# Patient Record
Sex: Female | Born: 1935 | Race: White | Hispanic: No | Smoking: Former smoker
Health system: Southern US, Community
[De-identification: ages and names within clinical notes are randomized; demographics above are authoritative.]

## PROBLEM LIST (undated history)

## (undated) DIAGNOSIS — F419 Anxiety disorder, unspecified: Secondary | ICD-10-CM

## (undated) DIAGNOSIS — I10 Essential (primary) hypertension: Secondary | ICD-10-CM

## (undated) DIAGNOSIS — E785 Hyperlipidemia, unspecified: Secondary | ICD-10-CM

## (undated) DIAGNOSIS — F0390 Unspecified dementia without behavioral disturbance: Secondary | ICD-10-CM

## (undated) DIAGNOSIS — E039 Hypothyroidism, unspecified: Secondary | ICD-10-CM

## (undated) DIAGNOSIS — G4700 Insomnia, unspecified: Secondary | ICD-10-CM

## (undated) DIAGNOSIS — K5909 Other constipation: Secondary | ICD-10-CM

## (undated) HISTORY — PX: TUBAL LIGATION: SHX77

## (undated) HISTORY — DX: Hypothyroidism, unspecified: E03.9

## (undated) HISTORY — DX: Anxiety disorder, unspecified: F41.9

## (undated) HISTORY — DX: Essential (primary) hypertension: I10

## (undated) HISTORY — PX: APPENDECTOMY: SHX54

## (undated) HISTORY — DX: Hyperlipidemia, unspecified: E78.5

## (undated) HISTORY — DX: Other constipation: K59.09

## (undated) HISTORY — DX: Insomnia, unspecified: G47.00

---

## 2003-02-17 ENCOUNTER — Encounter: Payer: Self-pay | Admitting: Family Medicine

## 2003-02-17 ENCOUNTER — Ambulatory Visit (HOSPITAL_COMMUNITY): Admission: RE | Admit: 2003-02-17 | Discharge: 2003-02-17 | Payer: Self-pay | Admitting: Family Medicine

## 2003-03-15 ENCOUNTER — Encounter: Payer: Self-pay | Admitting: Family Medicine

## 2003-03-15 ENCOUNTER — Ambulatory Visit (HOSPITAL_COMMUNITY): Admission: RE | Admit: 2003-03-15 | Discharge: 2003-03-15 | Payer: Self-pay | Admitting: Family Medicine

## 2003-04-16 ENCOUNTER — Ambulatory Visit (HOSPITAL_COMMUNITY): Admission: RE | Admit: 2003-04-16 | Discharge: 2003-04-16 | Payer: Self-pay | Admitting: Family Medicine

## 2003-04-16 ENCOUNTER — Encounter: Payer: Self-pay | Admitting: Family Medicine

## 2005-10-08 HISTORY — PX: OTHER SURGICAL HISTORY: SHX169

## 2005-10-08 HISTORY — PX: COLONOSCOPY: SHX174

## 2006-06-13 ENCOUNTER — Ambulatory Visit (HOSPITAL_COMMUNITY): Admission: RE | Admit: 2006-06-13 | Discharge: 2006-06-13 | Payer: Self-pay | Admitting: Family Medicine

## 2006-07-30 ENCOUNTER — Ambulatory Visit: Payer: Self-pay | Admitting: Internal Medicine

## 2006-07-30 ENCOUNTER — Ambulatory Visit (HOSPITAL_COMMUNITY): Admission: RE | Admit: 2006-07-30 | Discharge: 2006-07-30 | Payer: Self-pay | Admitting: Internal Medicine

## 2006-07-31 ENCOUNTER — Emergency Department (HOSPITAL_COMMUNITY): Admission: EM | Admit: 2006-07-31 | Discharge: 2006-07-31 | Payer: Self-pay | Admitting: Emergency Medicine

## 2006-09-16 ENCOUNTER — Ambulatory Visit (HOSPITAL_COMMUNITY): Admission: RE | Admit: 2006-09-16 | Discharge: 2006-09-16 | Payer: Self-pay | Admitting: Family Medicine

## 2006-09-16 ENCOUNTER — Emergency Department (HOSPITAL_COMMUNITY): Admission: EM | Admit: 2006-09-16 | Discharge: 2006-09-17 | Payer: Self-pay | Admitting: Emergency Medicine

## 2006-10-08 HISTORY — PX: PORT-A-CATH REMOVAL: SHX5289

## 2006-11-21 ENCOUNTER — Ambulatory Visit: Payer: Self-pay | Admitting: Orthopedic Surgery

## 2006-11-26 ENCOUNTER — Encounter (HOSPITAL_COMMUNITY): Admission: RE | Admit: 2006-11-26 | Discharge: 2006-12-26 | Payer: Self-pay | Admitting: Orthopedic Surgery

## 2006-12-02 ENCOUNTER — Ambulatory Visit: Payer: Self-pay | Admitting: Orthopedic Surgery

## 2006-12-06 ENCOUNTER — Ambulatory Visit: Payer: Self-pay | Admitting: Orthopedic Surgery

## 2006-12-06 ENCOUNTER — Ambulatory Visit (HOSPITAL_COMMUNITY): Admission: RE | Admit: 2006-12-06 | Discharge: 2006-12-06 | Payer: Self-pay | Admitting: Orthopedic Surgery

## 2006-12-07 ENCOUNTER — Encounter (HOSPITAL_COMMUNITY): Admission: RE | Admit: 2006-12-07 | Discharge: 2007-01-06 | Payer: Self-pay | Admitting: Orthopedic Surgery

## 2006-12-07 ENCOUNTER — Ambulatory Visit (HOSPITAL_COMMUNITY): Payer: Self-pay | Admitting: Orthopedic Surgery

## 2006-12-10 ENCOUNTER — Ambulatory Visit: Payer: Self-pay | Admitting: Orthopedic Surgery

## 2006-12-11 ENCOUNTER — Encounter (HOSPITAL_COMMUNITY): Admission: RE | Admit: 2006-12-11 | Discharge: 2007-01-10 | Payer: Self-pay | Admitting: Orthopedic Surgery

## 2006-12-17 ENCOUNTER — Ambulatory Visit: Payer: Self-pay | Admitting: Orthopedic Surgery

## 2006-12-24 ENCOUNTER — Ambulatory Visit: Payer: Self-pay | Admitting: Orthopedic Surgery

## 2007-01-07 ENCOUNTER — Encounter (HOSPITAL_COMMUNITY): Admission: RE | Admit: 2007-01-07 | Discharge: 2007-02-06 | Payer: Self-pay | Admitting: Orthopedic Surgery

## 2007-01-08 ENCOUNTER — Ambulatory Visit: Payer: Self-pay | Admitting: Orthopedic Surgery

## 2007-01-13 ENCOUNTER — Encounter (HOSPITAL_COMMUNITY): Admission: RE | Admit: 2007-01-13 | Discharge: 2007-02-12 | Payer: Self-pay | Admitting: Orthopedic Surgery

## 2007-01-22 ENCOUNTER — Ambulatory Visit: Payer: Self-pay | Admitting: Orthopedic Surgery

## 2007-02-13 ENCOUNTER — Ambulatory Visit: Payer: Self-pay | Admitting: Orthopedic Surgery

## 2007-04-02 ENCOUNTER — Ambulatory Visit: Payer: Self-pay | Admitting: Orthopedic Surgery

## 2007-04-09 ENCOUNTER — Ambulatory Visit (HOSPITAL_COMMUNITY): Admission: RE | Admit: 2007-04-09 | Discharge: 2007-04-09 | Payer: Self-pay | Admitting: General Surgery

## 2009-01-27 ENCOUNTER — Encounter (INDEPENDENT_AMBULATORY_CARE_PROVIDER_SITE_OTHER): Payer: Self-pay | Admitting: *Deleted

## 2009-02-14 DIAGNOSIS — I10 Essential (primary) hypertension: Secondary | ICD-10-CM | POA: Insufficient documentation

## 2009-02-14 DIAGNOSIS — E785 Hyperlipidemia, unspecified: Secondary | ICD-10-CM

## 2009-02-15 ENCOUNTER — Encounter: Payer: Self-pay | Admitting: Gastroenterology

## 2009-02-15 ENCOUNTER — Ambulatory Visit: Payer: Self-pay | Admitting: Internal Medicine

## 2009-02-15 DIAGNOSIS — K5900 Constipation, unspecified: Secondary | ICD-10-CM | POA: Insufficient documentation

## 2010-06-26 ENCOUNTER — Ambulatory Visit (HOSPITAL_COMMUNITY): Admission: RE | Admit: 2010-06-26 | Discharge: 2010-06-26 | Payer: Self-pay | Admitting: Internal Medicine

## 2010-09-21 ENCOUNTER — Ambulatory Visit (HOSPITAL_COMMUNITY)
Admission: RE | Admit: 2010-09-21 | Discharge: 2010-09-21 | Payer: Self-pay | Source: Home / Self Care | Attending: Internal Medicine | Admitting: Internal Medicine

## 2010-09-21 ENCOUNTER — Encounter (INDEPENDENT_AMBULATORY_CARE_PROVIDER_SITE_OTHER): Payer: Self-pay | Admitting: Internal Medicine

## 2010-12-21 ENCOUNTER — Ambulatory Visit (INDEPENDENT_AMBULATORY_CARE_PROVIDER_SITE_OTHER): Payer: Medicare HMO | Admitting: Otolaryngology

## 2010-12-21 DIAGNOSIS — R490 Dysphonia: Secondary | ICD-10-CM

## 2011-01-18 ENCOUNTER — Ambulatory Visit (INDEPENDENT_AMBULATORY_CARE_PROVIDER_SITE_OTHER): Payer: Medicare HMO | Admitting: Otolaryngology

## 2011-01-18 DIAGNOSIS — R490 Dysphonia: Secondary | ICD-10-CM

## 2011-02-20 NOTE — Op Note (Signed)
NAMEAIREN, Robin Grant NO.:  [id number]   MEDICAL RECORD NO.:  [id number]          PATIENT TYPE:  AMB   LOCATION:  DAY                           FACILITY:  [facility]   PHYSICIAN:  Barbaraann Barthel, M.D. DATE OF BIRTH:  Feb 22, 1931   DATE OF PROCEDURE:  04/09/2007  DATE OF DISCHARGE:                               OPERATIVE REPORT   PROCEDURE:  Removal of right subclavian vein Port-A-Cath.   NOTE:  This is a 77 year old white female who had undergone placement of  a Port-A-Cath for treatment of osteoarthritis of the right ankle.  She  has finished this treatment, and she was referred back for the removal  of Port-A-Cath.   SPECIMEN:  Port-A-Cath.   GROSS OPERATIVE FINDINGS:  Normal pseudocapsule around the Port-A-Cath,  no infection.   TECHNIQUE:  The patient was placed in a supine position, and after  prepping the right hemithorax with Betadine solution and draping in the  usual manner, 1% Xylocaine without epinephrine was used to anesthetize  the area around Port-A-Cath.  Skin and subcutaneous tissue was incised  Port-A-Cath was removed through the incision, clamping the capsule  around the Silastic catheter and ligating this with 4-0 Polysorb.  We  then irrigated with sterile Xylocaine and then closed the skin with 4-0  Polysorb in a subcuticular fashion.  The 1/8-inch Steri-Strips were used  to approximate the skin with a 2 x 2 and Neosporin and an OpSite  dressing applied.  Prior to closure, all sponge, needles and instrument  counts were found to be correct.  Estimated blood loss was minimal.  The  patient is to follow-up with [place] in the office in the morning.  There  were no complications.   It should be noted the patient was given Cipro preoperatively.  She had  a little rash, and this was discontinued.  There were no respiratory  problems or other untoward problems.  She was given Benadryl.  We will  follow up with her in the morning.      Barbaraann Barthel, M.D.  Electronically Signed     WB/MEDQ  D:  04/09/2007  T:  04/09/2007  Job:  [id number]   cc:   Barbaraann Barthel, M.D.  Fax: [phone]   Vickki Hearing, M.D.  Fax: [phone]

## 2011-02-23 NOTE — Op Note (Signed)
Robin Grant, Robin Grant NO.:  [id number]   MEDICAL RECORD NO.:  [id number]          PATIENT TYPE:  AMB   LOCATION:  DAY                           FACILITY:  [facility]   PHYSICIAN:  Barbaraann Barthel, M.D. DATE OF BIRTH:  [birth year]   DATE OF PROCEDURE:  12/06/2006  DATE OF DISCHARGE:  12/06/2006                               OPERATIVE REPORT   COMMENT:  This is the second dictation of this operative summary;  apparently, the first dictation was lost in transcription.   SURGEON:  Barbaraann Barthel, M.D.   PREOPERATIVE DIAGNOSIS:  Osteomyelitis of right ankle.   POSTOPERATIVE DIAGNOSIS:  Osteomyelitis of right ankle.   PROCEDURE:  Placement of right subclavian Port-A-Cath under fluoroscopy.   NOTE:  This is a 77 year old white female who was referred from Dr.  Romeo Apple for placement of a Port-A-Cath for long-term antibiotic  treatments for the osteomyelitis of her right ankle.  We discussed  preoperatively the surgery with her, discussing complications not  limited to, but including bleeding, infection and pneumothorax and  catheter embolization.  Informed consent was obtained.   GROSS OPERATIVE FINDINGS:  The patient had a Port-A-Cath placed under  fluoroscopy via the right subclavian vein with good position of the  catheter at the end of the procedure with no pneumothorax on followup  chest x-ray.   TECHNIQUE:  The patient was placed in Trendelenburg position.  After  prepping the right hemithorax with Betadine solution and draping in the  usual manner, an area below her clavicle on the right side was  anesthetized with 1% Xylocaine without epinephrine.  An 18-gauge needle  was used to cannulate the right subclavian vein and a guidewire was  placed under fluoroscopy in good position.  After that, we placed over  the guidewire a vein dilator and the plastic sleeve was then placed  within the vein and then through that, the Silastic catheter was  threaded.  This  was in good position.  We did this under fluoroscopy.  The length of the catheter was then trimmed to an appropriate length to  leave the tip the catheter at the superior vena cava level.  We then  anesthetize the subcutaneous pocket and placed the Port-A-Cath infusion  device, which had been connected to the catheter, in its proper  position.  Prior to connecting, we had irrigated this with heparinized  saline solution and removed all air bubbles with appropriate aspiration.  The subcu pocket was irrigated with normal saline solution.  The  subcutaneous layer was closed with 4-0 Polysorb and the skin was  approximated with a subcuticular 5-0 Polysorb suture.  Steri-Strips and  Neosporin and an OpSite dressing were applied.  Prior to closure, all  sponge, needle and instrument counts were found to be correct.  Estimated blood loss was minimal.  The patient tolerated the procedure  well.  In the recovery room, a chest x-ray was performed and this  revealed the catheter in good position without the presence of a  pneumothorax.   This patient will be followed up by Dr. Romeo Apple for routine catheter  care, also with the specialty clinics for a routine catheter care and  antibiotic placement.  I will see this patient again perioperatively and  for catheter removal.      Barbaraann Barthel, M.D.  Electronically Signed     WB/MEDQ  D:  12/16/2006  T:  12/17/2006  Job:  [id number]   cc:   Vickki Hearing, M.D.  Fax: [phone]   [facility]

## 2011-02-23 NOTE — Op Note (Signed)
NAMEGENNETTE, SHADIX NO.:  [id number]   MEDICAL RECORD NO.:  [id number]          PATIENT TYPE:  AMB   LOCATION:  DAY                           FACILITY:  [facility]   PHYSICIAN:  R. Roetta Sessions, M.D. DATE OF BIRTH:  1930-12-30   DATE OF PROCEDURE:  07/30/2006  DATE OF DISCHARGE:                                 OPERATIVE REPORT   Screening colonoscopy.   INDICATIONS FOR PROCEDURE:  The patient is a 75-year lady referred at the  courtesy of Dr. Oval Linsey for colorectal cancer screening.  She has  no lower GI tract symptoms.  She has never had her lower GI tract imaged.  There is family history of colorectal placed.  Colonoscopy is now being  done.  This approach has been discussed with the patient at length.  Potential risks, benefits and alternatives have been reviewed, questions  answered.  She is agreeable. Please see documentation in the medical record.   PROCEDURE NOTE:  O2 saturation, blood pressure, pulse and respirations were  monitored throughout the entire procedure.  Conscious sedation Versed 3 mg  IV and Demerol 75 mg IV in divided doses.   INSTRUMENT:  Olympus video chip system.   FINDINGS:  Digital rectal exam revealed no abnormalities.   ENDOSCOPIC FINDINGS:  The prep was good.   Rectum:  Examination of the rectal mucosa including retroflexed view of anal  verge revealed no abnormalities.   Colon:  Colonic mucosa surveyed from rectosigmoid junction to the left,  transverse, and right colon to the area of the appendiceal orifice,  ileocecal valve and cecum.  These structures were well seen and photographed  for the record.  From this level, the scope was withdrawn, and all  previously mucosal surfaces were again seen.  The patient had large-mouth  pan colonic diverticula.  The remainder colon mucosa appeared normal.  The  patient tolerated the procedure well and was reactive to endoscopy.   IMPRESSION:  1. Normal rectum.  2. Pan colonic  diverticula.  Colon mucosa appeared normal.   RECOMMENDATIONS:  1. Diverticulosis literature provided to Ms. Vanderwerf.  2. Daily fiber supplement recommended  3. Repeat screening colonoscopy in 10 years.      Jonathon Bellows, M.D.  Electronically Signed     RMR/MEDQ  D:  07/30/2006  T:  07/31/2006  Job:  [id number]   cc:   Melvyn Novas, MD  Fax: [phone]

## 2011-02-23 NOTE — Op Note (Signed)
[name], FREUNDLICH NO.:  [id number]   MEDICAL RECORD NO.:  [id number]          PATIENT TYPE:  AMB   LOCATION:  DAY                           FACILITY:  [facility]   PHYSICIAN:  Vickki Hearing, M.D.DATE OF BIRTH:  Mar 24, 1931   DATE OF PROCEDURE:  12/06/2006  DATE OF DISCHARGE:                               OPERATIVE REPORT   PREOPERATIVE DIAGNOSIS:  Osteomyelitis right fibula.   POSTOPERATIVE DIAGNOSIS:  Osteomyelitis right fibula.   PROCEDURE:  Incision, drainage, irrigation, debridement, bone biopsy,  bone culture and regular wound culture, right fibula.   SURGEON:  Vickki Hearing, M.D., no assistant.   ANESTHETIC:  General.   OPERATIVE FINDINGS:  No purulent drainage, open wound over the fibula  which was exposed to air, which is infection by definition.   HISTORY:  77 year old female scraped the skin over the right fibula in  summary 2007, since that time had intermittent redness, swelling,  tenderness with the wound that would scab over, fall off and then open  back up.  She has tried two antibiotics, Keflex and sulfa which did not  clear this up and presented to me for evaluation on 02/14 at the request  a Oval Linsey, MD.  Workup included a bone scan which showed  chronic osteomyelitis of the fibula.  I recommendation she undergo  surgical treatment and IV antibiotics for 6 weeks.  Dr. Malvin Johns has  placed a right Port-A-Cath to assist [place] with that.   PROCEDURE:  The patient was identified as Robin Grant.  The right leg  was marked for surgery, countersigned by the surgeon.  She was taken to  the operating room where she had a Port-A-Cath placed.  After this, with  the patient under anesthesia, the right leg was prepped and draped in  sterile technique.  The time-out procedure was completed after sterile  draping.   Under no tourniquet, skin edges with of the open wound were debrided.  Incision was extended proximally and distally.   Subperiosteal dissection  exposed the remaining portion of the fibula.  The exposed portion of  bone was removed with an osteotome in the sagittal plane and sent for  culture.  Wound cultures were obtained.  Wound was irrigated with about  a liter and a half of saline.  The extensions of the wound were closed  with 3-0 nylon.  The open wound was left open and packed with wet-to-dry  dressing.  The foot was wrapped.  The patient was extubated and taken to  recovery room in stable condition.   POSTOP PLAN:  The patient is to see Dr. Malvin Johns on Monday for at 10:00  a.m. for evaluation and postop visit for the placement of the catheter.  She will also see me on Monday for dressing change.  Cultures are  pending.  We will wait those cultures before we start definitive  antibiotics.  We will allow her to get IV ceftazidime starting over the  weekend.      Vickki Hearing, M.D.  Electronically Signed     SEH/MEDQ  D:  12/06/2006  T:  12/06/2006  Job:  [id number]

## 2011-05-02 ENCOUNTER — Ambulatory Visit: Payer: Medicare HMO | Admitting: Gastroenterology

## 2011-05-03 ENCOUNTER — Encounter: Payer: Self-pay | Admitting: Gastroenterology

## 2011-05-03 ENCOUNTER — Ambulatory Visit (INDEPENDENT_AMBULATORY_CARE_PROVIDER_SITE_OTHER): Payer: Medicare HMO | Admitting: Gastroenterology

## 2011-05-03 ENCOUNTER — Ambulatory Visit (HOSPITAL_COMMUNITY)
Admission: RE | Admit: 2011-05-03 | Discharge: 2011-05-03 | Disposition: A | Discharge status: Home / Self Care | Payer: Medicare HMO | Source: Ambulatory Visit | Attending: Gastroenterology | Admitting: Gastroenterology

## 2011-05-03 DIAGNOSIS — K5900 Constipation, unspecified: Secondary | ICD-10-CM

## 2011-05-03 DIAGNOSIS — K219 Gastro-esophageal reflux disease without esophagitis: Secondary | ICD-10-CM | POA: Insufficient documentation

## 2011-05-03 DIAGNOSIS — R109 Unspecified abdominal pain: Secondary | ICD-10-CM

## 2011-05-03 DIAGNOSIS — K5730 Diverticulosis of large intestine without perforation or abscess without bleeding: Secondary | ICD-10-CM | POA: Insufficient documentation

## 2011-05-03 DIAGNOSIS — R9389 Abnormal findings on diagnostic imaging of other specified body structures: Secondary | ICD-10-CM | POA: Insufficient documentation

## 2011-05-03 DIAGNOSIS — R1314 Dysphagia, pharyngoesophageal phase: Secondary | ICD-10-CM

## 2011-05-03 DIAGNOSIS — R1032 Left lower quadrant pain: Secondary | ICD-10-CM | POA: Insufficient documentation

## 2011-05-03 DIAGNOSIS — J387 Other diseases of larynx: Secondary | ICD-10-CM

## 2011-05-03 DIAGNOSIS — R1310 Dysphagia, unspecified: Secondary | ICD-10-CM | POA: Insufficient documentation

## 2011-05-03 LAB — COMPREHENSIVE METABOLIC PANEL
AST: 18 U/L (ref 0–37)
BUN: 20 mg/dL (ref 6–23)
Calcium: 10 mg/dL (ref 8.4–10.5)
Chloride: 103 mEq/L (ref 96–112)
Glucose, Bld: 93 mg/dL (ref 70–99)

## 2011-05-03 LAB — LIPASE: Lipase: 52 U/L (ref 11–59)

## 2011-05-03 LAB — CBC WITH DIFFERENTIAL/PLATELET
Eosinophils Relative: 3 % (ref 0–5)
Lymphocytes Relative: 31 % (ref 12–46)
Lymphs Abs: 1.7 10*3/uL (ref 0.7–4.0)
MCH: 32.1 pg (ref 26.0–34.0)
MCHC: 34.8 g/dL (ref 30.0–36.0)
MCV: 92.3 fL (ref 78.0–100.0)
Monocytes Absolute: 0.6 10*3/uL (ref 0.1–1.0)
Platelets: 228 10*3/uL (ref 150–400)

## 2011-05-03 MED ORDER — IOHEXOL 300 MG/ML  SOLN
80.0000 mL | Freq: Once | INTRAMUSCULAR | Status: AC | PRN
  Administered 2011-05-03: 80 mL via INTRAVENOUS

## 2011-05-03 NOTE — Assessment & Plan Note (Signed)
Refer to assessment and plan for LPR.

## 2011-05-03 NOTE — Assessment & Plan Note (Addendum)
ENT findings suggestive of LPR. Major complaint of hoarseness. No improvement on omeprazole 20 mg twice a day. She may benefit from different PPI, stronger dose. Continue omeprazole until EGD. She also has esophageal dysphagia. Recommend EGD with possible dilation in the near future. Will await workup of abdominal pain prior to scheduling.

## 2011-05-03 NOTE — Assessment & Plan Note (Signed)
Resume MiraLax 17 g daily.

## 2011-05-03 NOTE — Progress Notes (Signed)
Primary Care Physician:  [name], MD  Primary Gastroenterologist: Roetta Sessions, MD    Chief Complaint  Patient presents with  . Nausea  . Gastrophageal Reflux  . Abdominal Pain    HPI:  Robin Grant is a 77 y.o. female here for further evaluation of GERD/LPR. Diagnosed with LPR by Dr. Suszanne Conners in 12/2010.  On laryngoscopy, she had severely edematous arytenoid mucosa, severe edema of posterior commissure, true vocal folds were pale and severely edematous and diffuse erythema of posterior larynx. Started on omeprazole 20mg  po bid. Follow-up laryngoscopy four weeks later essentially unchanged. She also c/o intermittent nausea for years. Associated with dizziness. Meclinzine helps.  C/O intermittent dysphagia to liquids, solids, pills. Has to swallow twice to get it down.  Mostly concerned about left abdominal pain which radiates into back. Present for last couple months, hurts constantly. Last night pain severe. Today, hurt in car when hit a bump. No dysuria or hematuria. Has urgency to urinate but then trouble going.  Chronic constipation. Take occasional stool softner, twice per week. Recently stools more loose. BM 2-3 per week. No melena, brbpr.    Current Outpatient Prescriptions  Medication Sig Dispense Refill  . acetaminophen (TYLENOL) 325 MG tablet Take 650 mg by mouth every 6 (six) hours as needed.        Marland Kitchen aspirin 81 MG tablet Take 81 mg by mouth daily.        Marland Kitchen levothyroxine (SYNTHROID, LEVOTHROID) 50 MCG tablet Take 50 mcg by mouth daily.        Marland Kitchen losartan (COZAAR) 100 MG tablet Take 100 mg by mouth daily.        . Multiple Vitamin (MULTIVITAMIN) capsule Take 1 capsule by mouth daily.        Marland Kitchen omeprazole (PRILOSEC) 20 MG capsule Take 20 mg by mouth 2 (two) times daily.       . rosuvastatin (CRESTOR) 10 MG tablet Take 10 mg by mouth daily.        Marland Kitchen olmesartan (BENICAR) 40 MG tablet Take 40 mg by mouth daily.        . polyethylene glycol (MIRALAX / GLYCOLAX) packet Take 17 g by mouth  daily.          Allergies as of 05/03/2011 - Review Complete 05/03/2011  Allergen Reaction Noted  . Hydrocodone  05/03/2011    Past Medical History  Diagnosis Date  . Hyperlipidemia   . HTN (hypertension)   . Chronic constipation   . Anxiety   . Hypothyroidism   . Insomnia     Past Surgical History  Procedure Date  . Tubal ligation   . Appendectomy   . Colonoscopy 2007    Dr. [name], pancolonic diverticula.  . Right ankle surgery 2007    osteomyelitis  . Port-a-cath removal 2008    Family History  Problem Relation Age of Onset  . Breast cancer Mother     deceased from MI  . Colon cancer Maternal Aunt   . Stroke Father     MI too  . Rheumatic fever Sister 68    open heart surgery age 91, deceased age 26  . Lung cancer Sister   . Heart attack Sister   . Kidney disease Sister     born with one kidney, required transplant    History   Social History  . Marital Status: Married    Spouse Name: N/A    Number of Children: 2  . Years of Education: N/A   Occupational History  .  retired    Social History Main Topics  . Smoking status: Never Smoker   . Smokeless tobacco: Not on file  . Alcohol Use: No  . Drug Use: No  . Sexually Active: Not on file   Other Topics Concern  . Not on file   Social History Narrative  . No narrative on file      ROS:  General: Negative for anorexia, weight loss, fever, chills, fatigue, weakness. Eyes: Negative for vision changes.  ENT: Negative for nasal congestion. See HPI. CV: Negative for chest pain, angina, palpitations, dyspnea on exertion, peripheral edema.  Respiratory: Negative for dyspnea at rest, dyspnea on exertion, cough, sputum, wheezing.  GI: See history of present illness. GU:  See HPI.  MS: Negative for joint pain, low back pain.  Derm: Negative for rash or itching.  Neuro: Negative for weakness, abnormal sensation, seizure, frequent headaches, memory loss, confusion.  Psych: Negative for anxiety,  depression, suicidal ideation, hallucinations.  Endo: Negative for unusual weight change.  Heme: Negative for bruising or bleeding. Allergy: Negative for rash or hives.    Physical Examination:  BP 122/80  Pulse 84  Temp(Src) 97.6 F (36.4 C) (Temporal)  Ht 5\' 3"  (1.6 m)  Wt 124 lb 9.6 oz (56.518 kg)  BMI 22.07 kg/m2   General: Well-nourished, well-developed in no acute distress.  Head: Normocephalic, atraumatic.   Eyes: Conjunctiva pink, no icterus. Mouth: Oropharyngeal mucosa moist and pink , no lesions erythema or exudate. Neck: Supple without thyromegaly, masses, or lymphadenopathy.  Lungs: Clear to auscultation bilaterally.  Heart: Regular rate and rhythm, no murmurs rubs or gallops.  Abdomen: Bowel sounds are normal, nondistended, no hepatosplenomegaly or masses, no abdominal bruits or hernia. Left sided tenderness in LUQ down flank to LLQ without rebound or guarding. No CVA tenderness.  Rectal: Not performed Extremities: No lower extremity edema, no clubbing. Arthritic changes of hands.  Neuro: Alert and oriented x 4 , grossly normal neurologically.  Skin: Warm and dry, no rash or jaundice.   Psych: Alert and cooperative, normal mood and affect.  Labs: 10/2010: BUN 10, Creatinine 1.3.  Imaging Studies: No results found.

## 2011-05-03 NOTE — Progress Notes (Signed)
Cc to PCP 

## 2011-05-03 NOTE — Assessment & Plan Note (Signed)
Worsening left-sided abdominal pain. Etiology unclear. Stat labs and CT of abdomen and pelvis with IV and oral contrast. Radiology has been notified that the patient's last creatinine was 1.3 back in January 2012. Further recommendations to follow.

## 2011-05-07 NOTE — Progress Notes (Signed)
I called [facility] for an urgent appt.  They didn't have any appts in [place] so I had her scheduled to see Dr. Margarita Grizzle in [place] 05/08/2011@10 :00am.    Pt was also scheduled for egd 05/15/2011@10 :20am   Pt declined all these appts and stated she was feeling better if she changes her mind she will call back.   All called and cx all appts.

## 2011-05-07 NOTE — Progress Notes (Signed)
Cc Labs to PCP

## 2011-05-15 ENCOUNTER — Other Ambulatory Visit: Payer: Self-pay | Admitting: General Practice

## 2011-05-15 ENCOUNTER — Encounter: Payer: Medicare HMO | Admitting: Internal Medicine

## 2011-05-15 DIAGNOSIS — R109 Unspecified abdominal pain: Secondary | ICD-10-CM

## 2011-05-15 DIAGNOSIS — K219 Gastro-esophageal reflux disease without esophagitis: Secondary | ICD-10-CM

## 2011-05-21 ENCOUNTER — Ambulatory Visit (HOSPITAL_COMMUNITY)
Admission: RE | Admit: 2011-05-21 | Discharge: 2011-05-21 | Disposition: A | Discharge status: Home / Self Care | Payer: Medicare HMO | Source: Ambulatory Visit | Attending: Internal Medicine | Admitting: Internal Medicine

## 2011-05-21 ENCOUNTER — Encounter (HOSPITAL_COMMUNITY): Payer: Self-pay | Admitting: *Deleted

## 2011-05-21 ENCOUNTER — Encounter (HOSPITAL_COMMUNITY)
Admission: RE | Disposition: A | Discharge status: Home / Self Care | Payer: Self-pay | Source: Ambulatory Visit | Attending: Internal Medicine

## 2011-05-21 DIAGNOSIS — K219 Gastro-esophageal reflux disease without esophagitis: Secondary | ICD-10-CM

## 2011-05-21 DIAGNOSIS — R1032 Left lower quadrant pain: Secondary | ICD-10-CM | POA: Insufficient documentation

## 2011-05-21 DIAGNOSIS — Z01812 Encounter for preprocedural laboratory examination: Secondary | ICD-10-CM | POA: Insufficient documentation

## 2011-05-21 DIAGNOSIS — K449 Diaphragmatic hernia without obstruction or gangrene: Secondary | ICD-10-CM

## 2011-05-21 DIAGNOSIS — Z79899 Other long term (current) drug therapy: Secondary | ICD-10-CM | POA: Insufficient documentation

## 2011-05-21 DIAGNOSIS — K31819 Angiodysplasia of stomach and duodenum without bleeding: Secondary | ICD-10-CM

## 2011-05-21 DIAGNOSIS — K5900 Constipation, unspecified: Secondary | ICD-10-CM

## 2011-05-21 DIAGNOSIS — Z7982 Long term (current) use of aspirin: Secondary | ICD-10-CM | POA: Insufficient documentation

## 2011-05-21 DIAGNOSIS — R1310 Dysphagia, unspecified: Secondary | ICD-10-CM | POA: Insufficient documentation

## 2011-05-21 DIAGNOSIS — R109 Unspecified abdominal pain: Secondary | ICD-10-CM

## 2011-05-21 DIAGNOSIS — I10 Essential (primary) hypertension: Secondary | ICD-10-CM | POA: Insufficient documentation

## 2011-05-21 DIAGNOSIS — E785 Hyperlipidemia, unspecified: Secondary | ICD-10-CM | POA: Insufficient documentation

## 2011-05-21 HISTORY — PX: ESOPHAGOGASTRODUODENOSCOPY: SHX5428

## 2011-05-21 LAB — HEPATIC FUNCTION PANEL
ALT: 9 U/L (ref 0–35)
AST: 15 U/L (ref 0–37)
Albumin: 3.6 g/dL (ref 3.5–5.2)
Bilirubin, Direct: 0.2 mg/dL (ref 0.0–0.3)
Total Protein: 6.7 g/dL (ref 6.0–8.3)

## 2011-05-21 LAB — CREATININE, SERUM
GFR calc Af Amer: 52 mL/min — ABNORMAL LOW (ref >60)
GFR calc non Af Amer: 43 mL/min — ABNORMAL LOW (ref >60)

## 2011-05-21 LAB — BUN: BUN: 14 mg/dL (ref 6–23)

## 2011-05-21 SURGERY — EGD (ESOPHAGOGASTRODUODENOSCOPY)
Anesthesia: Moderate Sedation

## 2011-05-21 MED ORDER — BUTAMBEN-TETRACAINE-BENZOCAINE 2-2-14 % EX AERO
INHALATION_SPRAY | CUTANEOUS | Status: DC | PRN
  Administered 2011-05-21: 2 via TOPICAL

## 2011-05-21 MED ORDER — MEPERIDINE HCL 100 MG/ML IJ SOLN
INTRAMUSCULAR | Status: DC | PRN
  Administered 2011-05-21: 50 mg via INTRAVENOUS

## 2011-05-21 MED ORDER — SODIUM CHLORIDE 0.45 % IV SOLN
Freq: Once | INTRAVENOUS | Status: AC
  Administered 2011-05-21: 08:00:00 via INTRAVENOUS

## 2011-05-21 MED ORDER — MIDAZOLAM HCL 5 MG/5ML IJ SOLN
INTRAMUSCULAR | Status: DC | PRN
  Administered 2011-05-21: 2 mg via INTRAVENOUS

## 2011-05-21 MED ORDER — MIDAZOLAM HCL 5 MG/5ML IJ SOLN
INTRAMUSCULAR | Status: AC
  Filled 2011-05-21: qty 10

## 2011-05-21 MED ORDER — MEPERIDINE HCL 100 MG/ML IJ SOLN
INTRAMUSCULAR | Status: AC
  Filled 2011-05-21: qty 2

## 2011-05-21 NOTE — H&P (Addendum)
[name], PA  05/03/2011  3:38 PM  Signed Primary Care Physician:  [name], MD   Primary Gastroenterologist: Roetta Sessions, MD      Chief Complaint   Patient presents with   .  Nausea   .  Gastrophageal Reflux   .  Abdominal Pain      HPI:  Robin Grant is a 77 y.o. female here for further evaluation of GERD/LPR. Diagnosed with LPR by Dr. Suszanne Conners in 12/2010.  On laryngoscopy, she had severely edematous arytenoid mucosa, severe edema of posterior commissure, true vocal folds were pale and severely edematous and diffuse erythema of posterior larynx. Started on omeprazole 20mg  po bid. Follow-up laryngoscopy four weeks later essentially unchanged. She also c/o intermittent nausea for years. Associated with dizziness. Meclinzine helps.  C/O intermittent dysphagia to liquids, solids, pills. Has to swallow twice to get it down.   Mostly concerned about left abdominal pain which radiates into back. Present for last couple months, hurts constantly. Last night pain severe. Today, hurt in car when hit a bump. No dysuria or hematuria. Has urgency to urinate but then trouble going.  Chronic constipation. Take occasional stool softner, twice per week. Recently stools more loose. BM 2-3 per week. No melena, brbpr.       Current Outpatient Prescriptions   Medication  Sig  Dispense  Refill   .  acetaminophen (TYLENOL) 325 MG tablet  Take 650 mg by mouth every 6 (six) hours as needed.           Marland Kitchen  aspirin 81 MG tablet  Take 81 mg by mouth daily.           Marland Kitchen  levothyroxine (SYNTHROID, LEVOTHROID) 50 MCG tablet  Take 50 mcg by mouth daily.           Marland Kitchen  losartan (COZAAR) 100 MG tablet  Take 100 mg by mouth daily.           .  Multiple Vitamin (MULTIVITAMIN) capsule  Take 1 capsule by mouth daily.           Marland Kitchen  omeprazole (PRILOSEC) 20 MG capsule  Take 20 mg by mouth 2 (two) times daily.          .  rosuvastatin (CRESTOR) 10 MG tablet  Take 10 mg by mouth daily.           Marland Kitchen  olmesartan (BENICAR) 40 MG  tablet  Take 40 mg by mouth daily.           .  polyethylene glycol (MIRALAX / GLYCOLAX) packet  Take 17 g by mouth daily.               Allergies as of 05/03/2011 - Review Complete 05/03/2011   Allergen  Reaction  Noted   .  Hydrocodone    05/03/2011       Past Medical History   Diagnosis  Date   .  Hyperlipidemia     .  HTN (hypertension)     .  Chronic constipation     .  Anxiety     .  Hypothyroidism     .  Insomnia         Past Surgical History   Procedure  Date   .  Tubal ligation     .  Appendectomy     .  Colonoscopy  2007       Dr. [name], pancolonic diverticula.   .  Right ankle surgery  2007  osteomyelitis   .  Port-a-cath removal  2008       Family History   Problem  Relation  Age of Onset   .  Breast cancer  Mother         deceased from MI   .  Colon cancer  Maternal Aunt     .  Stroke  Father         MI too   .  Rheumatic fever  Sister  10       open heart surgery age 17, deceased age 21   .  Lung cancer  Sister     .  Heart attack  Sister     .  Kidney disease  Sister         born with one kidney, required transplant       History       Social History   .  Marital Status:  Married       Spouse Name:  N/A       Number of Children:  2   .  Years of Education:  N/A       Occupational History   .   retired         Social History Main Topics   .  Smoking status:  Never Smoker    .  Smokeless tobacco:  Not on file   .  Alcohol Use:  No   .  Drug Use:  No   .  Sexually Active:  Not on file       Other Topics  Concern   .  Not on file       Social History Narrative   .  No narrative on file        ROS:   General: Negative for anorexia, weight loss, fever, chills, fatigue, weakness. Eyes: Negative for vision changes.   ENT: Negative for nasal congestion. See HPI. CV: Negative for chest pain, angina, palpitations, dyspnea on exertion, peripheral edema.   Respiratory: Negative for dyspnea at rest, dyspnea on exertion, cough,  sputum, wheezing.   GI: See history of present illness. GU:  See HPI.   MS: Negative for joint pain, low back pain.   Derm: Negative for rash or itching.   Neuro: Negative for weakness, abnormal sensation, seizure, frequent headaches, memory loss, confusion.   Psych: Negative for anxiety, depression, suicidal ideation, hallucinations.   Endo: Negative for unusual weight change.   Heme: Negative for bruising or bleeding. Allergy: Negative for rash or hives.     Physical Examination:   BP 122/80  Pulse 84  Temp(Src) 97.6 F (36.4 C) (Temporal)  Ht 5\' 3"  (1.6 m)  Wt 124 lb 9.6 oz (56.518 kg)  BMI 22.07 kg/m2    General: Well-nourished, well-developed in no acute distress.   Head: Normocephalic, atraumatic.    Eyes: Conjunctiva pink, no icterus. Mouth: Oropharyngeal mucosa moist and pink , no lesions erythema or exudate. Neck: Supple without thyromegaly, masses, or lymphadenopathy.   Lungs: Clear to auscultation bilaterally.   Heart: Regular rate and rhythm, no murmurs rubs or gallops.   Abdomen: Bowel sounds are normal, nondistended, no hepatosplenomegaly or masses, no abdominal bruits or hernia. Left sided tenderness in LUQ down flank to LLQ without rebound or guarding. No CVA tenderness.   Rectal: Not performed Extremities: No lower extremity edema, no clubbing. Arthritic changes of hands.   Neuro: Alert and oriented x 4 , grossly normal neurologically.   Skin: Warm and  dry, no rash or jaundice.    Psych: Alert and cooperative, normal mood and affect.   Labs: 10/2010: BUN 10, Creatinine 1.3.   Imaging Studies: No results found.         [place] Score  05/03/2011  4:29 PM  Signed Cc to PCP  [place] Score  05/07/2011  9:18 AM  Signed Cc Labs to PCP        Left sided abdominal pain - [name], PA  05/03/2011  3:36 PM  Signed Worsening left-sided abdominal pain. Etiology unclear. Stat labs and CT of abdomen and pelvis with IV and oral contrast. Radiology has  been notified that the patient's last creatinine was 1.3 back in January 2012. Further recommendations to follow.  CONSTIPATION - [name], PA  05/03/2011  3:36 PM  Signed Resume MiraLax 17 g daily.  Laryngopharyngeal reflux (LPR) - [name], PA  05/03/2011  3:38 PM  Addendum ENT findings suggestive of LPR. Major complaint of hoarseness. No improvement on omeprazole 20 mg twice a day. She may benefit from different PPI, stronger dose. Continue omeprazole until EGD. She also has esophageal dysphagia. Recommend EGD with possible dilation in the near future. Will await workup of abdominal pain prior to scheduling.  Previous Version  Esophageal dysphagia - [name], PA  05/03/2011  3:37 PM  Signed Refer to assessment and plan for LPR.   I have seen the patient prior to the procedure(s) today and reviewed the history and physical / consultation from 05/03/11.   CT scan negative for anything acute. Diverticulosis but no diverticulitis. Abdominal pain has since resolved. Slightly elevated total bilirubin. Slightly elevated creatinine of 1.3 . Serum lipase normal. There have been no changes otherwise. After consideration of the risks, benefits, alternatives and imponderables, the patient has consented to the procedure(s). Risks, benefits, limitations, alternatives and imponderables have been reviewed with the patient. Potential for esophageal dilation, biopsy etc. Have also been reviewed.  Questions have been answered. All parties agreeable.

## 2011-05-25 ENCOUNTER — Encounter (HOSPITAL_COMMUNITY): Payer: Self-pay | Admitting: Internal Medicine

## 2011-06-21 ENCOUNTER — Ambulatory Visit (INDEPENDENT_AMBULATORY_CARE_PROVIDER_SITE_OTHER): Payer: Medicare HMO | Admitting: Otolaryngology

## 2011-06-21 DIAGNOSIS — K219 Gastro-esophageal reflux disease without esophagitis: Secondary | ICD-10-CM

## 2011-06-21 DIAGNOSIS — R490 Dysphonia: Secondary | ICD-10-CM

## 2011-07-19 ENCOUNTER — Ambulatory Visit (INDEPENDENT_AMBULATORY_CARE_PROVIDER_SITE_OTHER): Payer: Medicare HMO | Admitting: Otolaryngology

## 2011-07-19 DIAGNOSIS — H903 Sensorineural hearing loss, bilateral: Secondary | ICD-10-CM

## 2011-07-19 DIAGNOSIS — K219 Gastro-esophageal reflux disease without esophagitis: Secondary | ICD-10-CM

## 2011-07-19 DIAGNOSIS — R490 Dysphonia: Secondary | ICD-10-CM

## 2012-09-24 ENCOUNTER — Encounter: Payer: Self-pay | Admitting: Orthopedic Surgery

## 2012-09-24 ENCOUNTER — Ambulatory Visit (INDEPENDENT_AMBULATORY_CARE_PROVIDER_SITE_OTHER): Payer: Medicare Other | Admitting: Orthopedic Surgery

## 2012-09-24 VITALS — BP 140/60 | Ht 62.0 in | Wt 123.0 lb

## 2012-09-24 DIAGNOSIS — M6530 Trigger finger, unspecified finger: Secondary | ICD-10-CM

## 2012-09-24 NOTE — Patient Instructions (Addendum)
You have received a steroid shot. 15% of patients experience increased pain at the injection site with in the next 24 hours. This is best treated with ice and tylenol extra strength 2 tabs every 8 hours. If you are still having pain please call the office.    

## 2012-09-25 ENCOUNTER — Encounter: Payer: Self-pay | Admitting: Orthopedic Surgery

## 2012-09-25 NOTE — Progress Notes (Signed)
Patient ID: Robin Grant, female   DOB: 1930/12/06, 78 y.o.   MRN: [id number] Chief Complaint  Patient presents with  . Hand Pain    Right finger pain no injury started 2-3 weeks ago      78 years old approximately 2-3 weeks ago she had sudden onset of pain in her right hand associated with sharp 5/10 constant discomfort with locking and catching  Review of system history of redness constipation joint pain otherwise normal  Medical history reviewed and recorded  Exam reveals well-developed well-nourished female grooming and hygiene are intact she is oriented x3 mood and affect are normal she is ambulatory with a normal gait pattern  The finger involved on the right upper extremity shows tenderness over the A1 pulley with painful range of motion no instability normal strength normal skin normal pulse normal effusion no lymphadenopathy normal sensation no pathologic reflexes  Recommend injection for trigger finger  Procedure note trigger finger injection  Diagnosis trigger finger Postop diagnosis trigger finger Procedure injection of trigger finger  Details of procedure: After verbal consent and timeout to confirm site the involved digit was injected with Depo-Medrol 40 mg and lidocaine 1% 1 cc. Tolerated well without complication

## 2013-07-26 ENCOUNTER — Encounter (HOSPITAL_COMMUNITY): Payer: Self-pay | Admitting: Emergency Medicine

## 2013-07-26 ENCOUNTER — Inpatient Hospital Stay (HOSPITAL_COMMUNITY)
Admission: EM | Admit: 2013-07-26 | Discharge: 2013-07-30 | DRG: 378 | Disposition: A | Discharge status: Home / Self Care | Payer: Medicare Other | Attending: Internal Medicine | Admitting: Internal Medicine

## 2013-07-26 ENCOUNTER — Emergency Department (HOSPITAL_COMMUNITY): Payer: Medicare Other

## 2013-07-26 DIAGNOSIS — Z23 Encounter for immunization: Secondary | ICD-10-CM

## 2013-07-26 DIAGNOSIS — E039 Hypothyroidism, unspecified: Secondary | ICD-10-CM | POA: Diagnosis present

## 2013-07-26 DIAGNOSIS — I10 Essential (primary) hypertension: Secondary | ICD-10-CM

## 2013-07-26 DIAGNOSIS — K5790 Diverticulosis of intestine, part unspecified, without perforation or abscess without bleeding: Secondary | ICD-10-CM

## 2013-07-26 DIAGNOSIS — E876 Hypokalemia: Secondary | ICD-10-CM | POA: Diagnosis present

## 2013-07-26 DIAGNOSIS — Z79899 Other long term (current) drug therapy: Secondary | ICD-10-CM

## 2013-07-26 DIAGNOSIS — F3289 Other specified depressive episodes: Secondary | ICD-10-CM | POA: Diagnosis present

## 2013-07-26 DIAGNOSIS — I129 Hypertensive chronic kidney disease with stage 1 through stage 4 chronic kidney disease, or unspecified chronic kidney disease: Secondary | ICD-10-CM | POA: Diagnosis present

## 2013-07-26 DIAGNOSIS — F329 Major depressive disorder, single episode, unspecified: Secondary | ICD-10-CM | POA: Diagnosis present

## 2013-07-26 DIAGNOSIS — K922 Gastrointestinal hemorrhage, unspecified: Principal | ICD-10-CM | POA: Diagnosis present

## 2013-07-26 DIAGNOSIS — K5900 Constipation, unspecified: Secondary | ICD-10-CM | POA: Diagnosis present

## 2013-07-26 DIAGNOSIS — Z803 Family history of malignant neoplasm of breast: Secondary | ICD-10-CM

## 2013-07-26 DIAGNOSIS — K620 Anal polyp: Secondary | ICD-10-CM | POA: Diagnosis present

## 2013-07-26 DIAGNOSIS — R51 Headache: Secondary | ICD-10-CM | POA: Diagnosis present

## 2013-07-26 DIAGNOSIS — Z823 Family history of stroke: Secondary | ICD-10-CM

## 2013-07-26 DIAGNOSIS — F411 Generalized anxiety disorder: Secondary | ICD-10-CM | POA: Diagnosis present

## 2013-07-26 DIAGNOSIS — N183 Chronic kidney disease, stage 3 unspecified: Secondary | ICD-10-CM | POA: Diagnosis present

## 2013-07-26 DIAGNOSIS — Z800 Family history of malignant neoplasm of digestive organs: Secondary | ICD-10-CM

## 2013-07-26 DIAGNOSIS — Z66 Do not resuscitate: Secondary | ICD-10-CM | POA: Diagnosis present

## 2013-07-26 DIAGNOSIS — K625 Hemorrhage of anus and rectum: Secondary | ICD-10-CM

## 2013-07-26 DIAGNOSIS — Z801 Family history of malignant neoplasm of trachea, bronchus and lung: Secondary | ICD-10-CM

## 2013-07-26 DIAGNOSIS — E785 Hyperlipidemia, unspecified: Secondary | ICD-10-CM | POA: Diagnosis present

## 2013-07-26 DIAGNOSIS — D696 Thrombocytopenia, unspecified: Secondary | ICD-10-CM | POA: Diagnosis present

## 2013-07-26 DIAGNOSIS — Z8249 Family history of ischemic heart disease and other diseases of the circulatory system: Secondary | ICD-10-CM

## 2013-07-26 DIAGNOSIS — D62 Acute posthemorrhagic anemia: Secondary | ICD-10-CM | POA: Diagnosis present

## 2013-07-26 DIAGNOSIS — K5730 Diverticulosis of large intestine without perforation or abscess without bleeding: Secondary | ICD-10-CM | POA: Diagnosis present

## 2013-07-26 DIAGNOSIS — K2960 Other gastritis without bleeding: Secondary | ICD-10-CM | POA: Diagnosis present

## 2013-07-26 LAB — CBC WITH DIFFERENTIAL/PLATELET
Basophils Absolute: 0 10*3/uL (ref 0.0–0.1)
Basophils Relative: 0 % (ref 0–1)
HCT: 37.9 % (ref 36.0–46.0)
Hemoglobin: 13.2 g/dL (ref 12.0–15.0)
Lymphocytes Relative: 16 % (ref 12–46)
MCHC: 34.8 g/dL (ref 30.0–36.0)
Monocytes Absolute: 0.7 10*3/uL (ref 0.1–1.0)
Monocytes Relative: 9 % (ref 3–12)
Neutro Abs: 5.1 10*3/uL (ref 1.7–7.7)
Neutrophils Relative %: 69 % (ref 43–77)
RBC: 4.19 MIL/uL (ref 3.87–5.11)
WBC: 7.4 10*3/uL (ref 4.0–10.5)

## 2013-07-26 LAB — CBC
HCT: 29.4 % — ABNORMAL LOW (ref 36.0–46.0)
Hemoglobin: 10.2 g/dL — ABNORMAL LOW (ref 12.0–15.0)
MCH: 31.1 pg (ref 26.0–34.0)
MCH: 31.2 pg (ref 26.0–34.0)
MCHC: 34.3 g/dL (ref 30.0–36.0)
MCHC: 34 g/dL (ref 30.0–36.0)
MCHC: 34.5 g/dL (ref 30.0–36.0)
MCV: 90.5 fL (ref 78.0–100.0)
MCV: 91 fL (ref 78.0–100.0)
Platelets: 191 10*3/uL (ref 150–400)
Platelets: 237 10*3/uL (ref 150–400)
Platelets: 198 10*3/uL (ref 150–400)
RBC: 3.28 MIL/uL — ABNORMAL LOW (ref 3.87–5.11)
RBC: 2.95 MIL/uL — ABNORMAL LOW (ref 3.87–5.11)
RDW: 12.9 % (ref 11.5–15.5)
WBC: 5.7 10*3/uL (ref 4.0–10.5)

## 2013-07-26 LAB — PREPARE RBC (CROSSMATCH)

## 2013-07-26 LAB — BASIC METABOLIC PANEL
BUN: 25 mg/dL — ABNORMAL HIGH (ref 6–23)
CO2: 26 mEq/L (ref 19–32)
Chloride: 100 mEq/L (ref 96–112)
GFR calc Af Amer: 38 mL/min — ABNORMAL LOW (ref >90)
Potassium: 3.1 mEq/L — ABNORMAL LOW (ref 3.5–5.1)

## 2013-07-26 LAB — MRSA PCR SCREENING: MRSA by PCR: NEGATIVE

## 2013-07-26 LAB — OCCULT BLOOD, POC DEVICE: Fecal Occult Bld: POSITIVE — AB

## 2013-07-26 MED ORDER — SODIUM CHLORIDE 0.9 % IV SOLN: 250.0000 mL | INTRAVENOUS | Status: DC | PRN

## 2013-07-26 MED ORDER — SODIUM CHLORIDE 0.9 % IJ SOLN
3.0000 mL | Freq: Two times a day (BID) | INTRAMUSCULAR | Status: DC
  Administered 2013-07-26 – 2013-07-30 (×5): 3 mL via INTRAVENOUS

## 2013-07-26 MED ORDER — POTASSIUM CHLORIDE CRYS ER 20 MEQ PO TBCR
40.0000 meq | EXTENDED_RELEASE_TABLET | Freq: Once | ORAL | Status: AC
  Administered 2013-07-26: 40 meq via ORAL
  Filled 2013-07-26: qty 2

## 2013-07-26 MED ORDER — LEVOTHYROXINE SODIUM 50 MCG PO TABS
50.0000 ug | ORAL_TABLET | Freq: Every day | ORAL | Status: DC
  Administered 2013-07-26 – 2013-07-30 (×4): 50 ug via ORAL
  Filled 2013-07-26 (×3): qty 2
  Filled 2013-07-26: qty 1

## 2013-07-26 MED ORDER — ONDANSETRON HCL 4 MG/2ML IJ SOLN
4.0000 mg | Freq: Four times a day (QID) | INTRAMUSCULAR | Status: DC | PRN
  Administered 2013-07-26 – 2013-07-27 (×2): 4 mg via INTRAVENOUS
  Filled 2013-07-26 (×2): qty 2

## 2013-07-26 MED ORDER — ONDANSETRON HCL 4 MG PO TABS: 4.0000 mg | ORAL_TABLET | Freq: Four times a day (QID) | ORAL | Status: DC | PRN

## 2013-07-26 MED ORDER — ACETAMINOPHEN 650 MG RE SUPP: 650.0000 mg | Freq: Four times a day (QID) | RECTAL | Status: DC | PRN

## 2013-07-26 MED ORDER — INFLUENZA VAC SPLIT QUAD 0.5 ML IM SUSP
0.5000 mL | INTRAMUSCULAR | Status: AC
  Administered 2013-07-27: 0.5 mL via INTRAMUSCULAR
  Filled 2013-07-26: qty 0.5

## 2013-07-26 MED ORDER — ONDANSETRON HCL 4 MG/2ML IJ SOLN
4.0000 mg | Freq: Once | INTRAMUSCULAR | Status: DC
  Filled 2013-07-26: qty 2

## 2013-07-26 MED ORDER — SERTRALINE HCL 50 MG PO TABS
100.0000 mg | ORAL_TABLET | Freq: Every day | ORAL | Status: DC
  Administered 2013-07-26 – 2013-07-30 (×4): 100 mg via ORAL
  Filled 2013-07-26 (×3): qty 2

## 2013-07-26 MED ORDER — SODIUM CHLORIDE 0.9 % IJ SOLN: 3.0000 mL | INTRAMUSCULAR | Status: DC | PRN

## 2013-07-26 MED ORDER — SODIUM CHLORIDE 0.9 % IV BOLUS (SEPSIS)
500.0000 mL | Freq: Once | INTRAVENOUS | Status: AC
  Administered 2013-07-26: 500 mL via INTRAVENOUS

## 2013-07-26 MED ORDER — ACETAMINOPHEN 325 MG PO TABS: 650.0000 mg | ORAL_TABLET | Freq: Four times a day (QID) | ORAL | Status: DC | PRN

## 2013-07-26 MED ORDER — PANTOPRAZOLE SODIUM 40 MG PO TBEC
40.0000 mg | DELAYED_RELEASE_TABLET | Freq: Two times a day (BID) | ORAL | Status: DC
  Administered 2013-07-26 – 2013-07-27 (×3): 40 mg via ORAL
  Filled 2013-07-26 (×3): qty 1

## 2013-07-26 MED ORDER — SODIUM CHLORIDE 0.9 % IJ SOLN: 3.0000 mL | Freq: Two times a day (BID) | INTRAMUSCULAR | Status: DC

## 2013-07-26 MED ORDER — ONDANSETRON HCL 4 MG/2ML IJ SOLN
4.0000 mg | Freq: Once | INTRAMUSCULAR | Status: AC
  Administered 2013-07-26: 4 mg via INTRAVENOUS

## 2013-07-26 NOTE — ED Notes (Signed)
Pt states her headache and shoulder pain have subsided

## 2013-07-26 NOTE — H&P (Signed)
History and Physical  Robin Grant WUJ:[id number] DOB: 1931-04-10 DOA: 07/26/2013  Referring physician: Dr. [name] in ED PCP: Robin Perches, MD   Chief Complaint: bleeding  HPI:  79 year old woman on low-dose aspirin presents the emergency department with history of sudden onset of painless rectal bleeding. Initial evaluation was notable for grossly heme positive stool. Initial hemodynamics and CBC were within normal limits.  History obtained from patient and 2 sons at bedside. She has never had GI bleeding before. She did have a colonoscopy in 2007 which revealed pan colonic diverticulosis. This morning she developed a sudden urge to go the bathroom, noticed bright red blood with stool, fairly large amount. She has no abdominal pain and no other complaints currently. No specific aggravating or alleviating factors. She came to the emergency department for further evaluation. She is generally felt well but over the last week she has had multiple episodes of vomiting without blood.  In the emergency department afebrile, vital signs stable. Laboratory studies notable for mild hypokalemia, BUN 25, creatinine 1.46. Hemoglobin 13.2. Platelets normal. EKG not acute, acute abdominal series negative. Treated with antiemetics and referred for admission.  Review of Systems:  Negative for fever, visual changes, sore throat, rash, new muscle aches, chest pain, SOB, dysuria, abdominal pain.  Past Medical History  Diagnosis Date  . Hyperlipidemia   . HTN (hypertension)   . Chronic constipation   . Anxiety   . Hypothyroidism   . Insomnia     Past Surgical History  Procedure Laterality Date  . Tubal ligation    . Appendectomy    . Colonoscopy  2007    Dr. Jena Grant, pancolonic diverticula.  . Right ankle surgery  2007    osteomyelitis  . Port-a-cath removal  2008  . Esophagogastroduodenoscopy  05/21/2011    Procedure: ESOPHAGOGASTRODUODENOSCOPY (EGD);  Surgeon: [name], MD;  Location: [facility];  Service: Endoscopy;  Laterality: N/A;  8:15AM    Social History:  reports that she has never smoked. She does not have any smokeless tobacco history on file. She reports that she does not drink alcohol or use illicit drugs.  Allergies  Allergen Reactions  . Hydrocodone Nausea And Vomiting    Family History  Problem Relation Age of Onset  . Breast cancer Mother     deceased from MI  . Colon cancer Maternal Aunt   . Stroke Father     MI too  . Rheumatic fever Sister 68    open heart surgery age 46, deceased age 31  . Lung cancer Sister   . Heart attack Sister   . Kidney disease Sister     born with one kidney, required transplant     Prior to Admission medications   Medication Sig Start Date End Date Taking? Authorizing Provider  acetaminophen (TYLENOL) 325 MG tablet Take 650 mg by mouth every 6 (six) hours as needed.      Historical Provider, MD  aspirin 81 MG tablet Take 81 mg by mouth daily.      Historical Provider, MD  levothyroxine (SYNTHROID, LEVOTHROID) 50 MCG tablet Take 50 mcg by mouth daily.      Historical Provider, MD  losartan (COZAAR) 100 MG tablet Take 100 mg by mouth daily.      Historical Provider, MD  Multiple Vitamin (MULTIVITAMIN) capsule Take 1 capsule by mouth daily.      Historical Provider, MD  olmesartan (BENICAR) 40 MG tablet Take 40 mg by mouth daily.  Historical Provider, MD  omeprazole (PRILOSEC) 20 MG capsule Take 20 mg by mouth 2 (two) times daily.     Historical Provider, MD  polyethylene glycol (MIRALAX / GLYCOLAX) packet Take 17 g by mouth daily.      Historical Provider, MD  rosuvastatin (CRESTOR) 10 MG tablet Take 10 mg by mouth daily.      Historical Provider, MD  sertraline (ZOLOFT) 100 MG tablet Take 100 mg by mouth daily.    Historical Provider, MD   Physical Exam: Filed Vitals:   07/26/13 0934 07/26/13 1136  BP: 132/73 106/50  Pulse: 90 68  Temp: 97.6 F (36.4 C)   TempSrc: Oral   Resp: 18 18  Height: 5\' 2"  (1.575 m)    Weight: 55.792 kg (123 lb)   SpO2: 100% 99%    General: Examined in the emergency department. Appears calm and comfortable Eyes: Pupils, irises, lids appear grossly normal ENT: grossly normal hearing, lips & tongue Neck: no LAD, masses or thyromegaly Cardiovascular: RRR, no m/r/g. No LE edema. Respiratory: CTA bilaterally, no w/r/r. Normal respiratory effort. Abdomen: soft, ntnd Skin: no rash or induration seen . Right lower extremity wound proximally 2 cm, oval in shape without evidence of infection. Musculoskeletal: grossly normal tone BUE/BLE Psychiatric: grossly normal mood and affect, speech fluent and appropriate Neurologic: grossly non-focal.  Wt Readings from Last 3 Encounters:  07/26/13 55.792 kg (123 lb)  09/24/12 55.792 kg (123 lb)  05/21/11 56.246 kg (124 lb)    Labs on Admission:  Basic Metabolic Panel:  Recent Labs Lab 07/26/13 0937  NA 135  K 3.1*  CL 100  CO2 26  GLUCOSE 101*  BUN 25*  CREATININE 1.46*  CALCIUM 9.9    CBC:  Recent Labs Lab 07/26/13 0937  WBC 7.4  NEUTROABS 5.1  HGB 13.2  HCT 37.9  MCV 90.5  PLT 263    Radiological Exams on Admission: Dg Abd Acute W/chest  07/26/2013   CLINICAL DATA:  Hypertension  EXAM: ACUTE ABDOMEN SERIES (ABDOMEN 2 VIEW & CHEST 1 VIEW)  COMPARISON:  05/03/2011  FINDINGS: Normal heart size. Lungs are hyper aerated and clear.  No free intraperitoneal gas. No disproportionate dilatation of bowel. Prominent stool throughout the colon. Round peripherally calcified density over the pelvis is consistent with patient's known calcified lipoma.  IMPRESSION: No active cardiopulmonary disease. Nonobstructive bowel gas pattern.   Electronically Signed   By: Maryclare Bean M.D.   On: 07/26/2013 11:13    EKG: Independently reviewed. NSR, LAD, no acute changes.   Principal Problem:   Rectal bleeding Active Problems:   Diverticulosis   Hypertension   Chronic kidney disease, stage III  (moderate)   Assessment/Plan 1. Rectal bleeding: Serial CBC. Monitor hemodynamics, hold aspirin. Consult gastroenterology. Suspect diverticular. She does have a history of pandiverticulosis by CT and colonoscopy 2007.  2. Hypokalemia: Replete. 3. CKD stage III: appears very near baseline 4. HTN: Monitor clinically. Hold losartan, Benicar for now. 5. Hypothyroidism: Continue replacement therapy.  Code Status: DNR DVT prophylaxis: SCDs Family Communication: Discussed with 2 sons at bedside. We reviewed results of imaging and laboratory studies. We discussed current impression and plan. Disposition Plan/Anticipated LOS: admit, 2-3 days  Time spent: 50 minutes  Brendia Sacks, MD  [facility] Pager [phone] 07/26/2013, 12:02 PM

## 2013-07-26 NOTE — ED Notes (Signed)
Pt reports woke up this am and felt like had to have a bm.  Reports was incontinent of stool before she could get to the bathroom and it had bright red blood in stool.  Reports has had intermittent nausea for " a while."  Denies abd pain.

## 2013-07-26 NOTE — ED Notes (Signed)
Pt c/o nausea, meds ordered and given, pt alert, multiple family members at bedside. Requested ice chips, small cup given.  Warm blankets given.

## 2013-07-26 NOTE — ED Provider Notes (Signed)
CSN: [id number]     Arrival date & time 07/26/13  4098 History  This chart was scribed for Donnetta Hutching, MD by Karle Plumber, ED Scribe. This patient was seen in room [facility] and the patient's care was started at 9:49 AM.  Chief Complaint  Patient presents with  . GI Bleeding   HPI HPI Comments:   Level V caveat  for urgent intervention Robin Grant is a 79 y.o. female who presents to the Emergency Department complaining of severe rectal bleeding onset 3 hours ago. She reports associated headache, left shoulder pain and generalized weakness. Pt states she has never experienced rectal bleeding in the past. Pt denies any abdominal pain or pain anywhere else. Pt states her PCP is Dr. Ouida Sills in [place].    Past Medical History  Diagnosis Date  . Hyperlipidemia   . HTN (hypertension)   . Chronic constipation   . Anxiety   . Hypothyroidism   . Insomnia    Past Surgical History  Procedure Laterality Date  . Tubal ligation    . Appendectomy    . Colonoscopy  2007    Dr. [name], pancolonic diverticula.  . Right ankle surgery  2007    osteomyelitis  . Port-a-cath removal  2008  . Esophagogastroduodenoscopy  05/21/2011    Procedure: ESOPHAGOGASTRODUODENOSCOPY (EGD);  Surgeon: [name], MD;  Location: [facility];  Service: Endoscopy;  Laterality: N/A;  8:15AM   Family History  Problem Relation Age of Onset  . Breast cancer Mother     deceased from MI  . Colon cancer Maternal Aunt   . Stroke Father     MI too  . Rheumatic fever Sister 47    open heart surgery age 79, deceased age 99  . Lung cancer Sister   . Heart attack Sister   . Kidney disease Sister     born with one kidney, required transplant   History  Substance Use Topics  . Smoking status: Never Smoker   . Smokeless tobacco: Not on file  . Alcohol Use: No   OB History   Grav Para Term Preterm Abortions TAB SAB Ect Mult Living                 Review of Systems  Gastrointestinal: Positive for  diarrhea, blood in stool and anal bleeding. Negative for abdominal pain.  Neurological: Positive for weakness (generalized).    Allergies  Hydrocodone  Home Medications   Current Outpatient Rx  Name  Route  Sig  Dispense  Refill  . acetaminophen (TYLENOL) 325 MG tablet   Oral   Take 650 mg by mouth every 6 (six) hours as needed.           Marland Kitchen aspirin 81 MG tablet   Oral   Take 81 mg by mouth daily.           Marland Kitchen levothyroxine (SYNTHROID, LEVOTHROID) 50 MCG tablet   Oral   Take 50 mcg by mouth daily.           Marland Kitchen losartan (COZAAR) 100 MG tablet   Oral   Take 100 mg by mouth daily.           . Multiple Vitamin (MULTIVITAMIN) capsule   Oral   Take 1 capsule by mouth daily.           Marland Kitchen olmesartan (BENICAR) 40 MG tablet   Oral   Take 40 mg by mouth daily.           Marland Kitchen  omeprazole (PRILOSEC) 20 MG capsule   Oral   Take 20 mg by mouth 2 (two) times daily.          . polyethylene glycol (MIRALAX / GLYCOLAX) packet   Oral   Take 17 g by mouth daily.           . rosuvastatin (CRESTOR) 10 MG tablet   Oral   Take 10 mg by mouth daily.           . sertraline (ZOLOFT) 100 MG tablet   Oral   Take 100 mg by mouth daily.          Triage Vitals: BP 132/73  Pulse 90  Temp(Src) 97.6 F (36.4 C) (Oral)  Resp 18  Ht 5\' 2"  (1.575 m)  Wt 123 lb (55.792 kg)  BMI 22.49 kg/m2  SpO2 100% Physical Exam  Nursing note and vitals reviewed. Constitutional: She is oriented to person, place, and time. She appears well-developed and well-nourished.  HENT:  Head: Normocephalic and atraumatic.  Eyes: Conjunctivae and EOM are normal. Pupils are equal, round, and reactive to light.  Neck: Normal range of motion. Neck supple.  Cardiovascular: Normal rate, regular rhythm and normal heart sounds.   Pulmonary/Chest: Effort normal and breath sounds normal.  Abdominal: Soft. Bowel sounds are normal.  Genitourinary:  No masses. Red/maroon stool. Heme-positive.  Musculoskeletal:  Normal range of motion.  Neurological: She is alert and oriented to person, place, and time.  Skin: Skin is warm and dry.  Psychiatric: She has a normal mood and affect.    ED Course  Procedures (including critical care time) DIAGNOSTIC STUDIES: Oxygen Saturation is 100% on RA, normal by my interpretation.   COORDINATION OF CARE: 9:53 AM- Will obtain lab work and possibly admit. Pt verbalizes understanding and agrees to plan.  Labs Review Labs Reviewed  CBC WITH DIFFERENTIAL - Abnormal; Notable for the following:    Eosinophils Relative 6 (*)    All other components within normal limits  BASIC METABOLIC PANEL - Abnormal; Notable for the following:    Potassium 3.1 (*)    Glucose, Bld 101 (*)    BUN 25 (*)    Creatinine, Ser 1.46 (*)    GFR calc non Af Amer 33 (*)    GFR calc Af Amer 38 (*)    All other components within normal limits  OCCULT BLOOD, POC DEVICE - Abnormal; Notable for the following:    Fecal Occult Bld POSITIVE (*)    All other components within normal limits  URINALYSIS, ROUTINE W REFLEX MICROSCOPIC   Imaging Review Dg Abd Acute W/chest  07/26/2013   CLINICAL DATA:  Hypertension  EXAM: ACUTE ABDOMEN SERIES (ABDOMEN 2 VIEW & CHEST 1 VIEW)  COMPARISON:  05/03/2011  FINDINGS: Normal heart size. Lungs are hyper aerated and clear.  No free intraperitoneal gas. No disproportionate dilatation of bowel. Prominent stool throughout the colon. Round peripherally calcified density over the pelvis is consistent with patient's known calcified lipoma.  IMPRESSION: No active cardiopulmonary disease. Nonobstructive bowel gas pattern.   Electronically Signed   By: Maryclare Bean M.D.   On: 07/26/2013 11:13    EKG Interpretation     Ventricular Rate:  72 PR Interval:  140 QRS Duration: 76 QT Interval:  406 QTC Calculation: 444 R Axis:   -61 Text Interpretation:  Normal sinus rhythm Left axis deviation Anterior infarct (cited on or before 04-Dec-2006) Abnormal ECG When compared  with ECG of 04-Dec-2006 10:58, No significant change was found  MDM  No diagnosis found. Elderly patient with rectal bleeding and heme-positive stool.  Vital signs stable. Admit to general medicine.  I personally performed the services described in this documentation, which was scribed in my presence. The recorded information has been reviewed and is accurate.    Donnetta Hutching, MD 07/26/13 1214

## 2013-07-26 NOTE — ED Notes (Signed)
Pt c/o sudden onset of headache, rates at 5.

## 2013-07-27 ENCOUNTER — Encounter (HOSPITAL_COMMUNITY): Payer: Self-pay | Admitting: Gastroenterology

## 2013-07-27 DIAGNOSIS — K625 Hemorrhage of anus and rectum: Secondary | ICD-10-CM

## 2013-07-27 LAB — CBC
HCT: 25.2 % — ABNORMAL LOW (ref 36.0–46.0)
HCT: 24.2 % — ABNORMAL LOW (ref 36.0–46.0)
HCT: 24.9 % — ABNORMAL LOW (ref 36.0–46.0)
Hemoglobin: 8.4 g/dL — ABNORMAL LOW (ref 12.0–15.0)
Hemoglobin: 8.6 g/dL — ABNORMAL LOW (ref 12.0–15.0)
MCH: 31.4 pg (ref 26.0–34.0)
MCH: 31.3 pg (ref 26.0–34.0)
MCHC: 34.5 g/dL (ref 30.0–36.0)
MCHC: 34.7 g/dL (ref 30.0–36.0)
MCV: 91 fL (ref 78.0–100.0)
MCV: 90.5 fL (ref 78.0–100.0)
Platelets: 198 10*3/uL (ref 150–400)
Platelets: 195 10*3/uL (ref 150–400)
Platelets: 177 10*3/uL (ref 150–400)
Platelets: 219 10*3/uL (ref 150–400)
RBC: 2.67 MIL/uL — ABNORMAL LOW (ref 3.87–5.11)
RBC: 2.75 MIL/uL — ABNORMAL LOW (ref 3.87–5.11)
RDW: 13.1 % (ref 11.5–15.5)
RDW: 13.1 % (ref 11.5–15.5)
WBC: 8.9 10*3/uL (ref 4.0–10.5)
WBC: 8.8 10*3/uL (ref 4.0–10.5)

## 2013-07-27 LAB — BASIC METABOLIC PANEL
BUN: 33 mg/dL — ABNORMAL HIGH (ref 6–23)
CO2: 23 mEq/L (ref 19–32)
Chloride: 105 mEq/L (ref 96–112)
Creatinine, Ser: 1.4 mg/dL — ABNORMAL HIGH (ref 0.50–1.10)
GFR calc Af Amer: 40 mL/min — ABNORMAL LOW (ref >90)
Potassium: 3.6 mEq/L (ref 3.5–5.1)

## 2013-07-27 MED ORDER — SODIUM CHLORIDE 0.9 % IV SOLN: 250.0000 mL | INTRAVENOUS | Status: DC | PRN

## 2013-07-27 MED ORDER — POTASSIUM CHLORIDE IN NACL 20-0.9 MEQ/L-% IV SOLN
INTRAVENOUS | Status: AC
  Administered 2013-07-27 – 2013-07-28 (×3): via INTRAVENOUS

## 2013-07-27 MED ORDER — PANTOPRAZOLE SODIUM 40 MG PO TBEC
40.0000 mg | DELAYED_RELEASE_TABLET | Freq: Two times a day (BID) | ORAL | Status: DC
  Administered 2013-07-27 – 2013-07-30 (×6): 40 mg via ORAL
  Filled 2013-07-27 (×6): qty 1

## 2013-07-27 MED ORDER — POTASSIUM CHLORIDE CRYS ER 20 MEQ PO TBCR
20.0000 meq | EXTENDED_RELEASE_TABLET | Freq: Three times a day (TID) | ORAL | Status: AC
  Administered 2013-07-27 – 2013-07-28 (×5): 20 meq via ORAL
  Filled 2013-07-27 (×6): qty 1

## 2013-07-27 MED ORDER — PEG 3350-KCL-NA BICARB-NACL 420 G PO SOLR
4000.0000 mL | Freq: Once | ORAL | Status: AC
  Administered 2013-07-27: 4000 mL via ORAL
  Filled 2013-07-27: qty 4000

## 2013-07-27 MED ORDER — BISACODYL 5 MG PO TBEC
10.0000 mg | DELAYED_RELEASE_TABLET | ORAL | Status: AC
  Administered 2013-07-27 (×2): 10 mg via ORAL
  Filled 2013-07-27 (×2): qty 2

## 2013-07-27 MED ORDER — POLYETHYLENE GLYCOL 3350 17 G PO PACK
17.0000 g | PACK | ORAL | Status: AC
  Administered 2013-07-27 (×5): 17 g via ORAL
  Filled 2013-07-27 (×7): qty 1

## 2013-07-27 MED ORDER — PROMETHAZINE HCL 25 MG/ML IJ SOLN
12.5000 mg | Freq: Four times a day (QID) | INTRAMUSCULAR | Status: DC | PRN
  Administered 2013-07-27: 12.5 mg via INTRAVENOUS
  Filled 2013-07-27: qty 1

## 2013-07-27 MED ORDER — BISACODYL 5 MG PO TBEC
10.0000 mg | DELAYED_RELEASE_TABLET | Freq: Once | ORAL | Status: AC
  Administered 2013-07-27: 5 mg via ORAL
  Filled 2013-07-27: qty 2

## 2013-07-27 MED ORDER — SODIUM CHLORIDE 0.9 % IV SOLN
INTRAVENOUS | Status: DC
  Administered 2013-07-28: 17:00:00 via INTRAVENOUS

## 2013-07-27 NOTE — Consult Note (Addendum)
REVIEWED. PT UNABLE TO TOLERATE PREP. CLEAR LIQUIDS. NPO AFTER MN. TCS OCT 21.  PT STATED HER SWALLOWING NOT REALLY AN ISSUE AT THIS TIME. CONTINUE TO MONITOR SYMPTOMS.

## 2013-07-27 NOTE — Progress Notes (Signed)
Robin Grant, MEENAN NO.:  [id number]  MEDICAL RECORD NO.:  [id number]  LOCATION:  IC06                          FACILITY:  [facility]  PHYSICIAN:  Kingsley Callander. Ouida Sills, MD       DATE OF BIRTH:  11-11-1930  DATE OF PROCEDURE: DATE OF DISCHARGE:                                PROGRESS NOTE   Mrs. Kley was admitted yesterday with a lower GI bleed.  She had passed some dark and some bright red blood.  Her hemoglobin has dropped from 13.2 to 8.7.  She has not been hypotensive.  She has not had abdominal pain.  She is not taking any aspirin at home.  She states she has a past history of diverticulosis.  PHYSICAL EXAMINATION:  VITAL SIGNS:  This morning, her blood pressure is 105/57 with a pulse of 75, and temperature of 98.2. GENERAL:  She had experienced some vomiting last week.  She has not had hematemesis.  She does note that she has had some melena in recent weeks.  She appears comfortable. LUNGS:  Clear. HEART:  Regular with no murmurs. ABDOMEN:  Soft and nontender with no palpable organomegaly. EXTREMITIES:  Reveal no edema.  IMPRESSION AND PLAN: 1. Lower gastrointestinal bleed, possibly diverticular in origin.     Consult Gastroenterology for additional evaluation. 2. Anemia.  Hemoglobin has dropped significantly overnight.  She may     require transfusion later today.  We will continue to follow serial     hemoglobins. 3. Chronic kidney disease.  Creatinine is stable at 1.4. 4. Hypertension.  Antihypertensive therapy on hold. 5. Hypokalemia.  Serum potassium is normalized from 3.1 to 3.6. 6. Hypothyroidism.  Continue levothyroxine. 7. History of depression and anxiety.  Continue sertraline.     Kingsley Callander. Ouida Sills, MD     ROF/MEDQ  D:  07/27/2013  T:  07/27/2013  Job:  [id number]

## 2013-07-27 NOTE — Consult Note (Signed)
Referring Provider: Dr. Ouida Sills Primary Care Physician:  Dr. Ouida Sills Primary Gastroenterologist:  Dr. [name]   Date of Admission: 07/26/13 Date of Consultation: 07/27/13  Reason for Consultation:  Rectal bleeding, acute blood loss anemia  HPI:  Robin Grant is an 79 year old female well-known to our practice, last seen as an outpatient in July 2012. Last EGD in Aug 2012 by Dr. [name] with empiric dilation, last colonoscopy in 2007 with pancolonic diverticula. Admitted Oct 19th with sudden onset of painless rectal bleeding. Admitting Hgb 13.2, with drift down to 8.7 as of this morning.  Thought she had diarrhea yesterday morning, but she then realized she had bright red blood/dark red blood per rectum and "the biggest mess you've ever seen". Denied any abdominal pain. Presented to the ED. This morning around 6am had another large episode of rectal bleeding. Notes episodes of N/V when standing up X 2 weeks, intermittent. No associated with food intake. Feels weak and nauseated with standing, no hematemesis. Has chronic history of constipation. Miralax prn. Rare indigestion. Feels like sometimes after eating, "it sticks in here", pointing to cervical esophagus area. Present for about a week that she has noticed. Thinks she saw black,tarry stool last week. Takes Aleve "once in awhile". No aspirin powders. Denies taking aspirin 81 mg daily, Prilosec 20 mg BID.   Past Medical History  Diagnosis Date  . Hyperlipidemia   . HTN (hypertension)   . Chronic constipation   . Anxiety   . Hypothyroidism   . Insomnia     Past Surgical History  Procedure Laterality Date  . Tubal ligation    . Appendectomy    . Colonoscopy  2007    Dr. [name], pancolonic diverticula.  . Right ankle surgery  2007    osteomyelitis  . Port-a-cath removal  2008  . Esophagogastroduodenoscopy  05/21/2011    Dr. [name]: normal tubular esophagus, s/p empiric dilation with 29 F, innocent appearing AVM, small hiatal hernia    Prior to  Admission medications   Medication Sig Start Date End Date Taking? Authorizing Provider  acetaminophen (TYLENOL) 325 MG tablet Take 650 mg by mouth every 6 (six) hours as needed.      Historical Provider, MD  aspirin 81 MG tablet Take 81 mg by mouth daily.      Historical Provider, MD  levothyroxine (SYNTHROID, LEVOTHROID) 50 MCG tablet Take 50 mcg by mouth daily.      Historical Provider, MD  losartan (COZAAR) 100 MG tablet Take 100 mg by mouth daily.      Historical Provider, MD  Multiple Vitamin (MULTIVITAMIN) capsule Take 1 capsule by mouth daily.      Historical Provider, MD  olmesartan (BENICAR) 40 MG tablet Take 40 mg by mouth daily.      Historical Provider, MD  omeprazole (PRILOSEC) 20 MG capsule Take 20 mg by mouth 2 (two) times daily.     Historical Provider, MD  polyethylene glycol (MIRALAX / GLYCOLAX) packet Take 17 g by mouth daily.      Historical Provider, MD  rosuvastatin (CRESTOR) 10 MG tablet Take 10 mg by mouth daily.      Historical Provider, MD  sertraline (ZOLOFT) 100 MG tablet Take 100 mg by mouth daily.    Historical Provider, MD    Current Facility-Administered Medications  Medication Dose Route Frequency Provider Last Rate Last Dose  . 0.9 %  sodium chloride infusion  250 mL Intravenous PRN [name], MD      . acetaminophen (TYLENOL) tablet 650 mg  650 mg Oral Q6H PRN Standley Brooking, MD       Or  . acetaminophen (TYLENOL) suppository 650 mg  650 mg Rectal Q6H PRN Standley Brooking, MD      . influenza vac split quadrivalent PF (FLUARIX) injection 0.5 mL  0.5 mL Intramuscular Tomorrow-1000 [name], MD      . levothyroxine (SYNTHROID, LEVOTHROID) tablet 50 mcg  50 mcg Oral Daily Standley Brooking, MD   50 mcg at 07/26/13 1423  . ondansetron (ZOFRAN) tablet 4 mg  4 mg Oral Q6H PRN Standley Brooking, MD       Or  . ondansetron [facility]) injection 4 mg  4 mg Intravenous Q6H PRN Standley Brooking, MD   4 mg at 07/27/13 0754  . pantoprazole (PROTONIX) EC tablet 40 mg   40 mg Oral BID Standley Brooking, MD   40 mg at 07/26/13 2106  . potassium chloride SA (K-DUR,KLOR-CON) CR tablet 20 mEq  20 mEq Oral TID [name], MD      . sertraline (ZOLOFT) tablet 100 mg  100 mg Oral Daily Standley Brooking, MD   100 mg at 07/26/13 1423  . sodium chloride 0.9 % injection 3 mL  3 mL Intravenous Q12H Standley Brooking, MD      . sodium chloride 0.9 % injection 3 mL  3 mL Intravenous Q12H Standley Brooking, MD   3 mL at 07/26/13 2107  . sodium chloride 0.9 % injection 3 mL  3 mL Intravenous PRN Standley Brooking, MD        Allergies as of 07/26/2013 - Review Complete 07/26/2013  Allergen Reaction Noted  . Hydrocodone Nausea And Vomiting 05/03/2011    Family History  Problem Relation Age of Onset  . Breast cancer Mother     deceased from MI  . Colon cancer Maternal Aunt   . Stroke Father     MI too  . Rheumatic fever Sister 86    open heart surgery age 27, deceased age 66  . Lung cancer Sister   . Heart attack Sister   . Kidney disease Sister     born with one kidney, required transplant    History   Social History  . Marital Status: Married    Spouse Name: N/A    Number of Children: 2  . Years of Education: N/A   Occupational History  .  retired     [employer]   Social History Main Topics  . Smoking status: Never Smoker   . Smokeless tobacco: Not on file  . Alcohol Use: No  . Drug Use: No  . Sexual Activity: Not on file   Other Topics Concern  . Not on file   Social History Narrative  . No narrative on file    Review of Systems: Gen: feels tired, weak CV: Denies chest pain, heart palpitations, syncope, edema  Resp: Denies shortness of breath with rest, cough, wheezing GI: see HPI GU : Denies urinary burning, urinary frequency, urinary incontinence.  MS: intermittent joint pain  Derm: Denies rash, itching, dry skin Psych: Denies depression, anxiety,confusion, or memory loss Heme: Denies bruising, bleeding, and enlarged  lymph nodes.  Physical Exam: Vital signs in last 24 hours: Temp:  [97.6 F (36.4 C)-98.5 F (36.9 C)] 98.2 F (36.8 C) (10/20 0733) Pulse Rate:  [68-90] 68 (10/19 1136) Resp:  [15-22] 16 (10/19 2000) BP: (90-132)/(49-73) 105/57 mmHg (10/20 0630) SpO2:  [96 %-100 %] 96 % (10/20  0030) Weight:  [123 lb (55.792 kg)-123 lb 0.3 oz (55.8 kg)] 123 lb 0.3 oz (55.8 kg) (10/20 0500) Last BM Date: 07/26/13 General:   Alert,  Pleasant and cooperative Head:  Normocephalic and atraumatic. Eyes:  Sclera clear, no icterus.   Conjunctiva pink. Ears:  Mild HOH Nose:  No deformity, discharge,  or lesions. Mouth:  No deformity or lesions, dentition normal. Lungs:  Clear throughout to auscultation.   No wheezes, crackles, or rhonchi. No acute distress. Heart:  S1 S2 present; no murmurs, clicks, rubs,  or gallops. Abdomen:  Soft, nontender and nondistended. No masses, hepatosplenomegaly or hernias noted. Normal bowel sounds, without guarding, and without rebound.   Rectal:  Deferred until time of colonoscopy.   Msk:  Symmetrical without gross deformities. Normal posture. Pulses:  Normal pulses noted. Extremities:  Without clubbing or edema. Neurologic:  Alert and  oriented x4;  grossly normal neurologically. Skin:  Intact without significant lesions or rashes. Psych:  Alert and cooperative. Normal mood and affect.  Intake/Output from previous day: 10/19 0701 - 10/20 0700 In: 120 [P.O.:120] Out: -  Intake/Output this shift:    Lab Results:  Recent Labs  07/26/13 2121 07/27/13 0158 07/27/13 0540  WBC 7.4 8.9 8.8  HGB 9.2* 9.0* 8.7*  HCT 26.7* 25.7* 25.2*  PLT 198 198 195   BMET  Recent Labs  07/26/13 0937 07/27/13 0540  NA 135 136  K 3.1* 3.6  CL 100 105  CO2 26 23  GLUCOSE 101* 125*  BUN 25* 33*  CREATININE 1.46* 1.40*  CALCIUM 9.9 8.7    Studies/Results: Dg Abd Acute W/chest  07/26/2013   CLINICAL DATA:  Hypertension  EXAM: ACUTE ABDOMEN SERIES (ABDOMEN 2 VIEW & CHEST 1  VIEW)  COMPARISON:  05/03/2011  FINDINGS: Normal heart size. Lungs are hyper aerated and clear.  No free intraperitoneal gas. No disproportionate dilatation of bowel. Prominent stool throughout the colon. Round peripherally calcified density over the pelvis is consistent with patient's known calcified lipoma.  IMPRESSION: No active cardiopulmonary disease. Nonobstructive bowel gas pattern.   Electronically Signed   By: Maryclare Bean M.D.   On: 07/26/2013 11:13    Impression: 79 year old female admitted with painless rectal bleeding in the setting of known diverticular disease and associated acute blood loss anemia with Hgb now 8.7, which is down from admitting Hgb 13.2. She has had 2 large episodes of rectal bleeding since admission, last this morning around 6am. Denies taking a baby aspirin daily as an outpatient, although this is listed on her outpatient medication list. She also denies any NSAIDs routinely or aspirin powders.   As of note, she has a history of dysphagia, with last EGD in 2012 with empiric dilation. She notes vague cervical dysphagia for the past week and intermittent nausea, occasional emesis with standing up. Would recommend pursuing upper GI symptoms on an elective outpatient basis and proceed with a colonoscopy today with Dr. Darrick Penna. Will start prep now, with hopes to have this completed by noon today. Will plan for colonoscopy with Dr. Darrick Penna at 3pm. If patient is unable to complete or tolerate her prep, she will need to have this performed tomorrow. Discussed with patient, who states understanding of the risks and benefits of the procedure.   Plan: Colonoscopy with Dr. Darrick Penna today.  Golytely 4 liters now Tap water enema X 2 at 12pm today Serial H/H Transfuse if Hgb falls less than 8 Outpatient elective evaluation of upper GI symptoms  Robin Grant, ANP-BC [facility]     LOS: 1 day    07/27/2013, 8:24 AM

## 2013-07-27 NOTE — Care Management Note (Signed)
    Page 1 of 1   07/27/2013     1:51:04 PM   CARE MANAGEMENT NOTE 07/27/2013  Patient:  Robin Grant, Robin Grant   Account Number:  [id number]  Date Initiated:  07/27/2013  Documentation initiated by:  Sharrie Rothman  Subjective/Objective Assessment:   Pt admitted from home with gi bleeding. Pt lives with her grandson and will return home at discharge. Pt is independent with ADL's.     Action/Plan:   No CM needs noted.   Anticipated DC Date:  07/30/2013   Anticipated DC Plan:  HOME/SELF CARE      DC Planning Services  CM consult      Choice offered to / List presented to:             Status of service:  Completed, signed off Medicare Important Message given?   (If response is "NO", the following Medicare IM given date fields will be blank) Date Medicare IM given:   Date Additional Medicare IM given:    Discharge Disposition:  HOME/SELF CARE  Per UR Regulation:    If discussed at Long Length of Stay Meetings, dates discussed:    Comments:  07/27/13 1350 Arlyss Queen, RN BSN CM

## 2013-07-27 NOTE — Progress Notes (Signed)
Patient had a large bowel movement around 0430 that contained a copious amount of red blood. Pt has no c/o pain, SOB, or fatigue. Heart rate is 80 and BP 105//63 @ 0530. AM labs show HgB of 8.7 @ 0540 down slightly from the 9.1 drawn at 0158.

## 2013-07-27 NOTE — Progress Notes (Signed)
UR chart review completed.  

## 2013-07-28 ENCOUNTER — Encounter (HOSPITAL_COMMUNITY): Payer: Self-pay | Admitting: *Deleted

## 2013-07-28 ENCOUNTER — Encounter (HOSPITAL_COMMUNITY)
Admission: EM | Disposition: A | Discharge status: Home / Self Care | Payer: Self-pay | Source: Home / Self Care | Attending: Internal Medicine

## 2013-07-28 DIAGNOSIS — K621 Rectal polyp: Secondary | ICD-10-CM

## 2013-07-28 DIAGNOSIS — K2960 Other gastritis without bleeding: Secondary | ICD-10-CM

## 2013-07-28 DIAGNOSIS — K625 Hemorrhage of anus and rectum: Secondary | ICD-10-CM

## 2013-07-28 DIAGNOSIS — K620 Anal polyp: Secondary | ICD-10-CM

## 2013-07-28 DIAGNOSIS — K5730 Diverticulosis of large intestine without perforation or abscess without bleeding: Secondary | ICD-10-CM

## 2013-07-28 DIAGNOSIS — K921 Melena: Secondary | ICD-10-CM

## 2013-07-28 HISTORY — PX: COLONOSCOPY: SHX5424

## 2013-07-28 LAB — CBC
Platelets: 160 10*3/uL (ref 150–400)
RDW: 13.5 % (ref 11.5–15.5)
WBC: 6.4 10*3/uL (ref 4.0–10.5)

## 2013-07-28 LAB — BASIC METABOLIC PANEL
Calcium: 8.3 mg/dL — ABNORMAL LOW (ref 8.4–10.5)
Creatinine, Ser: 1.42 mg/dL — ABNORMAL HIGH (ref 0.50–1.10)
GFR calc Af Amer: 39 mL/min — ABNORMAL LOW (ref >90)
GFR calc non Af Amer: 34 mL/min — ABNORMAL LOW (ref >90)

## 2013-07-28 SURGERY — COLONOSCOPY
Anesthesia: Moderate Sedation

## 2013-07-28 MED ORDER — MIDAZOLAM HCL 5 MG/5ML IJ SOLN
INTRAMUSCULAR | Status: AC
  Filled 2013-07-28: qty 10

## 2013-07-28 MED ORDER — STERILE WATER FOR IRRIGATION IR SOLN
Status: DC | PRN
  Administered 2013-07-28: 14:00:00

## 2013-07-28 MED ORDER — MEPERIDINE HCL 100 MG/ML IJ SOLN
INTRAMUSCULAR | Status: DC | PRN
  Administered 2013-07-28 (×2): 25 mg via INTRAVENOUS

## 2013-07-28 MED ORDER — MIDAZOLAM HCL 5 MG/5ML IJ SOLN
INTRAMUSCULAR | Status: DC | PRN
  Administered 2013-07-28: 1 mg via INTRAVENOUS
  Administered 2013-07-28: 2 mg via INTRAVENOUS
  Administered 2013-07-28 (×2): 1 mg via INTRAVENOUS

## 2013-07-28 MED ORDER — MEPERIDINE HCL 100 MG/ML IJ SOLN
INTRAMUSCULAR | Status: AC
  Filled 2013-07-28: qty 2

## 2013-07-28 MED ORDER — SODIUM CHLORIDE 0.9 % IV SOLN
INTRAVENOUS | Status: DC
  Administered 2013-07-28: 13:00:00 via INTRAVENOUS

## 2013-07-28 MED ORDER — BUTAMBEN-TETRACAINE-BENZOCAINE 2-2-14 % EX AERO
INHALATION_SPRAY | CUTANEOUS | Status: DC | PRN
  Administered 2013-07-28: 2 via TOPICAL

## 2013-07-28 NOTE — Progress Notes (Signed)
NAMERAMANDEEP, ARINGTON NO.:  [id number]  MEDICAL RECORD NO.:  [id number]  LOCATION:  IC06                          FACILITY:  [facility]  PHYSICIAN:  Kingsley Callander. Ouida Sills, MD       DATE OF BIRTH:  06-Feb-1931  DATE OF PROCEDURE:  07/28/2013 DATE OF DISCHARGE:                                PROGRESS NOTE   SUBJECTIVE:  Ms. Mandarino has had no bleeding overnight.  Her hemoglobin has trended down to 7.0.  She denies any abdominal pain.  She has taken her preparation for colonoscopy scheduled for 1:30 today.  OBJECTIVE:  VITAL SIGNS:  Blood pressure 93/53 with a pulse of 85. LUNGS:  Clear. HEART:  Regular with no murmurs. ABDOMEN:  Soft and nontender. EXTREMITIES:  No edema. GENERAL APPEARANCE:  Alert and comfortable.  ASSESSMENT/PLAN: 1. Lower GI bleed.  Colonoscopy scheduled for today, transfused with 2     units of packed red cells. 2. Chronic kidney disease, stable with a BUN and creatinine of 25 and     1.42.     Kingsley Callander. Ouida Sills, MD     ROF/MEDQ  D:  07/28/2013  T:  07/28/2013  Job:  [id number]

## 2013-07-28 NOTE — Progress Notes (Signed)
FIRST UNIT PRBC HAS COMPLETED. SECOND UNIT HAS BEEN  STARTED. PT HAD RECTAL BLOOD CLOT THE SIZE OF A SILVER HALF DOLLAR.

## 2013-07-28 NOTE — Op Note (Signed)
[facility] [address] [place], [address]   ENDOSCOPY PROCEDURE REPORT  PATIENT: Robin Grant, Robin Grant  MR#: [id number] BIRTHDATE: April 06, 1931 , 82  yrs. old GENDER: Female  ENDOSCOPIST: Jonette Eva, MD REFERRED Raphael Gibney, M.D.  PROCEDURE DATE: 07/28/2013 PROCEDURE:   EGD, diagnostic  INDICATIONS:Hematochezia.   Melena. MEDICATIONS:TCS +  Demerol 25 mg IV and Versed 1mg  IV TOPICAL ANESTHETIC:   Cetacaine Spray  DESCRIPTION OF PROCEDURE:     Physical exam was performed.  Informed consent was obtained from the patient after explaining the benefits, risks, and alternatives to the procedure.  The patient was connected to the monitor and placed in the left lateral position.  Continuous oxygen was provided by nasal cannula and IV medicine administered through an indwelling cannula.  After administration of sedation, the patients esophagus was intubated and the EG-[id number] ([id number])  endoscope was advanced under direct visualization to the second portion of the duodenum.  The scope was removed slowly by carefully examining the color, texture, anatomy, and integrity of the mucosa on the way out.  The patient was recovered in endoscopy and discharged home in satisfactory condition.   ESOPHAGUS: The mucosa of the esophagus appeared normal.   STOMACH: Moderate erosive gastritis (inflammation) was found in the gastric antrum and gastric body.   DUODENUM: The duodenal mucosa showed no abnormalities in the bulb and second portion of the duodenum.   NO OLD OR FRESH BLOOD SEEN IN STOMACH OR DUODENUM.  NO BIOPSIES OBTAINED.  COMPLICATIONS:   None  ENDOSCOPIC IMPRESSION: 1.   MODERATE Erosive gastritis 2.   NO OLD OR FRESH BLOOD SEEN IN STOMACH OR DUODENUM.  RECOMMENDATIONS: CONSIDER tRBC SCAN IF CONTINUES BLEEDING/TRANSFUSION DEPENDENT ANEMIA. CHECK H PYLORI SEROLOGY. NO BIOPSIES OBTAINED BECAUSE PT MAY BE CONSIDERED FOR CAPSULE ENDOSCOPY IF tRBC SCAN  NEGATIVE. ADVANCE DIET BID PPI   REPEAT EXAM:   _______________________________ Rosalie DoctorJonette Eva, MD 07/28/2013 5:05 PM

## 2013-07-28 NOTE — H&P (Signed)
Primary Care Physician:  [name], MD Primary Gastroenterologist:  Dr. Darrick Penna  Pre-Procedure History & Physical: HPI:  Robin Grant is a 79 y.o. female here for BRBPR.  Past Medical History  Diagnosis Date  . Hyperlipidemia   . HTN (hypertension)   . Chronic constipation   . Anxiety   . Hypothyroidism   . Insomnia     Past Surgical History  Procedure Laterality Date  . Tubal ligation    . Appendectomy    . Colonoscopy  2007    Dr. [name], pancolonic diverticula.  . Right ankle surgery  2007    osteomyelitis  . Port-a-cath removal  2008  . Esophagogastroduodenoscopy  05/21/2011    Dr. [name]: normal tubular esophagus, s/p empiric dilation with 40 F, innocent appearing AVM, small hiatal hernia    Prior to Admission medications   Medication Sig Start Date End Date Taking? Authorizing Provider  acetaminophen (TYLENOL) 325 MG tablet Take 650 mg by mouth daily as needed for pain.   Yes Historical Provider, MD  levothyroxine (SYNTHROID, LEVOTHROID) 50 MCG tablet Take 50 mcg by mouth daily.     Yes Historical Provider, MD  losartan (COZAAR) 100 MG tablet Take 100 mg by mouth daily.     Yes Historical Provider, MD  olmesartan (BENICAR) 40 MG tablet Take 40 mg by mouth daily.     Yes Historical Provider, MD  omeprazole (PRILOSEC) 20 MG capsule Take 20 mg by mouth 2 (two) times daily.    Yes Historical Provider, MD  polyethylene glycol (MIRALAX / GLYCOLAX) packet Take 17 g by mouth 2 (two) times a week.   Yes Historical Provider, MD  rosuvastatin (CRESTOR) 10 MG tablet Take 10 mg by mouth daily.     Yes Historical Provider, MD  sertraline (ZOLOFT) 100 MG tablet Take 100 mg by mouth daily.   Yes Historical Provider, MD  Multiple Vitamin (MULTIVITAMIN) capsule Take 1 capsule by mouth daily.      Historical Provider, MD    Allergies as of 07/26/2013 - Review Complete 07/26/2013  Allergen Reaction Noted  . Hydrocodone Nausea And Vomiting 05/03/2011    Family History  Problem  Relation Age of Onset  . Breast cancer Mother     deceased from MI  . Colon cancer Maternal Aunt   . Stroke Father     MI too  . Rheumatic fever Sister 51    open heart surgery age 48, deceased age 81  . Lung cancer Sister   . Heart attack Sister   . Kidney disease Sister     born with one kidney, required transplant    History   Social History  . Marital Status: Married    Spouse Name: N/A    Number of Children: 2  . Years of Education: N/A   Occupational History  .  retired     [employer]   Social History Main Topics  . Smoking status: Never Smoker   . Smokeless tobacco: Not on file  . Alcohol Use: No  . Drug Use: No  . Sexual Activity: Not on file   Other Topics Concern  . Not on file   Social History Narrative  . No narrative on file    Review of Systems: See HPI, otherwise negative ROS   Physical Exam: BP 104/62  Pulse 77  Temp(Src) 98.4 F (36.9 C) (Oral)  Resp 19  Ht 5\' 2"  (1.575 m)  Wt 120 lb 13 oz (54.8 kg)  BMI 22.09 kg/m2  SpO2 100% General:   Alert,  pleasant and cooperative in NAD Head:  Normocephalic and atraumatic. Neck:  Supple; Lungs:  Clear throughout to auscultation.    Heart:  Regular rate and rhythm. Abdomen:  Soft, nontender and nondistended. Normal bowel sounds, without guarding, and without rebound.   Neurologic:  Alert and  oriented x4;  grossly normal neurologically.  Impression/Plan:    BRBPR  PLAN: TCS TODAY

## 2013-07-28 NOTE — Progress Notes (Signed)
PT IS BACK FROM COLONOSCOPY AND EGD. DROWSY BUT AROUSEABLE  VSS. FAMILY IS AT BEDSIDE.

## 2013-07-28 NOTE — Op Note (Signed)
[facility] [address] [place], [address]   COLONOSCOPY PROCEDURE REPORT  PATIENT: Robin Grant, Robin Grant  MR#: [id number] BIRTHDATE: 1931/05/04 , 82  yrs. old GENDER: Female ENDOSCOPIST: Jonette Eva, MD REFERRED Raphael Gibney, M.D. PROCEDURE DATE:  07/28/2013 PROCEDURE:   ILEOColonoscopy with biopsy INDICATIONS:Rectal Bleeding.  S/P 2u pRBCs HB DROPPED FROM 13.2 TO 7.0 MEDICATIONS: Demerol 25 mg IV and Versed 4 mg IV  DESCRIPTION OF PROCEDURE:    Physical exam was performed.  Informed consent was obtained from the patient after explaining the benefits, risks, and alternatives to procedure.  The patient was connected to monitor and placed in left lateral position. Continuous oxygen was provided by nasal cannula and IV medicine administered through an indwelling cannula.  After administration of sedation and rectal exam, the patients rectum was intubated and the [id number] ([id number])  colonoscope was advanced under direct visualization to the ileum.  The scope was removed slowly by carefully examining the color, texture, anatomy, and integrity mucosa on the way out.  The patient was recovered in endoscopy and discharged home in satisfactory condition.    COLON FINDINGS: LARGE AMOUNT OF OLD BLOOD IN COLON FROM RECTUM TO CECUM.  UNABLE TO VISUALIZE MUCOSA COMPLETE DUE T LARGE AMOUNT OF OLD BLOOD AND CLOTS.  NO ACTIVE BLEEDING APPRECIATED.  MULTIPLE DIVERTICULA THROUGHOUT THE COLON.  RED BLOOD SEEN IN THE ILEUM. UNABLE TO ADVANCE MORE THAN 5-10 CM BECAUSE PT HAS A REDUNDANT COLON.  POLYPOID LESION IN RECTUM BIOPSIED VIA COLD FORCEPS.    THE COLON IS REDUNDANT.  PREP QUALITY: poor.  CECAL W/D TIME: 16 minutes  COMPLICATIONS: None  ENDOSCOPIC IMPRESSION: LARGE AMOUNT OF OLD BLOOD IN COLON FROM RECTUM TO CECUM.  UNABLE TO VISUALIZE MUCOSA COMPLETE DUE T LARGE AMOUNT OF OLD BLOOD AND CLOTS.  NO ACTIVE BLEEDING APPRECIATED.  MULTIPLE DIVERTICULA THROUGHOUT THE  COLON.  RED BLOOD SEEN IN THE ILEUM.  UNABLE TO ADVNACE MORETHAN 5-10 CM BECAUSE PT HAS A REDUNDANT COLON.  POLYPOID LESION IN RECTUM BIOPSIED VIA COLD FORCEPS.   RECOMMENDATIONS: RED BLOOD IN ILEUM, PROCEED TO EGD. AWAIT BIOPSY PT MAY NEED REPEAT TCS TO CLEAR COLON IF SIMPLE ADENOMA IN RECTUM.       _______________________________ Rosalie DoctorJonette Eva, MD 07/28/2013 4:57 PM     PATIENT NAME:  Robin Grant, Robin Grant MR#: [id number]

## 2013-07-29 ENCOUNTER — Inpatient Hospital Stay (HOSPITAL_COMMUNITY): Payer: Medicare Other

## 2013-07-29 ENCOUNTER — Encounter (HOSPITAL_COMMUNITY): Payer: Self-pay

## 2013-07-29 LAB — CBC
MCH: 31.1 pg (ref 26.0–34.0)
MCV: 90 fL (ref 78.0–100.0)
Platelets: 123 10*3/uL — ABNORMAL LOW (ref 150–400)
RBC: 2.41 MIL/uL — ABNORMAL LOW (ref 3.87–5.11)
RDW: 14.8 % (ref 11.5–15.5)
WBC: 5 10*3/uL (ref 4.0–10.5)

## 2013-07-29 LAB — GLUCOSE, CAPILLARY: Glucose-Capillary: 106 mg/dL — ABNORMAL HIGH (ref 70–99)

## 2013-07-29 LAB — H. PYLORI ANTIBODY, IGG: H Pylori IgG: 0.81 {ISR}

## 2013-07-29 LAB — PREPARE RBC (CROSSMATCH)

## 2013-07-29 MED ORDER — HEPARIN SOD (PORK) LOCK FLUSH 100 UNIT/ML IV SOLN
INTRAVENOUS | Status: AC
  Filled 2013-07-29: qty 5

## 2013-07-29 MED ORDER — LORAZEPAM 0.5 MG PO TABS
0.5000 mg | ORAL_TABLET | ORAL | Status: DC | PRN
  Administered 2013-07-29: 0.5 mg via ORAL
  Filled 2013-07-29: qty 1

## 2013-07-29 MED ORDER — TECHNETIUM TC 99M-LABELED RED BLOOD CELLS IV KIT
25.0000 | PACK | Freq: Once | INTRAVENOUS | Status: AC | PRN
  Administered 2013-07-29: 25 via INTRAVENOUS

## 2013-07-29 NOTE — Progress Notes (Signed)
Received call from Thurnell Lose, RN, who informed me that patient has had another episode of rectal bleeding with clots. Per algorithm already outlined by Dr. Darrick Penna, will proceed with stat tagged RBC scan. I spoke with nuclear medicine, who stated they should be able to get the patient in quickly today.

## 2013-07-29 NOTE — Progress Notes (Signed)
NAMEXELA, OREGEL NO.:  [id number]  MEDICAL RECORD NO.:  [id number]  LOCATION:  IC06                          FACILITY:  [facility]  PHYSICIAN:  Kingsley Callander. Ouida Sills, MD       DATE OF BIRTH:  02/18/1931  DATE OF PROCEDURE:  07/29/2013 DATE OF DISCHARGE:                                PROGRESS NOTE   Ms. Quackenbush had no bleeding overnight.  She feels comfortable.  She was found to have blood throughout her colon and in the terminal ileum on her colonoscopy yesterday.  A small polyp was also noted in the rectum which was biopsied.  She underwent an upper endoscopy also which revealed moderate erosive gastritis, but no old or fresh blood.  The recommendation was to consider a tagged red cell study.  Her hemoglobin this morning is 7.5.  Systolic blood pressure remained in the 90s, pulse 80.  PHYSICAL EXAMINATION:  LUNGS:  Clear. HEART:  Regular with no murmurs. ABDOMEN:  Soft and nontender with no palpable organomegaly.  IMPRESSION/PLAN: 1. GI bleed, possibly small intestinal in origin.  Consider capsule     study.  We will discuss with gastroenterology. 2. Anemia.  Transfuse with 2 additional units of packed red cells.     She received 2 units yesterday without complication. 3. Thrombocytopenia.  Her platelet count has dropped from 219 to 160     to 123. 4. Anxiety, well controlled.     Kingsley Callander. Ouida Sills, MD     ROF/MEDQ  D:  07/29/2013  T:  07/29/2013  Job:  [id number]

## 2013-07-29 NOTE — Progress Notes (Signed)
Subjective: No further evidence of overt GI bleeding. No abdominal pain, N/V. Sitting up eating breakfast. Colonoscopy/EGD yesterday by Dr. Darrick Penna.   Objective: Vital signs in last 24 hours: Temp:  [98.1 F (36.7 C)-98.8 F (37.1 C)] 98.1 F (36.7 C) (10/22 0400) Pulse Rate:  [75-95] 81 (10/21 1800) Resp:  [15-26] 25 (10/21 1540) BP: (73-130)/(27-102) 73/27 mmHg (10/21 1800) SpO2:  [94 %-100 %] 100 % (10/21 1800) Weight:  [122 lb 9.2 oz (55.6 kg)] 122 lb 9.2 oz (55.6 kg) (10/22 0500) Last BM Date: 07/28/13 General:   Alert and oriented, pleasant, appears in good spirits Head:  Normocephalic and atraumatic. Eyes:  No icterus, sclera clear. Conjuctiva pink.  Heart:  S1, S2 present, no murmurs noted.  Lungs: Clear to auscultation bilaterally, without wheezing, rales, or rhonchi.  Abdomen:  Bowel sounds present, soft, non-tender, non-distended. No HSM or hernias noted. No rebound or guarding. No masses appreciated Extremities:  Without clubbing or edema. Neurologic:  Alert and  oriented x4;  grossly normal neurologically.   Intake/Output from previous day: 10/21 0701 - 10/22 0700 In: 2652.5 [P.O.:720; I.V.:1200; Blood:732.5] Out: -  Intake/Output this shift:    Lab Results:  Recent Labs  07/27/13 1404 07/28/13 0455 07/29/13 0438  WBC 11.8* 6.4 5.0  HGB 8.6* 7.0* 7.5*  HCT 24.9* 20.4* 21.7*  PLT 219 160 123*   BMET  Recent Labs  07/26/13 0937 07/27/13 0540 07/28/13 0455  NA 135 136 136  K 3.1* 3.6 3.7  CL 100 105 105  CO2 26 23 22   GLUCOSE 101* 125* 111*  BUN 25* 33* 25*  CREATININE 1.46* 1.40* 1.42*  CALCIUM 9.9 8.7 8.3*     Assessment: 79 year old female admitted with large-volume hematochezia and requiring 2 units PRBCs thus far, with 2 additional units ordered this morning, s/p colonoscopy and EGD on 10/21.  Large amount of old blood in colon from rectum to cecum, multiple diverticula, red blood seen in ileum. Subsequent EGD with moderate erosive  gastritis but no etiology for bleeding.  Patient has had no further evidence of rectal bleeding, Hgb holding steady, and clinically looks much improved from when I initially saw her. Agree with an additional 2 units, which have been ordered by Dr. Ouida Sills. Concern for small bowel etiology at this point, but a capsule study will not be able to be performed until tomorrow, as patient has eaten breakfast.   Plan: Continue to monitor for further evidence of overt bleeding Consider tagged RBC scan if rebleeds Anticipate possible capsule study tomorrow: will discuss further with GI attending  H.pylori serology Follow-up on path from procedures BID PPI  Nira Retort, ANP-BC [facility]     LOS: 3 days    07/29/2013, 8:19 AM  Attending note:  Reviewed RBC scan with Dr. Molli Posey. It was a negative study.  Will proceed with a capsule study of her small bowel tomorrow morning. Diet backed off to clear liquids in preparation for the study. Discussed this approach with the patient and multiple family members at the bedside. All parties agreeable.

## 2013-07-29 NOTE — Progress Notes (Signed)
Patient transferring to room 320. Report given to Charna Busman RN. Patient currently at radiology for tagged RBC scan. First unit of PRBCs, of 2 ordered, infusing now.

## 2013-07-30 ENCOUNTER — Encounter (HOSPITAL_COMMUNITY)
Admission: EM | Disposition: A | Discharge status: Home / Self Care | Payer: Self-pay | Source: Home / Self Care | Attending: Internal Medicine

## 2013-07-30 DIAGNOSIS — K922 Gastrointestinal hemorrhage, unspecified: Secondary | ICD-10-CM

## 2013-07-30 DIAGNOSIS — D649 Anemia, unspecified: Secondary | ICD-10-CM

## 2013-07-30 HISTORY — PX: GIVENS CAPSULE STUDY: SHX5432

## 2013-07-30 LAB — TYPE AND SCREEN
ABO/RH(D): O POS
Unit division: 0
Unit division: 0

## 2013-07-30 SURGERY — IMAGING PROCEDURE, GI TRACT, INTRALUMINAL, VIA CAPSULE

## 2013-07-30 NOTE — Progress Notes (Signed)
NAMEBRANDICE, BUSSER NO.:  [id number]  MEDICAL RECORD NO.:  [id number]  LOCATION:                                 FACILITY:  PHYSICIAN:  Kingsley Callander. Ouida Sills, MD       DATE OF BIRTH:  Sep 07, 1931  DATE OF PROCEDURE:  07/30/2013 DATE OF DISCHARGE:                                PROGRESS NOTE   SUBJECTIVE:  Ms. Henne has had no recurrent active bleeding.  Her tagged red cell study, showed no active bleeding.  She was transfused with an additional 2 units of packed red cells yesterday and her hemoglobin has improved to 11.  OBJECTIVE:  VITAL SIGNS:  Temperature 97.9, pulse 75, blood pressure 153/86. LUNGS:  Clear. HEART:  Regular with no murmurs. ABDOMEN:  Soft and nontender with no palpable organomegaly.  IMPRESSION/PLAN: 1. Gastrointestinal bleed, etiology remains unclear.  A capsule study     is planned for today. 2. Anemia improved following transfusion. 3. Hypertension. 4. Anxiety stable.     Kingsley Callander. Ouida Sills, MD     ROF/MEDQ  D:  07/30/2013  T:  07/30/2013  Job:  [id number]

## 2013-07-30 NOTE — Progress Notes (Signed)
Subjective:  No overt GI bleeding since yesterday AM. Wants to go home. Swallowed sb capsule at 7:30 am. No other complaints.  Objective: Vital signs in last 24 hours: Temp:  [97.9 F (36.6 C)-98.5 F (36.9 C)] 97.9 F (36.6 C) (10/23 0542) Pulse Rate:  [74-82] 75 (10/23 0542) Resp:  [15-18] 17 (10/23 0542) BP: (120-153)/(55-86) 153/86 mmHg (10/23 0542) SpO2:  [96 %-100 %] 100 % (10/23 0542) Weight:  [122 lb (55.339 kg)] 122 lb (55.339 kg) (10/23 0728) Last BM Date: 07/29/13 General:   Alert,  Well-developed, well-nourished, pleasant and cooperative in NAD Head:  Normocephalic and atraumatic. Eyes:  Sclera clear, no icterus.   Abdomen:  Soft, nontender and nondistended. No masses, hepatosplenomegaly or hernias noted. Normal bowel sounds, without guarding, and without rebound.   Extremities:  Without clubbing, deformity or edema. Neurologic:  Alert and  oriented x4;  grossly normal neurologically. Skin:  Intact without significant lesions or rashes. Psych:  Alert and cooperative. Normal mood and affect.  Intake/Output from previous day: 10/22 0701 - 10/23 0700 In: 1412.9 [P.O.:720; I.V.:20; Blood:672.9] Out: -  Intake/Output this shift:    Lab Results: CBC  Recent Labs  07/27/13 1404 07/28/13 0455 07/29/13 0438 07/30/13 0054  WBC 11.8* 6.4 5.0  --   HGB 8.6* 7.0* 7.5* 11.0*  HCT 24.9* 20.4* 21.7* 31.5*  MCV 90.5 91.1 90.0  --   PLT 219 160 123*  --    BMET  Recent Labs  07/28/13 0455  NA 136  K 3.7  CL 105  CO2 22  GLUCOSE 111*  BUN 25*  CREATININE 1.42*  CALCIUM 8.3*   LFTs No results found for this basename: BILITOT, BILIDIR, IBILI, ALKPHOS, AST, ALT, PROT, ALBUMIN,  in the last 72 hours No results found for this basename: LIPASE,  in the last 72 hours PT/INR No results found for this basename: LABPROT, INR,  in the last 72 hours    Imaging Studies: Nm Gi Blood Loss  07/29/2013   CLINICAL DATA:  Rectal bleeding. Bright red blood in terminal ileum  on colonoscopy.  EXAM: NUCLEAR MEDICINE GASTROINTESTINAL BLEEDING SCAN  TECHNIQUE: Sequential abdominal images were obtained following intravenous administration of Tc-43m labeled red blood cells.  COMPARISON:  None.  RADIOPHARMACEUTICALS:  CURIE ULTRATAG TECHNETIUM TC 56M-LABELED RED BLOOD CELLS IV KITmCi Tc-107m in-vitro labeled red cells.  FINDINGS: Imaging was performed for a total of 2 hr. There is good localization of activity the blood pool. No evidence for radiotracer accumulation over the abdomen or pelvis to suggest active gastrointestinal bleeding.  IMPRESSION: No evidence for active gastrointestinal bleeding.   Electronically Signed   By: Kennith Center M.D.   On: 07/29/2013 13:33      Assessment:  79 y/o female with large-volume hematochezia of unclear etiology.  EGD/TCS findings as previously noted. Small bowel capsule study in progress. No notable bleeding in past 24 hours. RBC study yesterday was negative. Total of 4 units of prbcs this admission.   H.pylori serologies negative.   Plan: 1. F/u pending path. Will determine if she will need f/u TCS given poor prep/blood throughout colon. 2. Complete small bowel capsule today. Results to be available tomorrow. As discuss with Dr. Ouida Sills and patient, would be ok to go home this afternoon once capsule study has ended assuming she has had no further bleeding.    LOS: 4 days   [name]  07/30/2013, 9:51 AM

## 2013-07-30 NOTE — Progress Notes (Signed)
Discharge instructions given, verbalized understanding, out in stable condition via w/c with staff. 

## 2013-07-31 NOTE — Op Note (Signed)
Small Bowel Givens Capsule Study Procedure date:  07/30/13  Referring Provider:  Roetta Sessions, MD  PCP:  Dr. [name], MD  Indication for procedure:  79 y/o female with large volume hematochezia of unclear etiology.  Colonoscopy 07/28/13 showed large amount of old blood in colon from rectum to cecum. Red blood seen in the ileum. Unable to advance scope beyond 5-10cm. EGD 07/28/13 showed moderate erosive gastritis. Tagged RBC scan 07/29/13 was negative.   Patient data:  Wt: 122 lb Ht: 62 in Waist: 36 in  Findings:  Single nonbleeding AVM in proximal SB at 38 min 8 sec. Small bowel mucosa was  obscured intermittently by chyme. No evidence of old or fresh blood noted.  First Gastric image:  1 min 48 sec First Duodenal image: 37 min 54 sec First Ileo-Cecal Valve image: 3 hr 38 min 47 sec First Cecal image: 3 hr 47 min 45 sec Gastric Passage time: 36 min Small Bowel Passage time:  3 hr 9 min  Summary & Recommendations: Single nonbleeding AVM in proximal SB, likely unrelated to recent hematochezia. Small bowel source for large volume bleeding not identified.

## 2013-08-03 ENCOUNTER — Encounter (HOSPITAL_COMMUNITY): Payer: Self-pay | Admitting: Gastroenterology

## 2013-08-03 ENCOUNTER — Telehealth: Payer: Self-pay | Admitting: Gastroenterology

## 2013-08-03 NOTE — Telephone Encounter (Signed)
Please call pt'S SON. She had a polypoid lesion, removed FROM HER RECTUM and it IS benign. SHE DOES NOT NEED A TCS AT THIS TIME.

## 2013-08-04 ENCOUNTER — Encounter (HOSPITAL_COMMUNITY): Payer: Self-pay | Admitting: Internal Medicine

## 2013-08-04 NOTE — Telephone Encounter (Signed)
Pt is aware.  

## 2013-08-06 ENCOUNTER — Telehealth: Payer: Self-pay | Admitting: Gastroenterology

## 2013-08-06 DIAGNOSIS — K921 Melena: Secondary | ICD-10-CM

## 2013-08-06 DIAGNOSIS — K5520 Angiodysplasia of colon without hemorrhage: Secondary | ICD-10-CM

## 2013-08-06 NOTE — Telephone Encounter (Signed)
Pt aware of results  Robin Grant   Can you please send Dr. Ouida Sills a copy of the [facility]

## 2013-08-06 NOTE — Telephone Encounter (Signed)
Please let patient know her small bowel capsule test showed a single nonbleeding AVM in proximal SB, likely unrelated to recent bloody stools.   Continue to follow up with Dr. Ouida Sills for anemia. If she has recurrent bleeding let us know.  Please make sure Dr. Ouida Sills gets a copy of the Givens capsule study results.

## 2013-08-10 NOTE — Telephone Encounter (Signed)
CC'd to Dr. Ouida Sills

## 2013-08-11 NOTE — Discharge Summary (Signed)
NAMECICILY, Robin Grant NO.:  [id number]  MEDICAL RECORD NO.:  [id number]  LOCATION:  A320                          FACILITY:  [facility]  PHYSICIAN:  Kingsley Callander. Ouida Sills, MD       DATE OF BIRTH:  1931-09-08  DATE OF ADMISSION:  07/26/2013 DATE OF DISCHARGE:  10/23/2014LH                              DISCHARGE SUMMARY   DISCHARGE DIAGNOSES: 1. Gastrointestinal bleed. 2. Rectal polyp. 3. Moderate erosive gastritis. 4. Acute blood loss anemia. 5. Thrombocytopenia. 6. Chronic kidney disease. 7. Anxiety. 8. Hypertension. 9. Hypothyroidism. 10.Hypokalemia.  HOSPITAL COURSE:  This patient is an 79 year old female who presented to the emergency room with a significant volume of rectal bleeding.  She was hospitalized by the Hospitalist.  Her initial hemoglobin was 13.2. She was hemodynamically stable.  She dropped her hemoglobin from 13.2- 8.7.  She had a known history of diverticulosis from a colonoscopy in 2007.  She had not been on any anticoagulants.  She had not been on aspirin.  Gastroenterology was consulted.  She was hypokalemic at 3.1, which was supplemented to a normal level of 3.6.  Her chronic kidney disease was stable at 1.4.  She underwent an upper endoscopy, which revealed moderate erosive gastritis, but no sign of fresh bleeding.  She underwent an ileocolonoscopy revealing blood from the ileum to the rectum, but no obvious source of acute bleeding.  There was a large amount of blood throughout the colon making it unable to visualize the mucosa completely.  Multiple diverticula were present.  A polypoid lesion was found in the rectum and was biopsied.  Her hemoglobin trended down to 7.  She was transfused with 2 units of packed red cells with a rise in her hemoglobin to 7.5 on the 22nd.  She was transfused with 2 more units of packed red cells.  She underwent a tagged red cell study revealing no evidence of active bleeding.  Her hemoglobin improved to 11.   The decision was made to proceed with a capsule study.  She remained hemodynamically stable.  Her condition improved.  She had no further bleeding.  She was stable for discharge for outpatient followup in 1 week.  DISCHARGE MEDICATIONS: 1. Levothyroxine 50 mcg daily. 2. Losartan 100 mg daily. 3. Prilosec 20 mg twice a day. 4. Crestor 10 mg daily. 5. Zoloft 100 mg daily. 6. Multivitamin daily.     Kingsley Callander. Ouida Sills, MD     ROF/MEDQ  D:  08/10/2013  T:  08/11/2013  Job:  [id number]

## 2014-06-03 ENCOUNTER — Other Ambulatory Visit (HOSPITAL_COMMUNITY): Payer: Self-pay | Admitting: Internal Medicine

## 2014-06-03 DIAGNOSIS — Z1231 Encounter for screening mammogram for malignant neoplasm of breast: Secondary | ICD-10-CM

## 2014-06-09 ENCOUNTER — Ambulatory Visit (HOSPITAL_COMMUNITY)
Admission: RE | Admit: 2014-06-09 | Discharge: 2014-06-09 | Disposition: A | Discharge status: Home / Self Care | Payer: Medicare Other | Source: Ambulatory Visit | Attending: Internal Medicine | Admitting: Internal Medicine

## 2014-06-09 DIAGNOSIS — Z1231 Encounter for screening mammogram for malignant neoplasm of breast: Secondary | ICD-10-CM

## 2015-05-04 DIAGNOSIS — R112 Nausea with vomiting, unspecified: Secondary | ICD-10-CM | POA: Diagnosis not present

## 2015-05-04 DIAGNOSIS — Z6822 Body mass index (BMI) 22.0-22.9, adult: Secondary | ICD-10-CM | POA: Diagnosis not present

## 2015-06-24 DIAGNOSIS — Z79899 Other long term (current) drug therapy: Secondary | ICD-10-CM | POA: Diagnosis not present

## 2015-06-24 DIAGNOSIS — I10 Essential (primary) hypertension: Secondary | ICD-10-CM | POA: Diagnosis not present

## 2015-06-24 DIAGNOSIS — N183 Chronic kidney disease, stage 3 (moderate): Secondary | ICD-10-CM | POA: Diagnosis not present

## 2015-06-24 DIAGNOSIS — E039 Hypothyroidism, unspecified: Secondary | ICD-10-CM | POA: Diagnosis not present

## 2015-07-01 DIAGNOSIS — N183 Chronic kidney disease, stage 3 (moderate): Secondary | ICD-10-CM | POA: Diagnosis not present

## 2015-07-01 DIAGNOSIS — Z23 Encounter for immunization: Secondary | ICD-10-CM | POA: Diagnosis not present

## 2015-07-01 DIAGNOSIS — I10 Essential (primary) hypertension: Secondary | ICD-10-CM | POA: Diagnosis not present

## 2015-07-01 DIAGNOSIS — Z6824 Body mass index (BMI) 24.0-24.9, adult: Secondary | ICD-10-CM | POA: Diagnosis not present

## 2015-07-01 DIAGNOSIS — E785 Hyperlipidemia, unspecified: Secondary | ICD-10-CM | POA: Diagnosis not present

## 2015-08-10 DIAGNOSIS — H04129 Dry eye syndrome of unspecified lacrimal gland: Secondary | ICD-10-CM | POA: Diagnosis not present

## 2015-09-30 DIAGNOSIS — H2512 Age-related nuclear cataract, left eye: Secondary | ICD-10-CM | POA: Diagnosis not present

## 2015-09-30 DIAGNOSIS — H18411 Arcus senilis, right eye: Secondary | ICD-10-CM | POA: Diagnosis not present

## 2015-09-30 DIAGNOSIS — H18412 Arcus senilis, left eye: Secondary | ICD-10-CM | POA: Diagnosis not present

## 2015-09-30 DIAGNOSIS — H2511 Age-related nuclear cataract, right eye: Secondary | ICD-10-CM | POA: Diagnosis not present

## 2015-10-07 DIAGNOSIS — Z79899 Other long term (current) drug therapy: Secondary | ICD-10-CM | POA: Diagnosis not present

## 2015-10-07 DIAGNOSIS — E039 Hypothyroidism, unspecified: Secondary | ICD-10-CM | POA: Diagnosis not present

## 2015-10-14 DIAGNOSIS — I10 Essential (primary) hypertension: Secondary | ICD-10-CM | POA: Diagnosis not present

## 2015-10-14 DIAGNOSIS — E039 Hypothyroidism, unspecified: Secondary | ICD-10-CM | POA: Diagnosis not present

## 2015-10-14 DIAGNOSIS — Z6824 Body mass index (BMI) 24.0-24.9, adult: Secondary | ICD-10-CM | POA: Diagnosis not present

## 2015-10-28 DIAGNOSIS — H401420 Capsular glaucoma with pseudoexfoliation of lens, left eye, stage unspecified: Secondary | ICD-10-CM | POA: Diagnosis not present

## 2015-10-28 DIAGNOSIS — H2512 Age-related nuclear cataract, left eye: Secondary | ICD-10-CM | POA: Diagnosis not present

## 2015-10-28 DIAGNOSIS — H25011 Cortical age-related cataract, right eye: Secondary | ICD-10-CM | POA: Diagnosis not present

## 2015-10-28 DIAGNOSIS — H25012 Cortical age-related cataract, left eye: Secondary | ICD-10-CM | POA: Diagnosis not present

## 2015-10-28 DIAGNOSIS — H2511 Age-related nuclear cataract, right eye: Secondary | ICD-10-CM | POA: Diagnosis not present

## 2016-01-03 DIAGNOSIS — E039 Hypothyroidism, unspecified: Secondary | ICD-10-CM | POA: Diagnosis not present

## 2016-01-03 DIAGNOSIS — Z79899 Other long term (current) drug therapy: Secondary | ICD-10-CM | POA: Diagnosis not present

## 2016-01-10 DIAGNOSIS — Z6824 Body mass index (BMI) 24.0-24.9, adult: Secondary | ICD-10-CM | POA: Diagnosis not present

## 2016-01-10 DIAGNOSIS — N183 Chronic kidney disease, stage 3 (moderate): Secondary | ICD-10-CM | POA: Diagnosis not present

## 2016-01-10 DIAGNOSIS — E039 Hypothyroidism, unspecified: Secondary | ICD-10-CM | POA: Diagnosis not present

## 2016-03-09 DIAGNOSIS — H2511 Age-related nuclear cataract, right eye: Secondary | ICD-10-CM | POA: Diagnosis not present

## 2016-03-09 DIAGNOSIS — H25012 Cortical age-related cataract, left eye: Secondary | ICD-10-CM | POA: Diagnosis not present

## 2016-03-09 DIAGNOSIS — H25011 Cortical age-related cataract, right eye: Secondary | ICD-10-CM | POA: Diagnosis not present

## 2016-03-09 DIAGNOSIS — H2512 Age-related nuclear cataract, left eye: Secondary | ICD-10-CM | POA: Diagnosis not present

## 2016-05-31 DIAGNOSIS — H401420 Capsular glaucoma with pseudoexfoliation of lens, left eye, stage unspecified: Secondary | ICD-10-CM | POA: Diagnosis not present

## 2016-06-07 ENCOUNTER — Encounter: Payer: Self-pay | Admitting: Internal Medicine

## 2016-10-04 DIAGNOSIS — H348192 Central retinal vein occlusion, unspecified eye, stable: Secondary | ICD-10-CM | POA: Diagnosis not present

## 2016-10-04 DIAGNOSIS — H2513 Age-related nuclear cataract, bilateral: Secondary | ICD-10-CM | POA: Diagnosis not present

## 2016-10-04 DIAGNOSIS — H401421 Capsular glaucoma with pseudoexfoliation of lens, left eye, mild stage: Secondary | ICD-10-CM | POA: Diagnosis not present

## 2016-10-09 ENCOUNTER — Encounter (INDEPENDENT_AMBULATORY_CARE_PROVIDER_SITE_OTHER): Payer: Medicare HMO | Admitting: Ophthalmology

## 2016-10-09 DIAGNOSIS — H34812 Central retinal vein occlusion, left eye, with macular edema: Secondary | ICD-10-CM

## 2016-10-09 DIAGNOSIS — I10 Essential (primary) hypertension: Secondary | ICD-10-CM

## 2016-10-09 DIAGNOSIS — H43813 Vitreous degeneration, bilateral: Secondary | ICD-10-CM | POA: Diagnosis not present

## 2016-10-09 DIAGNOSIS — H35033 Hypertensive retinopathy, bilateral: Secondary | ICD-10-CM

## 2016-11-05 ENCOUNTER — Encounter (INDEPENDENT_AMBULATORY_CARE_PROVIDER_SITE_OTHER): Payer: Medicare HMO | Admitting: Ophthalmology

## 2016-11-05 DIAGNOSIS — H34812 Central retinal vein occlusion, left eye, with macular edema: Secondary | ICD-10-CM

## 2016-11-05 DIAGNOSIS — H43813 Vitreous degeneration, bilateral: Secondary | ICD-10-CM

## 2016-11-05 DIAGNOSIS — H35033 Hypertensive retinopathy, bilateral: Secondary | ICD-10-CM

## 2016-11-05 DIAGNOSIS — H2513 Age-related nuclear cataract, bilateral: Secondary | ICD-10-CM

## 2016-11-05 DIAGNOSIS — I10 Essential (primary) hypertension: Secondary | ICD-10-CM | POA: Diagnosis not present

## 2016-12-03 ENCOUNTER — Encounter (INDEPENDENT_AMBULATORY_CARE_PROVIDER_SITE_OTHER): Payer: Medicare HMO | Admitting: Ophthalmology

## 2016-12-03 DIAGNOSIS — H34812 Central retinal vein occlusion, left eye, with macular edema: Secondary | ICD-10-CM | POA: Diagnosis not present

## 2016-12-03 DIAGNOSIS — H35033 Hypertensive retinopathy, bilateral: Secondary | ICD-10-CM | POA: Diagnosis not present

## 2016-12-03 DIAGNOSIS — H43813 Vitreous degeneration, bilateral: Secondary | ICD-10-CM

## 2016-12-03 DIAGNOSIS — H2513 Age-related nuclear cataract, bilateral: Secondary | ICD-10-CM | POA: Diagnosis not present

## 2016-12-03 DIAGNOSIS — I10 Essential (primary) hypertension: Secondary | ICD-10-CM

## 2016-12-28 ENCOUNTER — Encounter (INDEPENDENT_AMBULATORY_CARE_PROVIDER_SITE_OTHER): Payer: Medicare HMO | Admitting: Ophthalmology

## 2016-12-28 DIAGNOSIS — H34812 Central retinal vein occlusion, left eye, with macular edema: Secondary | ICD-10-CM | POA: Diagnosis not present

## 2016-12-28 DIAGNOSIS — I10 Essential (primary) hypertension: Secondary | ICD-10-CM | POA: Diagnosis not present

## 2016-12-28 DIAGNOSIS — H35033 Hypertensive retinopathy, bilateral: Secondary | ICD-10-CM | POA: Diagnosis not present

## 2016-12-28 DIAGNOSIS — H43813 Vitreous degeneration, bilateral: Secondary | ICD-10-CM

## 2017-02-01 ENCOUNTER — Encounter (INDEPENDENT_AMBULATORY_CARE_PROVIDER_SITE_OTHER): Payer: Medicare HMO | Admitting: Ophthalmology

## 2017-02-01 DIAGNOSIS — H34812 Central retinal vein occlusion, left eye, with macular edema: Secondary | ICD-10-CM

## 2017-02-01 DIAGNOSIS — I10 Essential (primary) hypertension: Secondary | ICD-10-CM | POA: Diagnosis not present

## 2017-02-01 DIAGNOSIS — H2513 Age-related nuclear cataract, bilateral: Secondary | ICD-10-CM

## 2017-02-01 DIAGNOSIS — H35033 Hypertensive retinopathy, bilateral: Secondary | ICD-10-CM

## 2017-02-01 DIAGNOSIS — H43813 Vitreous degeneration, bilateral: Secondary | ICD-10-CM | POA: Diagnosis not present

## 2017-03-08 ENCOUNTER — Encounter (INDEPENDENT_AMBULATORY_CARE_PROVIDER_SITE_OTHER): Payer: Medicare HMO | Admitting: Ophthalmology

## 2017-03-12 DIAGNOSIS — R5383 Other fatigue: Secondary | ICD-10-CM | POA: Diagnosis not present

## 2017-03-12 DIAGNOSIS — Z6823 Body mass index (BMI) 23.0-23.9, adult: Secondary | ICD-10-CM | POA: Diagnosis not present

## 2017-03-12 DIAGNOSIS — N183 Chronic kidney disease, stage 3 (moderate): Secondary | ICD-10-CM | POA: Diagnosis not present

## 2017-03-12 DIAGNOSIS — E039 Hypothyroidism, unspecified: Secondary | ICD-10-CM | POA: Diagnosis not present

## 2017-03-13 DIAGNOSIS — Z79899 Other long term (current) drug therapy: Secondary | ICD-10-CM | POA: Diagnosis not present

## 2017-03-13 DIAGNOSIS — N183 Chronic kidney disease, stage 3 (moderate): Secondary | ICD-10-CM | POA: Diagnosis not present

## 2017-03-13 DIAGNOSIS — R5383 Other fatigue: Secondary | ICD-10-CM | POA: Diagnosis not present

## 2017-03-13 DIAGNOSIS — E039 Hypothyroidism, unspecified: Secondary | ICD-10-CM | POA: Diagnosis not present

## 2017-03-18 ENCOUNTER — Encounter (INDEPENDENT_AMBULATORY_CARE_PROVIDER_SITE_OTHER): Payer: Medicare HMO | Admitting: Ophthalmology

## 2017-03-18 DIAGNOSIS — I10 Essential (primary) hypertension: Secondary | ICD-10-CM | POA: Diagnosis not present

## 2017-03-18 DIAGNOSIS — H43813 Vitreous degeneration, bilateral: Secondary | ICD-10-CM | POA: Diagnosis not present

## 2017-03-18 DIAGNOSIS — H34812 Central retinal vein occlusion, left eye, with macular edema: Secondary | ICD-10-CM

## 2017-03-18 DIAGNOSIS — H35033 Hypertensive retinopathy, bilateral: Secondary | ICD-10-CM | POA: Diagnosis not present

## 2017-05-13 ENCOUNTER — Encounter (INDEPENDENT_AMBULATORY_CARE_PROVIDER_SITE_OTHER): Payer: Medicare HMO | Admitting: Ophthalmology

## 2017-05-13 DIAGNOSIS — H43813 Vitreous degeneration, bilateral: Secondary | ICD-10-CM | POA: Diagnosis not present

## 2017-05-13 DIAGNOSIS — H2513 Age-related nuclear cataract, bilateral: Secondary | ICD-10-CM

## 2017-05-13 DIAGNOSIS — H35033 Hypertensive retinopathy, bilateral: Secondary | ICD-10-CM

## 2017-05-13 DIAGNOSIS — H34812 Central retinal vein occlusion, left eye, with macular edema: Secondary | ICD-10-CM

## 2017-05-13 DIAGNOSIS — I10 Essential (primary) hypertension: Secondary | ICD-10-CM | POA: Diagnosis not present

## 2017-05-29 DIAGNOSIS — H25013 Cortical age-related cataract, bilateral: Secondary | ICD-10-CM | POA: Diagnosis not present

## 2017-05-29 DIAGNOSIS — H2511 Age-related nuclear cataract, right eye: Secondary | ICD-10-CM | POA: Diagnosis not present

## 2017-05-29 DIAGNOSIS — H2513 Age-related nuclear cataract, bilateral: Secondary | ICD-10-CM | POA: Diagnosis not present

## 2017-05-29 DIAGNOSIS — H401421 Capsular glaucoma with pseudoexfoliation of lens, left eye, mild stage: Secondary | ICD-10-CM | POA: Diagnosis not present

## 2017-05-29 DIAGNOSIS — H25043 Posterior subcapsular polar age-related cataract, bilateral: Secondary | ICD-10-CM | POA: Diagnosis not present

## 2017-05-29 DIAGNOSIS — H25011 Cortical age-related cataract, right eye: Secondary | ICD-10-CM | POA: Diagnosis not present

## 2017-06-11 DIAGNOSIS — E039 Hypothyroidism, unspecified: Secondary | ICD-10-CM | POA: Diagnosis not present

## 2017-06-11 DIAGNOSIS — Z79899 Other long term (current) drug therapy: Secondary | ICD-10-CM | POA: Diagnosis not present

## 2017-06-11 DIAGNOSIS — I10 Essential (primary) hypertension: Secondary | ICD-10-CM | POA: Diagnosis not present

## 2017-06-17 DIAGNOSIS — F419 Anxiety disorder, unspecified: Secondary | ICD-10-CM | POA: Diagnosis not present

## 2017-06-17 DIAGNOSIS — Z6823 Body mass index (BMI) 23.0-23.9, adult: Secondary | ICD-10-CM | POA: Diagnosis not present

## 2017-06-17 DIAGNOSIS — Z23 Encounter for immunization: Secondary | ICD-10-CM | POA: Diagnosis not present

## 2017-06-17 DIAGNOSIS — E039 Hypothyroidism, unspecified: Secondary | ICD-10-CM | POA: Diagnosis not present

## 2017-06-17 DIAGNOSIS — N184 Chronic kidney disease, stage 4 (severe): Secondary | ICD-10-CM | POA: Diagnosis not present

## 2017-06-18 DIAGNOSIS — H2511 Age-related nuclear cataract, right eye: Secondary | ICD-10-CM | POA: Diagnosis not present

## 2017-06-19 ENCOUNTER — Other Ambulatory Visit (HOSPITAL_COMMUNITY): Payer: Self-pay | Admitting: Internal Medicine

## 2017-06-19 DIAGNOSIS — N183 Chronic kidney disease, stage 3 unspecified: Secondary | ICD-10-CM

## 2017-06-19 DIAGNOSIS — H2512 Age-related nuclear cataract, left eye: Secondary | ICD-10-CM | POA: Diagnosis not present

## 2017-06-25 ENCOUNTER — Ambulatory Visit (HOSPITAL_COMMUNITY): Admission: RE | Admit: 2017-06-25 | Payer: Medicare HMO | Source: Ambulatory Visit

## 2017-06-25 DIAGNOSIS — H2512 Age-related nuclear cataract, left eye: Secondary | ICD-10-CM | POA: Diagnosis not present

## 2017-06-28 ENCOUNTER — Ambulatory Visit (HOSPITAL_COMMUNITY)
Admission: RE | Admit: 2017-06-28 | Discharge: 2017-06-28 | Disposition: A | Discharge status: Home / Self Care | Payer: Medicare HMO | Source: Ambulatory Visit | Attending: Internal Medicine | Admitting: Internal Medicine

## 2017-06-28 DIAGNOSIS — N183 Chronic kidney disease, stage 3 unspecified: Secondary | ICD-10-CM

## 2017-06-28 DIAGNOSIS — N281 Cyst of kidney, acquired: Secondary | ICD-10-CM | POA: Insufficient documentation

## 2017-07-02 DIAGNOSIS — H25812 Combined forms of age-related cataract, left eye: Secondary | ICD-10-CM | POA: Diagnosis not present

## 2017-07-08 ENCOUNTER — Encounter (INDEPENDENT_AMBULATORY_CARE_PROVIDER_SITE_OTHER): Payer: Medicare HMO | Admitting: Ophthalmology

## 2017-07-08 DIAGNOSIS — H43813 Vitreous degeneration, bilateral: Secondary | ICD-10-CM | POA: Diagnosis not present

## 2017-07-08 DIAGNOSIS — I10 Essential (primary) hypertension: Secondary | ICD-10-CM

## 2017-07-08 DIAGNOSIS — H35033 Hypertensive retinopathy, bilateral: Secondary | ICD-10-CM | POA: Diagnosis not present

## 2017-07-08 DIAGNOSIS — H34812 Central retinal vein occlusion, left eye, with macular edema: Secondary | ICD-10-CM | POA: Diagnosis not present

## 2017-07-19 ENCOUNTER — Observation Stay (HOSPITAL_COMMUNITY)
Admission: EM | Admit: 2017-07-19 | Discharge: 2017-07-21 | Disposition: A | Discharge status: Home / Self Care | Payer: Medicare HMO | Attending: Internal Medicine | Admitting: Internal Medicine

## 2017-07-19 ENCOUNTER — Emergency Department (HOSPITAL_COMMUNITY): Payer: Medicare HMO

## 2017-07-19 ENCOUNTER — Encounter (HOSPITAL_COMMUNITY): Payer: Self-pay | Admitting: Emergency Medicine

## 2017-07-19 DIAGNOSIS — R079 Chest pain, unspecified: Principal | ICD-10-CM | POA: Diagnosis present

## 2017-07-19 DIAGNOSIS — I10 Essential (primary) hypertension: Secondary | ICD-10-CM | POA: Diagnosis not present

## 2017-07-19 DIAGNOSIS — E039 Hypothyroidism, unspecified: Secondary | ICD-10-CM | POA: Insufficient documentation

## 2017-07-19 DIAGNOSIS — R0789 Other chest pain: Secondary | ICD-10-CM | POA: Diagnosis not present

## 2017-07-19 DIAGNOSIS — Z87891 Personal history of nicotine dependence: Secondary | ICD-10-CM | POA: Diagnosis not present

## 2017-07-19 DIAGNOSIS — N183 Chronic kidney disease, stage 3 unspecified: Secondary | ICD-10-CM | POA: Diagnosis present

## 2017-07-19 DIAGNOSIS — Z79899 Other long term (current) drug therapy: Secondary | ICD-10-CM | POA: Insufficient documentation

## 2017-07-19 DIAGNOSIS — E782 Mixed hyperlipidemia: Secondary | ICD-10-CM | POA: Diagnosis not present

## 2017-07-19 DIAGNOSIS — I129 Hypertensive chronic kidney disease with stage 1 through stage 4 chronic kidney disease, or unspecified chronic kidney disease: Secondary | ICD-10-CM | POA: Insufficient documentation

## 2017-07-19 DIAGNOSIS — E785 Hyperlipidemia, unspecified: Secondary | ICD-10-CM | POA: Insufficient documentation

## 2017-07-19 DIAGNOSIS — R0602 Shortness of breath: Secondary | ICD-10-CM | POA: Diagnosis not present

## 2017-07-19 DIAGNOSIS — R072 Precordial pain: Secondary | ICD-10-CM | POA: Diagnosis not present

## 2017-07-19 LAB — CBC
HEMATOCRIT: 41.5 % (ref 36.0–46.0)
Hemoglobin: 14.1 g/dL (ref 12.0–15.0)
MCH: 31 pg (ref 26.0–34.0)
MCHC: 34 g/dL (ref 30.0–36.0)
MCV: 91.2 fL (ref 78.0–100.0)
PLATELETS: 175 10*3/uL (ref 150–400)
RBC: 4.55 MIL/uL (ref 3.87–5.11)
RDW: 13.3 % (ref 11.5–15.5)
WBC: 7.8 10*3/uL (ref 4.0–10.5)

## 2017-07-19 LAB — BASIC METABOLIC PANEL
Anion gap: 11 (ref 5–15)
BUN: 12 mg/dL (ref 6–20)
CO2: 23 mmol/L (ref 22–32)
CREATININE: 1.01 mg/dL — AB (ref 0.44–1.00)
Calcium: 9.8 mg/dL (ref 8.9–10.3)
Chloride: 104 mmol/L (ref 101–111)
GFR calc Af Amer: 58 mL/min — ABNORMAL LOW (ref >60)
GFR, EST NON AFRICAN AMERICAN: 50 mL/min — AB (ref >60)
GLUCOSE: 101 mg/dL — AB (ref 65–99)
Potassium: 3.2 mmol/L — ABNORMAL LOW (ref 3.5–5.1)
SODIUM: 138 mmol/L (ref 135–145)

## 2017-07-19 LAB — D-DIMER, QUANTITATIVE (NOT AT ARMC): D DIMER QUANT: 0.4 ug{FEU}/mL (ref 0.00–0.50)

## 2017-07-19 LAB — TROPONIN I
Troponin I: <0.03 ng/mL (ref <0.03)
Troponin I: <0.03 ng/mL (ref <0.03)

## 2017-07-19 MED ORDER — ASPIRIN 81 MG PO CHEW
324.0000 mg | CHEWABLE_TABLET | Freq: Once | ORAL | Status: AC
  Administered 2017-07-19: 324 mg via ORAL
  Filled 2017-07-19: qty 4

## 2017-07-19 MED ORDER — ENOXAPARIN SODIUM 40 MG/0.4ML ~~LOC~~ SOLN: 40.0000 mg | SUBCUTANEOUS | Status: DC

## 2017-07-19 MED ORDER — ONDANSETRON HCL 4 MG/2ML IJ SOLN: 4.0000 mg | Freq: Four times a day (QID) | INTRAMUSCULAR | Status: DC | PRN

## 2017-07-19 MED ORDER — ADULT MULTIVITAMIN W/MINERALS CH
1.0000 | ORAL_TABLET | Freq: Every day | ORAL | Status: DC
  Administered 2017-07-20 – 2017-07-21 (×2): 1 via ORAL
  Filled 2017-07-19 (×2): qty 1

## 2017-07-19 MED ORDER — ONDANSETRON HCL 4 MG PO TABS: 4.0000 mg | ORAL_TABLET | Freq: Four times a day (QID) | ORAL | Status: DC | PRN

## 2017-07-19 MED ORDER — ACETAMINOPHEN 325 MG PO TABS: 650.0000 mg | ORAL_TABLET | ORAL | Status: DC | PRN

## 2017-07-19 MED ORDER — ACETAMINOPHEN 325 MG PO TABS: 650.0000 mg | ORAL_TABLET | Freq: Four times a day (QID) | ORAL | Status: DC | PRN

## 2017-07-19 MED ORDER — LOSARTAN POTASSIUM 50 MG PO TABS
100.0000 mg | ORAL_TABLET | Freq: Every day | ORAL | Status: DC
  Administered 2017-07-20 – 2017-07-21 (×2): 100 mg via ORAL
  Filled 2017-07-19 (×2): qty 2

## 2017-07-19 MED ORDER — SERTRALINE HCL 50 MG PO TABS
100.0000 mg | ORAL_TABLET | Freq: Every day | ORAL | Status: DC
  Administered 2017-07-20 – 2017-07-21 (×2): 100 mg via ORAL
  Filled 2017-07-19 (×2): qty 2

## 2017-07-19 MED ORDER — ENOXAPARIN SODIUM 40 MG/0.4ML ~~LOC~~ SOLN
SUBCUTANEOUS | Status: AC
  Filled 2017-07-19: qty 0.4

## 2017-07-19 MED ORDER — ENOXAPARIN SODIUM 40 MG/0.4ML ~~LOC~~ SOLN
40.0000 mg | SUBCUTANEOUS | Status: DC
  Administered 2017-07-19 – 2017-07-20 (×2): 40 mg via SUBCUTANEOUS
  Filled 2017-07-19: qty 0.4

## 2017-07-19 MED ORDER — ASPIRIN 325 MG PO TABS
325.0000 mg | ORAL_TABLET | Freq: Every day | ORAL | Status: DC
  Administered 2017-07-20 – 2017-07-21 (×2): 325 mg via ORAL
  Filled 2017-07-19 (×2): qty 1

## 2017-07-19 MED ORDER — POTASSIUM CHLORIDE CRYS ER 20 MEQ PO TBCR
40.0000 meq | EXTENDED_RELEASE_TABLET | Freq: Once | ORAL | Status: AC
  Administered 2017-07-19: 40 meq via ORAL
  Filled 2017-07-19: qty 2

## 2017-07-19 MED ORDER — LEVOTHYROXINE SODIUM 88 MCG PO TABS
88.0000 ug | ORAL_TABLET | Freq: Every day | ORAL | Status: DC
  Administered 2017-07-20 – 2017-07-21 (×2): 88 ug via ORAL
  Filled 2017-07-19 (×2): qty 1

## 2017-07-19 MED ORDER — ACETAMINOPHEN 650 MG RE SUPP: 650.0000 mg | Freq: Four times a day (QID) | RECTAL | Status: DC | PRN

## 2017-07-19 MED ORDER — LORAZEPAM 0.5 MG PO TABS: 0.2500 mg | ORAL_TABLET | Freq: Two times a day (BID) | ORAL | Status: DC | PRN

## 2017-07-19 MED ORDER — PANTOPRAZOLE SODIUM 40 MG PO TBEC
40.0000 mg | DELAYED_RELEASE_TABLET | Freq: Every day | ORAL | Status: DC
  Administered 2017-07-19 – 2017-07-21 (×3): 40 mg via ORAL
  Filled 2017-07-19 (×3): qty 1

## 2017-07-19 MED ORDER — ONDANSETRON HCL 4 MG/2ML IJ SOLN
4.0000 mg | Freq: Four times a day (QID) | INTRAMUSCULAR | Status: DC | PRN
Start: 2017-07-19 — End: 2017-07-21

## 2017-07-19 NOTE — H&P (Signed)
History and Physical  Robin Grant ZOX:[id number] DOB: Dec 22, 1930 DOA: 07/19/2017   PCP: [name], MD   Patient coming from: Home  Chief Complaint: chest pain  HPI:  Robin Grant is a 83 y.o. female with medical history of essential hypertension, hypothyroidism, hyperlipidemia presenting with 6 month history of exertional chest discomfort that worsened on the evening on 07/18/2017.  The patient states that she had a dull substernal chest pain that radiated to her neck on the evening of 07/18/2017. The patient has some associated dizziness. She stated that it eventually subsided after a few hours. She stated that taking deep breath made worse. Her son came to check on her on the morning of 07/19/2017. When she notified him of her chest pain, the patient was brought to emergency department for further evaluation. However, over the past 6 months she has described substernal chest discomfort with her usual activities of daily living which improves with resting. She also feels that her endurance has not been as good in recent weeks. The patient denies any previous history of coronary artery disease. She denies any family history of premature coronary artery disease. She has a remote history of tobacco.  In the emergency room, the patient was afebrile and hemodynamically stable and pain-free. EKG was sinus rhythm with nonspecific T-wave changes. Initial troponin was negative. BMP and CBC were unremarkable except for potassium of 3.2. Chest x-ray was negative for any infiltrates or edema.   Assessment/Plan: Chest pain -The patient has typical and atypical components -Consult cardiology -Cycle troponins -Echocardiogram -Check d-dimer  CKD stage 3 -baseline creatinine 1.0-1.3  Essential hypertension -Continue losartan  Hyperlipidemia -Continue Crestor  Hypokalemia -Replete -Check magnesium  Hypothyroidism -Continue Synthroid        Past Medical History:  Diagnosis  Date  . Anxiety   . Chronic constipation   . HTN (hypertension)   . Hyperlipidemia   . Hypothyroidism   . Insomnia    Past Surgical History:  Procedure Laterality Date  . APPENDECTOMY    . COLONOSCOPY  2007   Dr. [name], pancolonic diverticula.  . COLONOSCOPY N/A 07/28/2013   Procedure: COLONOSCOPY;  Surgeon: West Bali, MD;  Location: [facility];  Service: Endoscopy;  Laterality: N/A;  PLEASE SCHEDULE AT 3 PM PER DR. FIELDS   . ESOPHAGOGASTRODUODENOSCOPY  05/21/2011   Dr. [name]: normal tubular esophagus, s/p empiric dilation with 9 F, innocent appearing AVM, small hiatal hernia  . GIVENS CAPSULE STUDY N/A 07/30/2013   Procedure: GIVENS CAPSULE STUDY;  Surgeon: [name], MD;  Location: [facility];  Service: Endoscopy;  Laterality: N/A;  . PORT-A-CATH REMOVAL  2008  . right ankle surgery  2007   osteomyelitis  . TUBAL LIGATION     Social History:  reports that she has quit smoking. She does not have any smokeless tobacco history on file. She reports that she does not drink alcohol or use drugs.   Family History  Problem Relation Age of Onset  . Breast cancer Mother        deceased from MI  . Stroke Father        MI too  . Colon cancer Maternal Aunt   . Rheumatic fever Sister 75       open heart surgery age 62, deceased age 40  . Lung cancer Sister   . Heart attack Sister   . Kidney disease Sister        born with one kidney, required  transplant     Allergies  Allergen Reactions  . Hydrocodone Nausea And Vomiting  . Codeine Nausea And Vomiting     Prior to Admission medications   Medication Sig Start Date End Date Taking? Authorizing Provider  levothyroxine (SYNTHROID, LEVOTHROID) 88 MCG tablet Take 88 mcg by mouth daily. 05/28/17  Yes [provider]  LORazepam (ATIVAN) 0.5 MG tablet Take 0.5-1 tablets by mouth 2 (two) times daily as needed for anxiety or sleep.  04/18/17  Yes [provider]  losartan (COZAAR) 100 MG tablet Take 100  mg by mouth daily.     Yes [provider]  Multiple Vitamin (MULTIVITAMIN) capsule Take 1 capsule by mouth daily.     Yes [provider]  omeprazole (PRILOSEC) 20 MG capsule Take 20 mg by mouth 2 (two) times daily.    Yes [provider]  sertraline (ZOLOFT) 100 MG tablet Take 100 mg by mouth daily.   Yes [provider]    Review of Systems:  Constitutional:  No weight loss, night sweats, Fevers, chills, fatigue.  Head&Eyes: No headache.  No vision loss.  No eye pain or scotoma ENT:  No Difficulty swallowing,Tooth/dental problems,Sore throat,  No ear ache, post nasal drip,  Cardio-vascular:  No Orthopnea, PND, swelling in lower extremities,  dizziness, palpitations  GI:  No  abdominal pain, nausea, vomiting, diarrhea, loss of appetite, hematochezia, melena, heartburn, indigestion, Resp:  No shortness of breath with exertion or at rest. No cough. No coughing up of blood .No wheezing.No chest wall deformity  Skin:  no rash or lesions.  GU:  no dysuria, change in color of urine, no urgency or frequency. No flank pain.  Musculoskeletal:  No joint pain or swelling. No decreased range of motion. No back pain.  Psych:  No change in mood or affect. No depression or anxiety. Neurologic: No headache, no dysesthesia, no focal weakness, no vision loss. No syncope  Physical Exam: Vitals:   07/19/17 1030 07/19/17 1045 07/19/17 1047 07/19/17 1050  BP: (!) 153/91  (!) 153/91 (!) 153/91  Pulse:  81 77 77  Resp: (!) 22 (!) Temp:      TempSrc:      SpO2:  96%  96%  Weight:      Height:       General:  A&O x 3, NAD, nontoxic, pleasant/cooperative Head/Eye: No conjunctival hemorrhage, no icterus, [place]/AT, No nystagmus ENT:  No icterus,  No thrush, good dentition, no pharyngeal exudate Neck:  No masses, no lymphadenpathy, no bruits CV:  RRR, no rub, no gallop, no S3 Lung:  CTAB, good air movement, no wheeze, no rhonchi Abdomen: soft/NT, +BS,  nondistended, no peritoneal signs Ext: No cyanosis, No rashes, No petechiae, No lymphangitis, No edema Neuro: CNII-XII intact, strength 4/5 in bilateral upper and lower extremities, no dysmetria  Labs on Admission:  Basic Metabolic Panel:  Recent Labs Lab 07/19/17 0958  NA 138  K 3.2*  CL 104  CO2 23  GLUCOSE 101*  BUN 12  CREATININE 1.01*  CALCIUM 9.8   Liver Function Tests: No results for input(s): AST, ALT, ALKPHOS, BILITOT, PROT, ALBUMIN in the last 168 hours. No results for input(s): LIPASE, AMYLASE in the last 168 hours. No results for input(s): AMMONIA in the last 168 hours. CBC:  Recent Labs Lab 07/19/17 0958  WBC 7.8  HGB 14.1  HCT 41.5  MCV 91.2  PLT 175   Coagulation Profile: No results for input(s): INR, PROTIME in  the last 168 hours. Cardiac Enzymes:  Recent Labs Lab 07/19/17 0958  TROPONINI <0.03   BNP: Invalid input(s): POCBNP CBG: No results for input(s): GLUCAP in the last 168 hours. Urine analysis: No results found for: COLORURINE, APPEARANCEUR, LABSPEC, PHURINE, GLUCOSEU, HGBUR, BILIRUBINUR, KETONESUR, PROTEINUR, UROBILINOGEN, NITRITE, LEUKOCYTESUR Sepsis Labs: (procalcitonin:4,lacticidven:4) )No results found for this or any previous visit (from the past 240 hour(s)).   Radiological Exams on Admission: Dg Chest 2 View  Result Date: 07/19/2017 CLINICAL DATA:  Chest pain, shortness of breath, dizziness EXAM: CHEST  2 VIEW COMPARISON:  07/26/2013 FINDINGS: The lungs are hyperinflated likely secondary to COPD. There is no focal parenchymal opacity. There is no pleural effusion or pneumothorax. The heart and mediastinal contours are unremarkable. The osseous structures are unremarkable. IMPRESSION: No active cardiopulmonary disease. Electronically Signed   By: Elige Ko   On: 07/19/2017 10:46    EKG: Independently reviewed. Sinus rhythm, nonspecific T-wave change    Time spent:60 minutes Code Status:   FULL Family  Communication:  Son updated at bedside 10/12 Disposition Plan: expect 1 day hospitalization Consults called: cardiology DVT Prophylaxis: [place] Lovenox  Kellianne Ek, DO  [facility] Pager [phone]  If 7PM-7AM, please contact night-coverage [email or web address] Password [email or web address] 07/19/2017, 11:40 AM

## 2017-07-19 NOTE — Consult Note (Signed)
Cardiology Consultation:   Patient ID: Robin Grant; [id number]; 1931-06-15   Admit date: 07/19/2017 Date of Consult: 07/19/2017  Primary Care Provider: [name], MD Primary Cardiologist: New/Mcdonald Reiling Primary Electrophysiologist:  None  History of Present Illness:   Robin Grant is a 83 y.o. female with a hx of HTN and HLD. No history of vascular or CAD  who is being seen today for the evaluation of chest pain at the request of Dr Tat Patient has been having some exertional dyspnea and chest tightness for about 6 months. Pain not always with activity Sometimes worse when she lays down at night She had continues SSCP from mid night to ER visit. Not pleuritic or positional She did not get nitro. Despite prolonged pain ECG with no acute changes and troponin negative She is compliant with her meds. Pain radiated to shoulders/neck some Her sone looked in on her this am and brought her to ER He sees Dr Darrow Bussing and has had stents And CABG so he was worried. She is currently pain free .  Past Medical History:  Diagnosis Date  . Anxiety   . Chronic constipation   . HTN (hypertension)   . Hyperlipidemia   . Hypothyroidism   . Insomnia     Past Surgical History:  Procedure Laterality Date  . APPENDECTOMY    . COLONOSCOPY  2007   Dr. [name], pancolonic diverticula.  . COLONOSCOPY N/A 07/28/2013   Procedure: COLONOSCOPY;  Surgeon: West Bali, MD;  Location: [facility];  Service: Endoscopy;  Laterality: N/A;  PLEASE SCHEDULE AT 3 PM PER DR. FIELDS   . ESOPHAGOGASTRODUODENOSCOPY  05/21/2011   Dr. [name]: normal tubular esophagus, s/p empiric dilation with 11 F, innocent appearing AVM, small hiatal hernia  . GIVENS CAPSULE STUDY N/A 07/30/2013   Procedure: GIVENS CAPSULE STUDY;  Surgeon: [name], MD;  Location: [facility];  Service: Endoscopy;  Laterality: N/A;  . PORT-A-CATH REMOVAL  2008  . right ankle surgery  2007   osteomyelitis  . TUBAL LIGATION       Home  Medications:  Prior to Admission medications   Medication Sig Start Date End Date Taking? Authorizing Provider  levothyroxine (SYNTHROID, LEVOTHROID) 88 MCG tablet Take 88 mcg by mouth daily. 05/28/17  Yes [provider]  LORazepam (ATIVAN) 0.5 MG tablet Take 0.5-1 tablets by mouth 2 (two) times daily as needed for anxiety or sleep.  04/18/17  Yes [provider]  losartan (COZAAR) 100 MG tablet Take 100 mg by mouth daily.     Yes [provider]  Multiple Vitamin (MULTIVITAMIN) capsule Take 1 capsule by mouth daily.     Yes [provider]  omeprazole (PRILOSEC) 20 MG capsule Take 20 mg by mouth 2 (two) times daily.    Yes [provider]  sertraline (ZOLOFT) 100 MG tablet Take 100 mg by mouth daily.   Yes [provider]    Inpatient Medications: Scheduled Meds: . enoxaparin (LOVENOX) injection  40 mg Subcutaneous Q24H   Continuous Infusions:  PRN Meds: acetaminophen, ondansetron (ZOFRAN) IV  Allergies:    Allergies  Allergen Reactions  . Hydrocodone Nausea And Vomiting  . Codeine Nausea And Vomiting    Social History:   Social History   Social History  . Marital status: Widowed    Spouse name: N/A  . Number of children: 2  . Years of education: N/A   Occupational History  .  retired Retired    [employer]  Social History Main Topics  . Smoking status: Former Games developer  . Smokeless tobacco: Not on file  . Alcohol use No  . Drug use: No  . Sexual activity: Not on file   Other Topics Concern  . Not on file   Social History Narrative  . No narrative on file    Family History:    Family History  Problem Relation Age of Onset  . Breast cancer Mother        deceased from MI  . Stroke Father        MI too  . Colon cancer Maternal Aunt   . Rheumatic fever Sister 38       open heart surgery age 67, deceased age 67  . Lung cancer Sister   . Heart attack Sister   . Kidney disease Sister        born  with one kidney, required transplant     ROS:  Please see the history of present illness.  ROS  All other ROS reviewed and negative.     Physical Exam/Data:   Vitals:   07/19/17 1300 07/19/17 1330 07/19/17 1400 07/19/17 1430  BP: 136/78 140/88 138/83 129/80  Pulse: 72 71 73 70  Resp: Temp:      TempSrc:      SpO2: 98% 98% 97% 98%  Weight:      Height:       No intake or output data in the 24 hours ending 07/19/17 1459 Filed Weights   07/19/17 0953  Weight: 125 lb (56.7 kg)   Body mass index is 22.86 kg/m.  General:  Well nourished, well developed, in no acute distress  HEENT: normal Lymph: no adenopathy Neck: no JVD Endocrine:  No thryomegaly Vascular: No carotid bruits; FA pulses 2+ bilaterally without bruits  Cardiac:  normal S1, S2; RRR; no murmur   Lungs:  clear to auscultation bilaterally, no wheezing, rhonchi or rales  Abd: soft, nontender, no hepatomegaly  Ext: no edema Musculoskeletal:  No deformities, BUE and BLE strength normal and equal Skin: warm and dry  Neuro:  CNs 2-12 intact, no focal abnormalities noted Psych:  Seems a bit depressed   EKG:  07/19/17 NSR nonspecific ST changes no acute ST elevation or depression   Telemetry:  Telemetry was personally reviewed and demonstrates:  NSR no arrhythmia 07/19/2017   Relevant CV Studies: Troponin negative x 2 Echo ordered   Laboratory Data:  Chemistry  Recent Labs Lab 07/19/17 0958  NA 138  K 3.2*  CL 104  CO2 23  GLUCOSE 101*  BUN 12  CREATININE 1.01*  CALCIUM 9.8  GFRNONAA 50*  GFRAA 58*  ANIONGAP 11    No results for input(s): PROT, ALBUMIN, AST, ALT, ALKPHOS, BILITOT in the last 168 hours. Hematology  Recent Labs Lab 07/19/17 0958  WBC 7.8  RBC 4.55  HGB 14.1  HCT 41.5  MCV 91.2  MCH 31.0  MCHC 34.0  RDW 13.3  PLT 175   Cardiac Enzymes  Recent Labs Lab 07/19/17 0958 07/19/17 1147  TROPONINI <0.03 <0.03   No results for input(s): TROPIPOC in the last  168 hours.  BNPNo results for input(s): BNP, PROBNP in the last 168 hours.  DDimer No results for input(s): DDIMER in the last 168 hours.  Radiology/Studies:  Dg Chest 2 View  Result Date: 07/19/2017 CLINICAL DATA:  Chest pain, shortness of breath, dizziness EXAM: CHEST  2 VIEW COMPARISON:  07/26/2013 FINDINGS: The lungs are  hyperinflated likely secondary to COPD. There is no focal parenchymal opacity. There is no pleural effusion or pneumothorax. The heart and mediastinal contours are unremarkable. The osseous structures are unremarkable. IMPRESSION: No active cardiopulmonary disease. Electronically Signed   By: Elige Ko   On: 07/19/2017 10:46    Assessment and Plan:   1. Chest Pain: mixed picture with some exertional component but prolonged symptoms with no ECG or Troponin change. Admit check echo Will need Lexiscan myouve If she remains pain free and r/o with normal EF by echo could consider doing myovue as outpatient early next week  2. HTN:  Well controlled.  Continue current medications and low sodium Dash type diet.   3. HLD: currently not on statin 4. Thyroid: on replacement TSH normal 5. GERD: continue prilosec  Further recommendations will be based on resolution of her pain r/o serial ECG and echo    For questions or updates, please contact [facility] Please consult [email or web address] for contact info under Cardiology/STEMI.   Signed, Charlton Haws, MD  07/19/2017 2:59 PM

## 2017-07-19 NOTE — ED Notes (Signed)
Lunch provided after speaking with cardiologist

## 2017-07-19 NOTE — ED Notes (Signed)
Cardiology at bedside.

## 2017-07-19 NOTE — ED Triage Notes (Signed)
Pt reports chest pain in center of chest, nonradiating with shortness of breath and dizziness.

## 2017-07-19 NOTE — ED Provider Notes (Signed)
[facility] Provider Note   CSN: [id number] Arrival date & time: 07/19/17  0946     History   Chief Complaint Chief Complaint  Patient presents with  . Chest Pain    HPI Robin Grant is a 83 y.o. female.  HPI  The pt is an 83 y/o female - has hx of HTN, hyperlipidemia, no hx of smoking or lung disease, She reports that last night she developed some chest pain, left-sided, radiation to the jaw and the shoulder, had some shortness of breath yesterday and this morning, denies fevers or coughing or swelling of the legs. Denies any other risk factors for pulmonary embolism and has not had any hemoptysis. She has never had any cardiac disease, has no cardiologist, she does remember a remote stress test but states it was negative. She has been walking and has been fairly active, she has had some shortness of breath with exertion recently. At this time her pain is mild. She is having a difficult time describing what it feels like. No medications given prior to arrival  Past Medical History:  Diagnosis Date  . Anxiety   . Chronic constipation   . HTN (hypertension)   . Hyperlipidemia   . Hypothyroidism   . Insomnia     Patient Active Problem List   Diagnosis Date Noted  . Rectal bleeding 07/26/2013  . Diverticulosis 07/26/2013  . Hypertension 07/26/2013  . Chronic kidney disease, stage III (moderate) (HCC) 07/26/2013  . Left sided abdominal pain 05/03/2011  . Laryngopharyngeal reflux (LPR) 05/03/2011  . Esophageal dysphagia 05/03/2011  . CONSTIPATION 02/15/2009  . HYPERLIPIDEMIA 02/14/2009  . HYPERTENSION 02/14/2009    Past Surgical History:  Procedure Laterality Date  . APPENDECTOMY    . COLONOSCOPY  2007   Dr. [name], pancolonic diverticula.  . COLONOSCOPY N/A 07/28/2013   Procedure: COLONOSCOPY;  Surgeon: West Bali, MD;  Location: [facility];  Service: Endoscopy;  Laterality: N/A;  PLEASE SCHEDULE AT 3 PM PER DR. FIELDS   . ESOPHAGOGASTRODUODENOSCOPY   05/21/2011   Dr. [name]: normal tubular esophagus, s/p empiric dilation with 38 F, innocent appearing AVM, small hiatal hernia  . GIVENS CAPSULE STUDY N/A 07/30/2013   Procedure: GIVENS CAPSULE STUDY;  Surgeon: [name], MD;  Location: [facility];  Service: Endoscopy;  Laterality: N/A;  . PORT-A-CATH REMOVAL  2008  . right ankle surgery  2007   osteomyelitis  . TUBAL LIGATION      OB History    No data available       Home Medications    Prior to Admission medications   Medication Sig Start Date End Date Taking? Authorizing Provider  levothyroxine (SYNTHROID, LEVOTHROID) 50 MCG tablet Take 50 mcg by mouth daily.      [provider]  losartan (COZAAR) 100 MG tablet Take 100 mg by mouth daily.      [provider]  Multiple Vitamin (MULTIVITAMIN) capsule Take 1 capsule by mouth daily.      [provider]  omeprazole (PRILOSEC) 20 MG capsule Take 20 mg by mouth 2 (two) times daily.     [provider]  rosuvastatin (CRESTOR) 10 MG tablet Take 10 mg by mouth daily.      [provider]  sertraline (ZOLOFT) 100 MG tablet Take 100 mg by mouth daily.    [provider]    Family History Family History  Problem Relation Age of Onset  . Breast cancer Mother  deceased from MI  . Stroke Father        MI too  . Colon cancer Maternal Aunt   . Rheumatic fever Sister 18       open heart surgery age 23, deceased age 65  . Lung cancer Sister   . Heart attack Sister   . Kidney disease Sister        born with one kidney, required transplant    Social History Social History  Substance Use Topics  . Smoking status: Former Games developer  . Smokeless tobacco: Not on file  . Alcohol use No     Allergies   Hydrocodone and Codeine   Review of Systems Review of Systems  All other systems reviewed and are negative.    Physical Exam Updated Vital Signs BP (!) 153/91 (BP Location: Right Arm)   Pulse 77   Temp (!) 97.5  F (36.4 C) (Oral)   Resp 16   Ht  (1.575 m)   Wt 56.7 kg (125 lb)   SpO2 96%   BMI 22.86 kg/m   Physical Exam  Constitutional: She appears well-developed and well-nourished. No distress.  HENT:  Head: Normocephalic and atraumatic.  Mouth/Throat: Oropharynx is clear and moist. No oropharyngeal exudate.  Eyes: Pupils are equal, round, and reactive to light. Conjunctivae and EOM are normal. Right eye exhibits no discharge. Left eye exhibits no discharge. No scleral icterus.  Neck: Normal range of motion. Neck supple. No JVD present. No thyromegaly present.  Cardiovascular: Normal rate, regular rhythm, normal heart sounds and intact distal pulses.  Exam reveals no gallop and no friction rub.   No murmur heard. Pulmonary/Chest: Effort normal and breath sounds normal. No respiratory distress. She has no wheezes. She has no rales.  Abdominal: Soft. Bowel sounds are normal. She exhibits no distension and no mass. There is no tenderness.  Musculoskeletal: Normal range of motion. She exhibits no edema or tenderness.  Lymphadenopathy:    She has no cervical adenopathy.  Neurological: She is alert. Coordination normal.  Skin: Skin is warm and dry. No rash noted. No erythema.  Psychiatric: She has a normal mood and affect. Her behavior is normal.  Nursing note and vitals reviewed.    ED Treatments / Results  Labs (all labs ordered are listed, but only abnormal results are displayed) Labs Reviewed  BASIC METABOLIC PANEL - Abnormal; Notable for the following:       Result Value   Potassium 3.2 (*)    Glucose, Bld 101 (*)    Creatinine, Ser 1.01 (*)    GFR calc non Af Amer 50 (*)    GFR calc Af Amer 58 (*)    All other components within normal limits  CBC  TROPONIN I    EKG  EKG Interpretation  Date/Time:  Friday July 19 2017 09:56:21 EDT Ventricular Rate:  72 PR Interval:    QRS Duration: 88 QT Interval:  414 QTC Calculation: 454 R Axis:   -63 Text Interpretation:   Sinus rhythm Inferior infarct, old since last tracing no significant change Confirmed by Eber Hong ([id number]) on 07/19/2017 10:19:58 AM       Radiology Dg Chest 2 View  Result Date: 07/19/2017 CLINICAL DATA:  Chest pain, shortness of breath, dizziness EXAM: CHEST  2 VIEW COMPARISON:  07/26/2013 FINDINGS: The lungs are hyperinflated likely secondary to COPD. There is no focal parenchymal opacity. There is no pleural effusion or pneumothorax. The heart and mediastinal contours are unremarkable. The osseous structures are  unremarkable. IMPRESSION: No active cardiopulmonary disease. Electronically Signed   By: Elige Ko   On: 07/19/2017 10:46    Procedures Procedures (including critical care time)  Medications Ordered in ED Medications  aspirin chewable tablet 324 mg (not administered)     Initial Impression / Assessment and Plan / ED Course  I have reviewed the triage vital signs and the nursing notes.  Pertinent labs & imaging results that were available during my care of the patient were reviewed by me and considered in my medical decision making (see chart for details).    The pt has no acute physical exam findings -  Her ECG is abnormal but not changed Has RF for cardiac disease and has some radiation to the jaw an dshould last night - likely admit for observation if Trop neg  Admitted to hospitalist Trop neg  Final Clinical Impressions(s) / ED Diagnoses   Final diagnoses:  Left sided chest pain    New Prescriptions New Prescriptions   No medications on file     Eber Hong, MD 07/19/17 1120

## 2017-07-19 NOTE — ED Notes (Signed)
Report to Lisa, RN

## 2017-07-20 ENCOUNTER — Observation Stay (HOSPITAL_BASED_OUTPATIENT_CLINIC_OR_DEPARTMENT_OTHER): Payer: Medicare HMO

## 2017-07-20 ENCOUNTER — Encounter (HOSPITAL_COMMUNITY): Payer: Self-pay

## 2017-07-20 DIAGNOSIS — E782 Mixed hyperlipidemia: Secondary | ICD-10-CM | POA: Diagnosis not present

## 2017-07-20 DIAGNOSIS — I361 Nonrheumatic tricuspid (valve) insufficiency: Secondary | ICD-10-CM | POA: Diagnosis not present

## 2017-07-20 DIAGNOSIS — I10 Essential (primary) hypertension: Secondary | ICD-10-CM | POA: Diagnosis not present

## 2017-07-20 DIAGNOSIS — R079 Chest pain, unspecified: Secondary | ICD-10-CM

## 2017-07-20 DIAGNOSIS — N183 Chronic kidney disease, stage 3 (moderate): Secondary | ICD-10-CM | POA: Diagnosis not present

## 2017-07-20 DIAGNOSIS — R0789 Other chest pain: Secondary | ICD-10-CM | POA: Diagnosis not present

## 2017-07-20 LAB — BASIC METABOLIC PANEL
ANION GAP: 9 (ref 5–15)
BUN: 19 mg/dL (ref 6–20)
CHLORIDE: 107 mmol/L (ref 101–111)
CO2: 21 mmol/L — ABNORMAL LOW (ref 22–32)
Calcium: 8.8 mg/dL — ABNORMAL LOW (ref 8.9–10.3)
Creatinine, Ser: 1.09 mg/dL — ABNORMAL HIGH (ref 0.44–1.00)
GFR calc non Af Amer: 45 mL/min — ABNORMAL LOW (ref >60)
GFR, EST AFRICAN AMERICAN: 52 mL/min — AB (ref >60)
Glucose, Bld: 106 mg/dL — ABNORMAL HIGH (ref 65–99)
POTASSIUM: 4.2 mmol/L (ref 3.5–5.1)
SODIUM: 137 mmol/L (ref 135–145)

## 2017-07-20 LAB — ECHOCARDIOGRAM COMPLETE
HEIGHTINCHES: 62 in
WEIGHTICAEL: 1985.6 [oz_av]

## 2017-07-20 LAB — HEMOGLOBIN A1C
Hgb A1c MFr Bld: 5.2 % (ref 4.8–5.6)
Mean Plasma Glucose: 102.54 mg/dL

## 2017-07-20 LAB — MAGNESIUM: MAGNESIUM: 1.9 mg/dL (ref 1.7–2.4)

## 2017-07-20 NOTE — Discharge Summary (Signed)
Physician Discharge Summary  JOYCELIN Grant ZOX:[id number] DOB: 1931/05/28 DOA: 07/19/2017  PCP: [name], MD  Admit date: 07/19/2017 Discharge date: 07/21/17  Admitted From: HOME Disposition:  Home   Recommendations for Outpatient Follow-up:  1. Follow up with PCP in 1-2 weeks 2. Please obtain BMP/CBC in one week    Discharge Condition: Stable CODE STATUS: stable Diet recommendation: Heart Healthy   Brief/Interim Summary: Robin Grant is a 83 y.o. female with medical history of essential hypertension, hypothyroidism, hyperlipidemia presenting with 6 month history of exertional chest discomfort that worsened on the evening on 07/18/2017.  The patient states that she had a dull substernal chest pain that radiated to her neck on the evening of 07/18/2017. The patient has some associated dizziness. She stated that it eventually subsided after a few hours. She stated that taking deep breath made worse. Her son came to check on her on the morning of 07/19/2017. When she notified him of her chest pain, the patient was brought to emergency department for further evaluation.  Discharge Diagnoses:  Chest pain -The patient has typical and atypical components -cardiology consult appreciated -Cycle troponins--neg x 4 -Echocardiogram --EF 60-65%, grade 1 DD, no WMA, mild TR -Check d-dimer--0.40 -will need outpatient Myoview study -chest pain free during the hospitalization  CKD stage 3 -baseline creatinine 1.0-1.3  Essential hypertension -Continue losartan  Hyperlipidemia -Continue Crestor  Hypokalemia -Repleted -Check magnesium--1.9  Hypothyroidism -Continue Synthroid  Discharge Instructions   Allergies as of 07/21/2017      Reactions   Hydrocodone Nausea And Vomiting   Codeine Nausea And Vomiting      Medication List    TAKE these medications   aspirin EC 81 MG tablet Take 1 tablet (81 mg total) by mouth daily.   levothyroxine 88 MCG tablet Commonly  known as:  SYNTHROID, LEVOTHROID Take 88 mcg by mouth daily.   LORazepam 0.5 MG tablet Commonly known as:  ATIVAN Take 0.5-1 tablets by mouth 2 (two) times daily as needed for anxiety or sleep.   losartan 100 MG tablet Commonly known as:  COZAAR Take 100 mg by mouth daily.   multivitamin capsule Take 1 capsule by mouth daily.   omeprazole 20 MG capsule Commonly known as:  PRILOSEC Take 20 mg by mouth 2 (two) times daily.   sertraline 100 MG tablet Commonly known as:  ZOLOFT Take 100 mg by mouth daily.      Follow-up Information    Wendall Stade, MD Follow up in 1 week(s).   Specialty:  Cardiology Contact information: [address] [place] [address] [phone]        Antoine Poche, MD Follow up in 1 week(s).   Specialty:  Cardiology Contact information: [address] [place] [address] [phone]          Allergies  Allergen Reactions  . Hydrocodone Nausea And Vomiting  . Codeine Nausea And Vomiting    Consultations:  cardiology   Procedures/Studies: Dg Chest 2 View  Result Date: 07/19/2017 CLINICAL DATA:  Chest pain, shortness of breath, dizziness EXAM: CHEST  2 VIEW COMPARISON:  07/26/2013 FINDINGS: The lungs are hyperinflated likely secondary to COPD. There is no focal parenchymal opacity. There is no pleural effusion or pneumothorax. The heart and mediastinal contours are unremarkable. The osseous structures are unremarkable. IMPRESSION: No active cardiopulmonary disease. Electronically Signed   By: Elige Ko   On: 07/19/2017 10:46   US Renal  Result Date: 06/28/2017 CLINICAL DATA:  Stage 3 chronic kidney disease. History of hypertension hyperlipidemia. EXAM: RENAL / URINARY TRACT ULTRASOUND COMPLETE COMPARISON:  CT abdomen pelvis - 05/03/2011 FINDINGS: Right Kidney: Normal cortical thickness and size, measuring 9.9 cm in length, however there is mild diffuse increased renal cortical echogenicity. Note is  made of an approximately 3.6 x 3.5 x 3.3 cm anechoic partially exophytic cyst arising from the inferior pole of the right kidney. Note is made of an additional punctate (approximately 0.8 cm) anechoic lesion arising from the superior of pole the right kidney which is too small to accurately characterize though favored to represent an additional renal cyst. No echogenic renal stones.  No urinary obstruction. Left Kidney: Normal cortical thickness and size, measuring 10.1 cm in length, however there is mild diffuse increased cortical echogenicity. Triangular shaped anechoic structure within the superior pole the left kidney (image 71) is favored to represent the subcortical calyx demonstrated on preceding abdominal CT secondary to asymmetric atrophy involving the superior pole the left kidney. No echogenic renal stones.  No urinary obstruction. Bladder: Suboptimally evaluated due to underdistention. IMPRESSION: 1. Increased bilateral renal cortical echogenicity compatible with provided history of chronic kidney disease. 2. No evidence of urinary obstruction. 3. Right-sided renal cyst. Electronically Signed   By: Simonne Come M.D.   On: 06/28/2017 13:58        Discharge Exam: Vitals:   07/20/17 2140 07/21/17 0428  BP: 135/78 (!) 144/88  Pulse: 75 71  Resp:  17  Temp: 97.6 F (36.4 C) 97.8 F (36.6 C)  SpO2: 98% 99%   Vitals:   07/20/17 0500 07/20/17 1529 07/20/17 2140 07/21/17 0428  BP: 137/78 120/64 135/78 (!) 144/88  Pulse: 76 71 75 71  Resp: Temp: 98.3 F (36.8 C) 98.1 F (36.7 C) 97.6 F (36.4 C) 97.8 F (36.6 C)  TempSrc: Oral Oral    SpO2: 96% 97% 98% 99%  Weight:      Height:        General: Pt is alert, awake, not in acute distress Cardiovascular: RRR, S1/S2 +, no rubs, no gallops Respiratory: CTA bilaterally, no wheezing, no rhonchi Abdominal: Soft, NT, ND, bowel sounds + Extremities: no edema, no cyanosis   The results of significant diagnostics from this  hospitalization (including imaging, microbiology, ancillary and laboratory) are listed below for reference.    Significant Diagnostic Studies: Dg Chest 2 View  Result Date: 07/19/2017 CLINICAL DATA:  Chest pain, shortness of breath, dizziness EXAM: CHEST  2 VIEW COMPARISON:  07/26/2013 FINDINGS: The lungs are hyperinflated likely secondary to COPD. There is no focal parenchymal opacity. There is no pleural effusion or pneumothorax. The heart and mediastinal contours are unremarkable. The osseous structures are unremarkable. IMPRESSION: No active cardiopulmonary disease. Electronically Signed   By: Elige Ko   On: 07/19/2017 10:46   US Renal  Result Date: 06/28/2017 CLINICAL DATA:  Stage 3 chronic kidney disease. History of hypertension hyperlipidemia. EXAM: RENAL / URINARY TRACT ULTRASOUND COMPLETE COMPARISON:  CT abdomen pelvis - 05/03/2011 FINDINGS: Right Kidney: Normal cortical thickness and size, measuring 9.9 cm in length, however there is mild diffuse increased renal cortical echogenicity. Note is made of an approximately 3.6 x 3.5 x 3.3 cm anechoic partially exophytic cyst arising from the inferior pole of the right kidney. Note is made of an additional punctate (approximately 0.8 cm) anechoic lesion arising from the superior of pole the right kidney which is too small to accurately characterize though favored to represent an additional  renal cyst. No echogenic renal stones.  No urinary obstruction. Left Kidney: Normal cortical thickness and size, measuring 10.1 cm in length, however there is mild diffuse increased cortical echogenicity. Triangular shaped anechoic structure within the superior pole the left kidney (image 71) is favored to represent the subcortical calyx demonstrated on preceding abdominal CT secondary to asymmetric atrophy involving the superior pole the left kidney. No echogenic renal stones.  No urinary obstruction. Bladder: Suboptimally evaluated due to underdistention.  IMPRESSION: 1. Increased bilateral renal cortical echogenicity compatible with provided history of chronic kidney disease. 2. No evidence of urinary obstruction. 3. Right-sided renal cyst. Electronically Signed   By: Simonne Come M.D.   On: 06/28/2017 13:58     Microbiology: No results found for this or any previous visit (from the past 240 hour(s)).   Labs: Basic Metabolic Panel:  Recent Labs Lab 07/19/17 0958 07/20/17 0734  NA 138 137  K 3.2* 4.2  CL 104 107  CO2 23 21*  GLUCOSE 101* 106*  BUN 12 19  CREATININE 1.01* 1.09*  CALCIUM 9.8 8.8*  MG  --  1.9   Liver Function Tests: No results for input(s): AST, ALT, ALKPHOS, BILITOT, PROT, ALBUMIN in the last 168 hours. No results for input(s): LIPASE, AMYLASE in the last 168 hours. No results for input(s): AMMONIA in the last 168 hours. CBC:  Recent Labs Lab 07/19/17 0958  WBC 7.8  HGB 14.1  HCT 41.5  MCV 91.2  PLT 175   Cardiac Enzymes:  Recent Labs Lab 07/19/17 0958 07/19/17 1147 07/19/17 1434 07/19/17 1744  TROPONINI <0.03 <0.03 <0.03 <0.03   BNP: Invalid input(s): POCBNP CBG: No results for input(s): GLUCAP in the last 168 hours.  Time coordinating discharge:  Greater than 30 minutes  Signed:  Tamirah George, DO [facility] Pager: [phone] 07/21/2017, 9:10 AM

## 2017-07-20 NOTE — Progress Notes (Signed)
*  PRELIMINARY RESULTS* Echocardiogram 2D Echocardiogram has been performed.  Jeryl Columbia 07/20/2017, 11:19 AM

## 2017-07-20 NOTE — Progress Notes (Signed)
PROGRESS NOTE  Robin Grant ZOX:[id number] DOB: 11/19/1930 DOA: 07/19/2017 PCP: [name], MD  Brief History:  Robin Toothman Allenis a 83 y.o.femalewith medical history of essential hypertension, hypothyroidism, hyperlipidemia presenting with 6 month history of exertional chest discomfort that worsened on the evening on 07/18/2017. The patient states that she had a dull substernal chest pain that radiated to her neck on the evening of 07/18/2017. The patient has some associated dizziness. She stated that it eventually subsided after a few hours. She stated that taking deep breath made worse. Her son came to check on her on the morning of 07/19/2017. When she notified him of her chest pain, the patient was brought to emergency department for further evaluation.  Assessment/Plan: Chest pain -The patient has typical and atypical components -cardiology consult appreciated -Cycle troponins--neg x 4 -Echocardiogram --results pending -Check d-dimer--0.40  CKD stage 3 -baseline creatinine 1.0-1.3  Essential hypertension -Continue losartan  Hyperlipidemia -Continue Crestor  Hypokalemia -Replete -Check magnesium--1.9  Hypothyroidism -Continue Synthroid  Disposition Plan:   Home 10/14 if echo is unremarkable Family Communication:  No Family at bedside  Consultants:  cardiology  Code Status:  FULL  DVT Prophylaxis:  [place] Lovenox   Procedures: As Listed in Progress Note Above  Antibiotics: None    Subjective: Patient denies fevers, chills, headache, chest pain, dyspnea, nausea, vomiting, diarrhea, abdominal pain, dysuria, hematuria, hematochezia, and melena.   Objective: Vitals:   07/19/17 1700 07/19/17 1925 07/20/17 0500 07/20/17 1529  BP: 127/71 (!) 106/59 137/78 120/64  Pulse: 85 80 76 71  Resp: (!) Temp:  98.6 F (37 C) 98.3 F (36.8 C) 98.1 F (36.7 C)  TempSrc:  Oral Oral Oral  SpO2: 97% 97% 96% 97%  Weight:  56.3 kg (124 lb 1.6  oz)    Height:   (1.575 m)      Intake/Output Summary (Last 24 hours) at 07/20/17 1815 Last data filed at 07/20/17 1236  Gross per 24 hour  Intake              480 ml  Output                0 ml  Net              480 ml   Weight change:  Exam:   General:  Pt is alert, follows commands appropriately, not in acute distress  HEENT: No icterus, No thrush, No neck mass, [place]/AT  Cardiovascular: RRR, S1/S2, no rubs, no gallops  Respiratory: CTA bilaterally, no wheezing, no crackles, no rhonchi  Abdomen: Soft/+BS, non tender, non distended, no guarding  Extremities: No edema, No lymphangitis, No petechiae, No rashes, no synovitis   Data Reviewed: I have personally reviewed following labs and imaging studies Basic Metabolic Panel:  Recent Labs Lab 07/19/17 0958 07/20/17 0734  NA 138 137  K 3.2* 4.2  CL 104 107  CO2 23 21*  GLUCOSE 101* 106*  BUN 12 19  CREATININE 1.01* 1.09*  CALCIUM 9.8 8.8*  MG  --  1.9   Liver Function Tests: No results for input(s): AST, ALT, ALKPHOS, BILITOT, PROT, ALBUMIN in the last 168 hours. No results for input(s): LIPASE, AMYLASE in the last 168 hours. No results for input(s): AMMONIA in the last 168 hours. Coagulation Profile: No results for input(s): INR, PROTIME in the last 168 hours. CBC:  Recent Labs Lab 07/19/17 0958  WBC 7.8  HGB 14.1  HCT 41.5  MCV 91.2  PLT 175   Cardiac Enzymes:  Recent Labs Lab 07/19/17 0958 07/19/17 1147 07/19/17 1434 07/19/17 1744  TROPONINI <0.03 <0.03 <0.03 <0.03   BNP: Invalid input(s): POCBNP CBG: No results for input(s): GLUCAP in the last 168 hours. HbA1C:  Recent Labs  07/19/17 1744  HGBA1C 5.2   Urine analysis: No results found for: COLORURINE, APPEARANCEUR, LABSPEC, PHURINE, GLUCOSEU, HGBUR, BILIRUBINUR, KETONESUR, PROTEINUR, UROBILINOGEN, NITRITE, LEUKOCYTESUR Sepsis Labs: (procalcitonin:4,lacticidven:4) )No results found for this or any previous visit (from  the past 240 hour(s)).   Scheduled Meds: . aspirin  325 mg Oral Daily  . enoxaparin (LOVENOX) injection  40 mg Subcutaneous Q24H  . levothyroxine  88 mcg Oral QAC breakfast  . losartan  100 mg Oral Daily  . multivitamin with minerals  1 tablet Oral Daily  . pantoprazole  40 mg Oral Daily  . sertraline  100 mg Oral Daily   Continuous Infusions:  Procedures/Studies: Dg Chest 2 View  Result Date: 07/19/2017 CLINICAL DATA:  Chest pain, shortness of breath, dizziness EXAM: CHEST  2 VIEW COMPARISON:  07/26/2013 FINDINGS: The lungs are hyperinflated likely secondary to COPD. There is no focal parenchymal opacity. There is no pleural effusion or pneumothorax. The heart and mediastinal contours are unremarkable. The osseous structures are unremarkable. IMPRESSION: No active cardiopulmonary disease. Electronically Signed   By: Elige Ko   On: 07/19/2017 10:46   US Renal  Result Date: 06/28/2017 CLINICAL DATA:  Stage 3 chronic kidney disease. History of hypertension hyperlipidemia. EXAM: RENAL / URINARY TRACT ULTRASOUND COMPLETE COMPARISON:  CT abdomen pelvis - 05/03/2011 FINDINGS: Right Kidney: Normal cortical thickness and size, measuring 9.9 cm in length, however there is mild diffuse increased renal cortical echogenicity. Note is made of an approximately 3.6 x 3.5 x 3.3 cm anechoic partially exophytic cyst arising from the inferior pole of the right kidney. Note is made of an additional punctate (approximately 0.8 cm) anechoic lesion arising from the superior of pole the right kidney which is too small to accurately characterize though favored to represent an additional renal cyst. No echogenic renal stones.  No urinary obstruction. Left Kidney: Normal cortical thickness and size, measuring 10.1 cm in length, however there is mild diffuse increased cortical echogenicity. Triangular shaped anechoic structure within the superior pole the left kidney (image 71) is favored to represent the subcortical  calyx demonstrated on preceding abdominal CT secondary to asymmetric atrophy involving the superior pole the left kidney. No echogenic renal stones.  No urinary obstruction. Bladder: Suboptimally evaluated due to underdistention. IMPRESSION: 1. Increased bilateral renal cortical echogenicity compatible with provided history of chronic kidney disease. 2. No evidence of urinary obstruction. 3. Right-sided renal cyst. Electronically Signed   By: Simonne Come M.D.   On: 06/28/2017 13:58    Devesh Monforte, DO  [facility] Pager [phone]  If 7PM-7AM, please contact night-coverage [email or web address] Password [email or web address] 07/20/2017, 6:15 PM   LOS: 0 days

## 2017-07-21 ENCOUNTER — Encounter (HOSPITAL_COMMUNITY): Payer: Self-pay

## 2017-07-21 DIAGNOSIS — N183 Chronic kidney disease, stage 3 (moderate): Secondary | ICD-10-CM | POA: Diagnosis not present

## 2017-07-21 DIAGNOSIS — R0789 Other chest pain: Secondary | ICD-10-CM | POA: Diagnosis not present

## 2017-07-21 DIAGNOSIS — E782 Mixed hyperlipidemia: Secondary | ICD-10-CM | POA: Diagnosis not present

## 2017-07-21 DIAGNOSIS — I10 Essential (primary) hypertension: Secondary | ICD-10-CM | POA: Diagnosis not present

## 2017-07-21 DIAGNOSIS — R079 Chest pain, unspecified: Secondary | ICD-10-CM | POA: Diagnosis not present

## 2017-07-21 MED ORDER — ASPIRIN EC 81 MG PO TBEC: 81.0000 mg | DELAYED_RELEASE_TABLET | Freq: Every day | ORAL | 2 refills | Status: AC

## 2017-07-21 NOTE — Progress Notes (Signed)
Discharge instructions read to patient and her family.  All voiced understanding of instructions Discharged to home with family.

## 2017-08-12 ENCOUNTER — Encounter (INDEPENDENT_AMBULATORY_CARE_PROVIDER_SITE_OTHER): Payer: Medicare HMO | Admitting: Ophthalmology

## 2017-08-12 DIAGNOSIS — H34812 Central retinal vein occlusion, left eye, with macular edema: Secondary | ICD-10-CM | POA: Diagnosis not present

## 2017-08-12 DIAGNOSIS — H35033 Hypertensive retinopathy, bilateral: Secondary | ICD-10-CM

## 2017-08-12 DIAGNOSIS — H43813 Vitreous degeneration, bilateral: Secondary | ICD-10-CM

## 2017-08-12 DIAGNOSIS — I10 Essential (primary) hypertension: Secondary | ICD-10-CM

## 2017-09-09 ENCOUNTER — Encounter (INDEPENDENT_AMBULATORY_CARE_PROVIDER_SITE_OTHER): Payer: Medicare HMO | Admitting: Ophthalmology

## 2017-09-09 DIAGNOSIS — H34812 Central retinal vein occlusion, left eye, with macular edema: Secondary | ICD-10-CM | POA: Diagnosis not present

## 2017-09-09 DIAGNOSIS — H35033 Hypertensive retinopathy, bilateral: Secondary | ICD-10-CM | POA: Diagnosis not present

## 2017-09-09 DIAGNOSIS — H43813 Vitreous degeneration, bilateral: Secondary | ICD-10-CM

## 2017-09-09 DIAGNOSIS — I10 Essential (primary) hypertension: Secondary | ICD-10-CM | POA: Diagnosis not present

## 2017-10-16 ENCOUNTER — Encounter (INDEPENDENT_AMBULATORY_CARE_PROVIDER_SITE_OTHER): Payer: Medicare HMO | Admitting: Ophthalmology

## 2017-10-29 ENCOUNTER — Encounter (INDEPENDENT_AMBULATORY_CARE_PROVIDER_SITE_OTHER): Payer: Medicare HMO | Admitting: Ophthalmology

## 2017-12-02 ENCOUNTER — Other Ambulatory Visit: Payer: Self-pay

## 2017-12-02 ENCOUNTER — Emergency Department (HOSPITAL_COMMUNITY): Payer: Medicare HMO

## 2017-12-02 ENCOUNTER — Encounter (HOSPITAL_COMMUNITY): Payer: Self-pay | Admitting: Emergency Medicine

## 2017-12-02 ENCOUNTER — Inpatient Hospital Stay (HOSPITAL_COMMUNITY)
Admission: EM | Admit: 2017-12-02 | Discharge: 2017-12-05 | DRG: 683 | Disposition: A | Discharge status: Home Health | Payer: Medicare HMO | Attending: Family Medicine | Admitting: Family Medicine

## 2017-12-02 DIAGNOSIS — K5909 Other constipation: Secondary | ICD-10-CM | POA: Diagnosis present

## 2017-12-02 DIAGNOSIS — Z79899 Other long term (current) drug therapy: Secondary | ICD-10-CM

## 2017-12-02 DIAGNOSIS — E860 Dehydration: Secondary | ICD-10-CM | POA: Diagnosis not present

## 2017-12-02 DIAGNOSIS — S0083XA Contusion of other part of head, initial encounter: Secondary | ICD-10-CM | POA: Diagnosis not present

## 2017-12-02 DIAGNOSIS — R402143 Coma scale, eyes open, spontaneous, at hospital admission: Secondary | ICD-10-CM | POA: Diagnosis present

## 2017-12-02 DIAGNOSIS — Y92002 Bathroom of unspecified non-institutional (private) residence single-family (private) house as the place of occurrence of the external cause: Secondary | ICD-10-CM

## 2017-12-02 DIAGNOSIS — W1830XA Fall on same level, unspecified, initial encounter: Secondary | ICD-10-CM | POA: Diagnosis present

## 2017-12-02 DIAGNOSIS — Z7982 Long term (current) use of aspirin: Secondary | ICD-10-CM

## 2017-12-02 DIAGNOSIS — R339 Retention of urine, unspecified: Secondary | ICD-10-CM | POA: Diagnosis not present

## 2017-12-02 DIAGNOSIS — E872 Acidosis, unspecified: Secondary | ICD-10-CM | POA: Diagnosis present

## 2017-12-02 DIAGNOSIS — F411 Generalized anxiety disorder: Secondary | ICD-10-CM | POA: Diagnosis not present

## 2017-12-02 DIAGNOSIS — I10 Essential (primary) hypertension: Secondary | ICD-10-CM | POA: Diagnosis not present

## 2017-12-02 DIAGNOSIS — R402363 Coma scale, best motor response, obeys commands, at hospital admission: Secondary | ICD-10-CM | POA: Diagnosis present

## 2017-12-02 DIAGNOSIS — N179 Acute kidney failure, unspecified: Principal | ICD-10-CM | POA: Diagnosis present

## 2017-12-02 DIAGNOSIS — E785 Hyperlipidemia, unspecified: Secondary | ICD-10-CM | POA: Diagnosis present

## 2017-12-02 DIAGNOSIS — Z79890 Hormone replacement therapy (postmenopausal): Secondary | ICD-10-CM

## 2017-12-02 DIAGNOSIS — R402243 Coma scale, best verbal response, confused conversation, at hospital admission: Secondary | ICD-10-CM | POA: Diagnosis present

## 2017-12-02 DIAGNOSIS — R531 Weakness: Secondary | ICD-10-CM

## 2017-12-02 DIAGNOSIS — N183 Chronic kidney disease, stage 3 unspecified: Secondary | ICD-10-CM | POA: Diagnosis present

## 2017-12-02 DIAGNOSIS — S090XXA Injury of blood vessels of head, not elsewhere classified, initial encounter: Secondary | ICD-10-CM | POA: Diagnosis not present

## 2017-12-02 DIAGNOSIS — Z885 Allergy status to narcotic agent status: Secondary | ICD-10-CM

## 2017-12-02 DIAGNOSIS — E039 Hypothyroidism, unspecified: Secondary | ICD-10-CM | POA: Diagnosis present

## 2017-12-02 DIAGNOSIS — G4700 Insomnia, unspecified: Secondary | ICD-10-CM | POA: Diagnosis present

## 2017-12-02 DIAGNOSIS — R0602 Shortness of breath: Secondary | ICD-10-CM | POA: Diagnosis not present

## 2017-12-02 DIAGNOSIS — S0990XA Unspecified injury of head, initial encounter: Secondary | ICD-10-CM | POA: Diagnosis not present

## 2017-12-02 DIAGNOSIS — Z87891 Personal history of nicotine dependence: Secondary | ICD-10-CM | POA: Diagnosis not present

## 2017-12-02 DIAGNOSIS — I129 Hypertensive chronic kidney disease with stage 1 through stage 4 chronic kidney disease, or unspecified chronic kidney disease: Secondary | ICD-10-CM | POA: Diagnosis present

## 2017-12-02 DIAGNOSIS — K219 Gastro-esophageal reflux disease without esophagitis: Secondary | ICD-10-CM | POA: Diagnosis not present

## 2017-12-02 DIAGNOSIS — Z6821 Body mass index (BMI) 21.0-21.9, adult: Secondary | ICD-10-CM | POA: Diagnosis not present

## 2017-12-02 DIAGNOSIS — S199XXA Unspecified injury of neck, initial encounter: Secondary | ICD-10-CM | POA: Diagnosis not present

## 2017-12-02 DIAGNOSIS — R05 Cough: Secondary | ICD-10-CM | POA: Diagnosis not present

## 2017-12-02 LAB — CBC WITH DIFFERENTIAL/PLATELET
BASOS ABS: 0 10*3/uL (ref 0.0–0.1)
BASOS PCT: 0 %
EOS ABS: 0.1 10*3/uL (ref 0.0–0.7)
EOS PCT: 2 %
HCT: 36.4 % (ref 36.0–46.0)
HEMOGLOBIN: 12 g/dL (ref 12.0–15.0)
Lymphocytes Relative: 20 %
Lymphs Abs: 1.2 10*3/uL (ref 0.7–4.0)
MCH: 30 pg (ref 26.0–34.0)
MCHC: 33 g/dL (ref 30.0–36.0)
MCV: 91 fL (ref 78.0–100.0)
Monocytes Absolute: 0.8 10*3/uL (ref 0.1–1.0)
Monocytes Relative: 14 %
NEUTROS PCT: 64 %
Neutro Abs: 4.1 10*3/uL (ref 1.7–7.7)
PLATELETS: 237 10*3/uL (ref 150–400)
RBC: 4 MIL/uL (ref 3.87–5.11)
RDW: 13.8 % (ref 11.5–15.5)
WBC: 6.2 10*3/uL (ref 4.0–10.5)

## 2017-12-02 LAB — COMPREHENSIVE METABOLIC PANEL
ALBUMIN: 3.5 g/dL (ref 3.5–5.0)
ALT: 18 U/L (ref 14–54)
AST: 26 U/L (ref 15–41)
Alkaline Phosphatase: 80 U/L (ref 38–126)
Anion gap: 13 (ref 5–15)
BUN: 58 mg/dL — ABNORMAL HIGH (ref 6–20)
CHLORIDE: 109 mmol/L (ref 101–111)
CO2: 15 mmol/L — AB (ref 22–32)
CREATININE: 2.07 mg/dL — AB (ref 0.44–1.00)
Calcium: 10.2 mg/dL (ref 8.9–10.3)
GFR calc non Af Amer: 21 mL/min — ABNORMAL LOW (ref >60)
GFR, EST AFRICAN AMERICAN: 24 mL/min — AB (ref >60)
GLUCOSE: 93 mg/dL (ref 65–99)
Potassium: 4.5 mmol/L (ref 3.5–5.1)
SODIUM: 137 mmol/L (ref 135–145)
Total Bilirubin: 0.9 mg/dL (ref 0.3–1.2)
Total Protein: 7.1 g/dL (ref 6.5–8.1)

## 2017-12-02 LAB — INFLUENZA PANEL BY PCR (TYPE A & B)
INFLBPCR: NEGATIVE
Influenza A By PCR: NEGATIVE

## 2017-12-02 LAB — LIPASE, BLOOD: LIPASE: 40 U/L (ref 11–51)

## 2017-12-02 LAB — LACTIC ACID, PLASMA: Lactic Acid, Venous: 1.6 mmol/L (ref 0.5–1.9)

## 2017-12-02 MED ORDER — PANTOPRAZOLE SODIUM 40 MG PO TBEC
40.0000 mg | DELAYED_RELEASE_TABLET | Freq: Every day | ORAL | Status: DC
  Administered 2017-12-03 – 2017-12-05 (×3): 40 mg via ORAL
  Filled 2017-12-02 (×3): qty 1

## 2017-12-02 MED ORDER — THIAMINE HCL 100 MG/ML IJ SOLN
100.0000 mg | Freq: Once | INTRAMUSCULAR | Status: AC
  Administered 2017-12-02: 100 mg via INTRAVENOUS
  Filled 2017-12-02: qty 2

## 2017-12-02 MED ORDER — LEVOTHYROXINE SODIUM 88 MCG PO TABS
88.0000 ug | ORAL_TABLET | Freq: Every day | ORAL | Status: DC
  Administered 2017-12-03 – 2017-12-05 (×3): 88 ug via ORAL
  Filled 2017-12-02 (×4): qty 1

## 2017-12-02 MED ORDER — LORAZEPAM 0.5 MG PO TABS
0.2500 mg | ORAL_TABLET | Freq: Two times a day (BID) | ORAL | Status: DC | PRN
  Administered 2017-12-03 (×2): 0.5 mg via ORAL
  Filled 2017-12-02 (×2): qty 1

## 2017-12-02 MED ORDER — LORAZEPAM 0.5 MG PO TABS
0.2500 mg | ORAL_TABLET | Freq: Once | ORAL | Status: AC
  Administered 2017-12-02: 0.25 mg via ORAL
  Filled 2017-12-02: qty 1

## 2017-12-02 MED ORDER — HYDRALAZINE HCL 20 MG/ML IJ SOLN: 5.0000 mg | Freq: Four times a day (QID) | INTRAMUSCULAR | Status: DC | PRN

## 2017-12-02 MED ORDER — SODIUM CHLORIDE 0.9 % IV BOLUS (SEPSIS)
500.0000 mL | Freq: Once | INTRAVENOUS | Status: AC
  Administered 2017-12-02: 500 mL via INTRAVENOUS

## 2017-12-02 MED ORDER — SERTRALINE HCL 50 MG PO TABS
100.0000 mg | ORAL_TABLET | Freq: Every day | ORAL | Status: DC
  Administered 2017-12-03 – 2017-12-05 (×3): 100 mg via ORAL
  Filled 2017-12-02 (×3): qty 2

## 2017-12-02 MED ORDER — ADULT MULTIVITAMIN W/MINERALS CH
1.0000 | ORAL_TABLET | Freq: Every day | ORAL | Status: DC
  Administered 2017-12-03 – 2017-12-05 (×3): 1 via ORAL
  Filled 2017-12-02 (×3): qty 1

## 2017-12-02 MED ORDER — ENOXAPARIN SODIUM 30 MG/0.3ML ~~LOC~~ SOLN
30.0000 mg | SUBCUTANEOUS | Status: DC
  Administered 2017-12-02 – 2017-12-04 (×3): 30 mg via SUBCUTANEOUS
  Filled 2017-12-02 (×3): qty 0.3

## 2017-12-02 MED ORDER — SODIUM CHLORIDE 0.9 % IV SOLN
INTRAVENOUS | Status: DC
  Administered 2017-12-02: via INTRAVENOUS

## 2017-12-02 MED ORDER — ASPIRIN EC 81 MG PO TBEC
81.0000 mg | DELAYED_RELEASE_TABLET | Freq: Every day | ORAL | Status: DC
  Administered 2017-12-03 – 2017-12-05 (×3): 81 mg via ORAL
  Filled 2017-12-02 (×3): qty 1

## 2017-12-02 NOTE — H&P (Signed)
[facility] H&P   Patient Demographics:    Robin Grant, is a 84 y.o. female  MRN: [id number]   DOB - Sep 05, 1931  Admit Date - 12/02/2017  Outpatient Primary MD for the patient is [name], MD  Referring MD/NP/PA: Burgess Amor   Outpatient Specialists:   Patient coming from: home/ [place] office  Chief Complaint  Patient presents with  . Weakness      HPI:    Robin Grant  is a 84 y.o. female  w hypertension, hyperlipidemia, hypothyroidsm, presents with feeling unwell for the past 1 week,  tx with doxycycline and had poor po intake. Pt felt weak and general malaise and therefore presented to Dr. Karleen [place] office and he sent her to ED for dehydration.    In ED,  CXR +> no active cardiopulmonary disease  Wbc 6.2, Hgb 12.0, Plt 237  Na 137, K 4.5 Hco3 15 BUn 58, Creatinine 2.07  Ast 26, Alt 18  Influenza negative  Pt will be admitted for ARF.        Review of systems:    In addition to the HPI above,  No Fever-chills, No Headache, No changes with Vision or hearing, No problems swallowing food or Liquids, No Chest pain, Cough or Shortness of Breath, No Abdominal pain, No Nausea or Vommitting, Bowel movements are regular, No Blood in stool or Urine, No dysuria, No new skin rashes or bruises, No new joints pains-aches,  No new weakness, tingling, numbness in any extremity, No recent weight gain or loss, No polyuria, polydypsia or polyphagia, No significant Mental Stressors.  A full 10 point Review of Systems was done, except as stated above, all other Review of Systems were negative.   With Past History of the following :    Past Medical History:  Diagnosis Date  . Anxiety   . Chronic constipation   . HTN (hypertension)   . Hyperlipidemia   . Hypothyroidism   . Insomnia       Past Surgical History:  Procedure Laterality Date  . APPENDECTOMY    .  COLONOSCOPY  2007   Dr. [name], pancolonic diverticula.  . COLONOSCOPY N/A 07/28/2013   Procedure: COLONOSCOPY;  Surgeon: West Bali, MD;  Location: [facility];  Service: Endoscopy;  Laterality: N/A;  PLEASE SCHEDULE AT 3 PM PER DR. FIELDS   . ESOPHAGOGASTRODUODENOSCOPY  05/21/2011   Dr. [name]: normal tubular esophagus, s/p empiric dilation with 60 F, innocent appearing AVM, small hiatal hernia  . GIVENS CAPSULE STUDY N/A 07/30/2013   Procedure: GIVENS CAPSULE STUDY;  Surgeon: [name], MD;  Location: [facility];  Service: Endoscopy;  Laterality: N/A;  . PORT-A-CATH REMOVAL  2008  . right ankle surgery  2007   osteomyelitis  . TUBAL LIGATION        Social History:     Social History   Tobacco Use  .  Smoking status: Former Games developer  . Smokeless tobacco: Never Used  Substance Use Topics  . Alcohol use: No     Lives - at home  Mobility - walks by self   Family History :     Family History  Problem Relation Age of Onset  . Breast cancer Mother        deceased from MI  . Stroke Father        MI too  . Colon cancer Maternal Aunt   . Rheumatic fever Sister 80       open heart surgery age 24, deceased age 81  . Lung cancer Sister   . Heart attack Sister   . Kidney disease Sister        born with one kidney, required transplant      Home Medications:   Prior to Admission medications   Medication Sig Start Date End Date Taking? Authorizing Provider  aspirin EC 81 MG tablet Take 1 tablet (81 mg total) by mouth daily. 07/21/17 07/21/18  Catarina Hartshorn, MD  doxycycline (VIBRAMYCIN) 100 MG capsule Take 100 mg by mouth 2 (two) times daily. 11/25/17   [provider]  levothyroxine (SYNTHROID, LEVOTHROID) 88 MCG tablet Take 88 mcg by mouth daily. 05/28/17   [provider]  LORazepam (ATIVAN) 0.5 MG tablet Take 0.5-1 tablets by mouth 2 (two) times daily as needed for anxiety or sleep.  04/18/17   [provider]  losartan (COZAAR) 100 MG  tablet Take 100 mg by mouth daily.      [provider]  Multiple Vitamin (MULTIVITAMIN) capsule Take 1 capsule by mouth daily.      [provider]  omeprazole (PRILOSEC) 20 MG capsule Take 20 mg by mouth 2 (two) times daily.     [provider]  sertraline (ZOLOFT) 100 MG tablet Take 100 mg by mouth daily.    [provider]     Allergies:     Allergies  Allergen Reactions  . Hydrocodone Nausea And Vomiting  . Codeine Nausea And Vomiting     Physical Exam:   Vitals  Blood pressure (!) 176/93, pulse 73, temperature 97.6 F (36.4 C), temperature source Temporal, resp. rate (!) 27, height 5\' 5"  (1.651 m), weight 51.7 kg (114 lb), SpO2 100 %.   1. General  lying in bed in NAD,   2. Normal affect and insight, Not Suicidal or Homicidal, Awake Alert, Oriented X 3.  3. No F.N deficits, ALL C.Nerves Intact, Strength 5/5 all 4 extremities, Sensation intact all 4 extremities, Plantars down going.  4. Ears and Eyes appear Normal, Conjunctivae clear, PERRLA. Moist Oral Mucosa.  5. Supple Neck, No JVD, No cervical lymphadenopathy appriciated, No Carotid Bruits.  6. Symmetrical Chest wall movement, Good air movement bilaterally, CTAB.  7. RRR, No Gallops, Rubs or Murmurs, No Parasternal Heave.  8. Positive Bowel Sounds, Abdomen Soft, No tenderness, No organomegaly appriciated,No rebound -guarding or rigidity.  9.  No Cyanosis, Normal Skin Turgor, No Skin Rash or Bruise.  10. Good muscle tone,  joints appear normal , no effusions, Normal ROM.  11. No Palpable Lymph Nodes in Neck or Axillae    Data Review:    CBC Recent Labs  Lab 12/02/17 1633  WBC 6.2  HGB 12.0  HCT 36.4  PLT 237  MCV 91.0  MCH 30.0  MCHC 33.0  RDW 13.8  LYMPHSABS 1.2  MONOABS 0.8  EOSABS 0.1  BASOSABS 0.0   ------------------------------------------------------------------------------------------------------------------  Chemistries  Recent  Labs  Lab  12/02/17 1633  NA 137  K 4.5  CL 109  CO2 15*  GLUCOSE 93  BUN 58*  CREATININE 2.07*  CALCIUM 10.2  AST 26  ALT 18  ALKPHOS 80  BILITOT 0.9   ------------------------------------------------------------------------------------------------------------------ estimated creatinine clearance is 16.5 mL/min (A) (by C-G formula based on SCr of 2.07 mg/dL (H)). ------------------------------------------------------------------------------------------------------------------ No results for input(s): TSH, T4TOTAL, T3FREE, THYROIDAB in the last 72 hours.  Invalid input(s): FREET3  Coagulation profile No results for input(s): INR, PROTIME in the last 168 hours. ------------------------------------------------------------------------------------------------------------------- No results for input(s): DDIMER in the last 72 hours. -------------------------------------------------------------------------------------------------------------------  Cardiac Enzymes No results for input(s): CKMB, TROPONINI, MYOGLOBIN in the last 168 hours.  Invalid input(s): CK ------------------------------------------------------------------------------------------------------------------ No results found for: BNP   ---------------------------------------------------------------------------------------------------------------  Urinalysis No results found for: COLORURINE, APPEARANCEUR, LABSPEC, PHURINE, GLUCOSEU, HGBUR, BILIRUBINUR, KETONESUR, PROTEINUR, UROBILINOGEN, NITRITE, LEUKOCYTESUR  ----------------------------------------------------------------------------------------------------------------   Imaging Results:    Dg Chest 2 View  Result Date: 12/02/2017 CLINICAL DATA:  Shortness of breath. EXAM: CHEST  2 VIEW COMPARISON:  Radiograph of July 19, 2017. FINDINGS: The heart size and mediastinal contours are within normal limits. Both lungs are clear. Atherosclerosis of thoracic aorta is noted. No  pneumothorax or pleural effusion is noted. The visualized skeletal structures are unremarkable. IMPRESSION: No active cardiopulmonary disease. Aortic Atherosclerosis (ICD10-I70.0). Electronically Signed   By: Lupita Raider, M.D.   On: 12/02/2017 17:54   Ct Head Wo Contrast  Result Date: 12/02/2017 CLINICAL DATA:  Status post fall 2 days ago with hematoma of left face. EXAM: CT HEAD WITHOUT CONTRAST CT CERVICAL SPINE WITHOUT CONTRAST TECHNIQUE: Multidetector CT imaging of the head and cervical spine was performed following the standard protocol without intravenous contrast. Multiplanar CT image reconstructions of the cervical spine were also generated. COMPARISON:  None. FINDINGS: CT HEAD FINDINGS Brain: No evidence of acute infarction, hemorrhage, hydrocephalus, extra-axial collection or mass lesion/mass effect. There is chronic diffuse atrophy. Chronic bilateral periventricular white matter small vessel ischemic changes identified. 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 Vascular: No hyperdense vessel or unexpected calcification. Skull: Normal. Negative for fracture or focal lesion. Sinuses/Orbits: Minimal periosteal thickening of the left maxillary sinus is identified. The orbits are normal. Other: None. CT CERVICAL SPINE FINDINGS Alignment: Normal. Skull base and vertebrae: No acute fracture. No primary bone lesion or focal pathologic process. Soft tissues and spinal canal: No prevertebral fluid or swelling. No visible canal hematoma. Disc levels: Mild degenerative joint changes are identified in the upper and mid cervical spine with narrowed joint space and osteophyte formation. Upper chest: Negative. Other: Low-density nodule is identified in the left thyroid gland. IMPRESSION: No focal acute intracranial abnormality identified. Chronic diffuse atrophy. Chronic bilateral periventricular white matter small vessel ischemic change. No acute fracture or dislocation of cervical spine. Degenerative  joint changes of cervical spine. Electronically Signed   By: Sherian Rein M.D.   On: 12/02/2017 18:07   Ct Cervical Spine Wo Contrast  Result Date: 12/02/2017 CLINICAL DATA:  Status post fall 2 days ago with hematoma of left face. EXAM: CT HEAD WITHOUT CONTRAST CT CERVICAL SPINE WITHOUT CONTRAST TECHNIQUE: Multidetector CT imaging of the head and cervical spine was performed following the standard protocol without intravenous contrast. Multiplanar CT image reconstructions of the cervical spine were also generated. COMPARISON:  None. FINDINGS: CT HEAD FINDINGS Brain: No evidence of acute infarction, hemorrhage, hydrocephalus, extra-axial collection or mass lesion/mass effect. There is chronic diffuse atrophy. Chronic bilateral periventricular white matter small  vessel ischemic changes identified. 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 Vascular: No hyperdense vessel or unexpected calcification. Skull: Normal. Negative for fracture or focal lesion. Sinuses/Orbits: Minimal periosteal thickening of the left maxillary sinus is identified. The orbits are normal. Other: None. CT CERVICAL SPINE FINDINGS Alignment: Normal. Skull base and vertebrae: No acute fracture. No primary bone lesion or focal pathologic process. Soft tissues and spinal canal: No prevertebral fluid or swelling. No visible canal hematoma. Disc levels: Mild degenerative joint changes are identified in the upper and mid cervical spine with narrowed joint space and osteophyte formation. Upper chest: Negative. Other: Low-density nodule is identified in the left thyroid gland. IMPRESSION: No focal acute intracranial abnormality identified. Chronic diffuse atrophy. Chronic bilateral periventricular white matter small vessel ischemic change. No acute fracture or dislocation of cervical spine. Degenerative joint changes of cervical spine. Electronically Signed   By: Sherian ReinWei-Chen  Lin M.D.   On: 12/02/2017 18:07     Assessment & Plan:    Principal  Problem:   ARF (acute renal failure) (HCC) Active Problems:   Benign hypertension   ARF STOP LOSARTAN Check urine sodium, urine creatinine, urine eosinophils Check renal ultrasound Hydrate with ns iv Check cmp in am  Hypertension STOP Losartan Hydralazine 5mg  iv q6h prn  NonAG acidosis Probably due to diarrhea the prior week Hydrate with ns iv   Gerd Cont PPI  Anxiety Cont Zoloft 100mg  po qday Cont lorazepam prn  Hypothyroidsm Cont levothyroxine        DVT Prophylaxis Lovenox- SCDs  AM Labs Ordered, also please review Full Orders  Family Communication: Admission, patients condition and plan of care including tests being ordered have been discussed with the patient who indicate understanding and agree with the plan and Code Status.  Code Status FULL CODE  Likely DC to  home  Condition GUARDED    Consults called: none  Admission status: inpatient    Time spent in minutes : 45    Pearson GrippeJames Deanthony Maull M.D on 12/02/2017 at 8:46 PM  Between 7am to 7pm - Pager - [phone]  After 7pm go to [email or web address] - password [email or web address]  [facility] - Office  [phone]

## 2017-12-02 NOTE — ED Notes (Signed)
Disregard departure condition.  Wrong chart

## 2017-12-02 NOTE — ED Provider Notes (Signed)
Complains of generalized weakness approximate the past 3 weeks.  Progressively worsening.   symptoms accompanied by diminished oral intake and diminished appetite.  No abdominal pain.  She was treated recently for a cough with doxycycline Dr. Felecia ShellingFanta.  Cough has much improved.  She fell in her bathroom 5 days ago striking her head.  She does not recall whether she was using her cane or walker when she fell.  On exam patient is alert Glasgow Coma Score 15.  Not lethargic HEENT exam left-sided periorbital ecchymosis purplish yellow in appearance.  Otherwise normocephalic atraumatic.  Neck supple trachea midline lungs clear to auscultation, mildly tachypneic abdomen nondistended, nontender.  Pelvis stable nontender.  All 4 extremities without contusion abrasion or tenderness neurovascularly intact Chest x-ray viewed by me   Doug SouJacubowitz, Floreen Teegarden, MD 12/02/17 [id number]

## 2017-12-02 NOTE — ED Triage Notes (Signed)
Pt sent over by Dr. Ouida SillsFagan for possible dehydration. States pt has recently been on Doxycycline for a respiratory infection. Pt has been weak, dry mucous membranes, and hypotensive. Pt BP 90/70 at office. Pt also fell two days ago and pt has hematoma on LT side of face. Pt lethargic.

## 2017-12-02 NOTE — ED Provider Notes (Signed)
[facility] EMERGENCY DEPARTMENT Provider Note   CSN: [id number] Arrival date & time: 12/02/17  1601     History   Chief Complaint Chief Complaint  Patient presents with  . Weakness    HPI Robin Grant is a 84 y.o. female with a past medical history significant for HTN, hyperlipidemia and hypothyroidism and recent uri (finished a course of doxycycline for this yesterday presenting with persistent worsening weakness, nonproductive cough, shortness of breath and dehydration.  She feels alright at rest but endorses activity worsens her sob.  Denies wheezing.  She was seen by her pcp today and was sent here given her symptoms which also include a fall 2 days ago in her bathroom (states she got dizzy when she stood up) and fell hitting her head on the floor.  She denies loc, vision changes or n/v dizziness or confusion since the event but endorses persistent generalized weakness. She had diarrhea for one day last week which resolved spontaneously.  She has had a poor appetite but family has been pushing fluids, but still with reduced urine production. She denies chest pain, abdominal or back pain. She reports soreness in her bilateral shoulders yesterday but resolved today.  The history is provided by the patient.    Past Medical History:  Diagnosis Date  . Anxiety   . Chronic constipation   . HTN (hypertension)   . Hyperlipidemia   . Hypothyroidism   . Insomnia     Patient Active Problem List   Diagnosis Date Noted  . Left sided chest pain 07/19/2017  . Mixed hyperlipidemia 07/19/2017  . Rectal bleeding 07/26/2013  . Diverticulosis 07/26/2013  . Hypertension 07/26/2013  . Chronic kidney disease, stage III (moderate) (HCC) 07/26/2013  . Left sided abdominal pain 05/03/2011  . Laryngopharyngeal reflux (LPR) 05/03/2011  . Esophageal dysphagia 05/03/2011  . CONSTIPATION 02/15/2009  . HYPERLIPIDEMIA 02/14/2009  . HYPERTENSION 02/14/2009    Past Surgical History:  Procedure  Laterality Date  . APPENDECTOMY    . COLONOSCOPY  2007   Dr. [name], pancolonic diverticula.  . COLONOSCOPY N/A 07/28/2013   Procedure: COLONOSCOPY;  Surgeon: West Bali, MD;  Location: [facility] END[facility] SUITE;  Service: Endoscopy;  Laterality: N/A;  PLEASE SCHEDULE AT 3 PM PER DR. FIELDS   . ESOPHAGOGASTRODUODENOSCOPY  05/21/2011   Dr. [name]: normal tubular esophagus, s/p empiric dilation with 66 F, innocent appearing AVM, small hiatal hernia  . GIVENS CAPSULE STUDY N/A 07/30/2013   Procedure: GIVENS CAPSULE STUDY;  Surgeon: [name], MD;  Location: [facility] END[facility] SUITE;  Service: Endoscopy;  Laterality: N/A;  . PORT-A-CATH REMOVAL  2008  . right ankle surgery  2007   osteomyelitis  . TUBAL LIGATION      OB History    No data available       Home Medications    Prior to Admission medications   Medication Sig Start Date End Date Taking? Authorizing Provider  aspirin EC 81 MG tablet Take 1 tablet (81 mg total) by mouth daily. 07/21/17 07/21/18  Catarina Hartshorn, MD  levothyroxine (SYNTHROID, LEVOTHROID) 88 MCG tablet Take 88 mcg by mouth daily. 05/28/17   [provider]  LORazepam (ATIVAN) 0.5 MG tablet Take 0.5-1 tablets by mouth 2 (two) times daily as needed for anxiety or sleep.  04/18/17   [provider]  losartan (COZAAR) 100 MG tablet Take 100 mg by mouth daily.      [provider]  Multiple Vitamin (MULTIVITAMIN) capsule Take 1 capsule by mouth  daily.      [provider]  omeprazole (PRILOSEC) 20 MG capsule Take 20 mg by mouth 2 (two) times daily.     [provider]  sertraline (ZOLOFT) 100 MG tablet Take 100 mg by mouth daily.    [provider]    Family History Family History  Problem Relation Age of Onset  . Breast cancer Mother        deceased from MI  . Stroke Father        MI too  . Colon cancer Maternal Aunt   . Rheumatic fever Sister 43       open heart surgery age 45, deceased age 11  . Lung cancer Sister   .  Heart attack Sister   . Kidney disease Sister        born with one kidney, required transplant    Social History Social History   Tobacco Use  . Smoking status: Former Games developer  . Smokeless tobacco: Never Used  Substance Use Topics  . Alcohol use: No  . Drug use: No     Allergies   Hydrocodone and Codeine   Review of Systems Review of Systems  Constitutional: Positive for activity change and appetite change. Negative for fever.  HENT: Negative for congestion and sore throat.   Eyes: Negative.  Negative for visual disturbance.  Respiratory: Positive for cough and shortness of breath. Negative for chest tightness.   Cardiovascular: Negative for chest pain and palpitations.  Gastrointestinal: Positive for diarrhea. Negative for abdominal pain, nausea and vomiting.  Genitourinary: Negative.   Musculoskeletal: Negative for arthralgias, back pain, joint swelling and neck pain.  Skin: Positive for color change. Negative for rash and wound.  Neurological: Positive for dizziness and weakness. Negative for light-headedness, numbness and headaches.  Psychiatric/Behavioral: Negative.      Physical Exam Updated Vital Signs BP (!) 176/93   Pulse 73   Temp 97.6 F (36.4 C) (Temporal)   Resp (!) 27   Ht 5\' 5"  (1.651 m)   Wt 51.7 kg (114 lb)   SpO2 100%   BMI 18.97 kg/m   Physical Exam  Constitutional: She appears well-developed and well-nourished.  HENT:  Head: Normocephalic.  Large bruise without hematoma left temporal and forehead.  Eyes: Conjunctivae are normal.  Neck: Normal range of motion.  No midline ttp.  Cardiovascular: Normal rate, regular rhythm, normal heart sounds and intact distal pulses.  Pulmonary/Chest: Effort normal. No accessory muscle usage. Tachypnea noted. She has decreased breath sounds. She has no wheezes. She has rhonchi in the right lower field and the left lower field. She exhibits no tenderness.  Speaking in full sentences but is tachypneic.    Abdominal: Soft. Bowel sounds are normal. There is tenderness in the right upper quadrant. There is no rigidity, no rebound and no guarding.  Musculoskeletal: Normal range of motion.  Neurological: She is alert.  Skin: Skin is warm and dry.  Psychiatric: She has a normal mood and affect.  Nursing note and vitals reviewed.    ED Treatments / Results  Labs (all labs ordered are listed, but only abnormal results are displayed) Labs Reviewed  COMPREHENSIVE METABOLIC PANEL - Abnormal; Notable for the following components:      Result Value   CO2 15 (*)    BUN 58 (*)    Creatinine, Ser 2.07 (*)    GFR calc non Af Amer 21 (*)    GFR calc Af Amer 24 (*)    All other  components within normal limits  CBC WITH DIFFERENTIAL/PLATELET  LIPASE, BLOOD  LACTIC ACID, PLASMA  INFLUENZA PANEL BY PCR (TYPE A & B)  URINALYSIS, ROUTINE W REFLEX MICROSCOPIC    EKG  EKG Interpretation  Date/Time:  Monday December 02 2017 17:21:29 EST Ventricular Rate:  79 PR Interval:    QRS Duration: 92 QT Interval:  416 QTC Calculation: 477 R Axis:   -50 Text Interpretation:  Sinus rhythm Left anterior fascicular block No significant change since last tracing Confirmed by Doug Sou [phone]) on 12/02/2017 6:23:36 PM       Radiology Dg Chest 2 View  Result Date: 12/02/2017 CLINICAL DATA:  Shortness of breath. EXAM: CHEST  2 VIEW COMPARISON:  Radiograph of July 19, 2017. FINDINGS: The heart size and mediastinal contours are within normal limits. Both lungs are clear. Atherosclerosis of thoracic aorta is noted. No pneumothorax or pleural effusion is noted. The visualized skeletal structures are unremarkable. IMPRESSION: No active cardiopulmonary disease. Aortic Atherosclerosis (ICD10-I70.0). Electronically Signed   By: Lupita Raider, M.D.   On: 12/02/2017 17:54   Ct Head Wo Contrast  Result Date: 12/02/2017 CLINICAL DATA:  Status post fall 2 days ago with hematoma of left face. EXAM: CT HEAD WITHOUT  CONTRAST CT CERVICAL SPINE WITHOUT CONTRAST TECHNIQUE: Multidetector CT imaging of the head and cervical spine was performed following the standard protocol without intravenous contrast. Multiplanar CT image reconstructions of the cervical spine were also generated. COMPARISON:  None. FINDINGS: CT HEAD FINDINGS Brain: No evidence of acute infarction, hemorrhage, hydrocephalus, extra-axial collection or mass lesion/mass effect. There is chronic diffuse atrophy. Chronic bilateral periventricular white matter small vessel ischemic changes identified. 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 Vascular: No hyperdense vessel or unexpected calcification. Skull: Normal. Negative for fracture or focal lesion. Sinuses/Orbits: Minimal periosteal thickening of the left maxillary sinus is identified. The orbits are normal. Other: None. CT CERVICAL SPINE FINDINGS Alignment: Normal. Skull base and vertebrae: No acute fracture. No primary bone lesion or focal pathologic process. Soft tissues and spinal canal: No prevertebral fluid or swelling. No visible canal hematoma. Disc levels: Mild degenerative joint changes are identified in the upper and mid cervical spine with narrowed joint space and osteophyte formation. Upper chest: Negative. Other: Low-density nodule is identified in the left thyroid gland. IMPRESSION: No focal acute intracranial abnormality identified. Chronic diffuse atrophy. Chronic bilateral periventricular white matter small vessel ischemic change. No acute fracture or dislocation of cervical spine. Degenerative joint changes of cervical spine. Electronically Signed   By: Sherian Rein M.D.   On: 12/02/2017 18:07   Ct Cervical Spine Wo Contrast  Result Date: 12/02/2017 CLINICAL DATA:  Status post fall 2 days ago with hematoma of left face. EXAM: CT HEAD WITHOUT CONTRAST CT CERVICAL SPINE WITHOUT CONTRAST TECHNIQUE: Multidetector CT imaging of the head and cervical spine was performed following the  standard protocol without intravenous contrast. Multiplanar CT image reconstructions of the cervical spine were also generated. COMPARISON:  None. FINDINGS: CT HEAD FINDINGS Brain: No evidence of acute infarction, hemorrhage, hydrocephalus, extra-axial collection or mass lesion/mass effect. There is chronic diffuse atrophy. Chronic bilateral periventricular white matter small vessel ischemic changes identified. 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 Vascular: No hyperdense vessel or unexpected calcification. Skull: Normal. Negative for fracture or focal lesion. Sinuses/Orbits: Minimal periosteal thickening of the left maxillary sinus is identified. The orbits are normal. Other: None. CT CERVICAL SPINE FINDINGS Alignment: Normal. Skull base and vertebrae: No acute fracture. No primary bone  lesion or focal pathologic process. Soft tissues and spinal canal: No prevertebral fluid or swelling. No visible canal hematoma. Disc levels: Mild degenerative joint changes are identified in the upper and mid cervical spine with narrowed joint space and osteophyte formation. Upper chest: Negative. Other: Low-density nodule is identified in the left thyroid gland. IMPRESSION: No focal acute intracranial abnormality identified. Chronic diffuse atrophy. Chronic bilateral periventricular white matter small vessel ischemic change. No acute fracture or dislocation of cervical spine. Degenerative joint changes of cervical spine. Electronically Signed   By: Sherian ReinWei-Chen  Lin M.D.   On: 12/02/2017 18:07    Procedures Procedures (including critical care time)  Medications Ordered in ED Medications  sodium chloride 0.9 % bolus 500 mL (0 mLs Intravenous Stopped 12/02/17 1759)  thiamine (B-1) injection 100 mg (100 mg Intravenous Given 12/02/17 1758)     Initial Impression / Assessment and Plan / ED Course  I have reviewed the triage vital signs and the nursing notes.  Pertinent labs & imaging results that were available  during my care of the patient were reviewed by me and considered in my medical decision making (see chart for details).     Pt with acute dehydration with weakness and acute kidney injury/acute renal failure.  CT imaging reviewed and negative for acute injury from fall.  Pt discussed with Dr. Ethelda ChickJacubowitz who also saw pt and agrees with need for admission.  Discussed with Dr. Selena BattenKim who will admit pt.   Final Clinical Impressions(s) / ED Diagnoses   Final diagnoses:  Weakness  AKI (acute kidney injury) [facility](HCC)  Dehydration    ED Discharge Orders    None       Victoriano Laindol, Sahmir Weatherbee, PA-C 12/02/17 2039    Doug SouJacubowitz, Sam, MD 12/03/17 [phone]

## 2017-12-03 ENCOUNTER — Inpatient Hospital Stay (HOSPITAL_COMMUNITY): Payer: Medicare HMO

## 2017-12-03 DIAGNOSIS — E872 Acidosis, unspecified: Secondary | ICD-10-CM | POA: Diagnosis present

## 2017-12-03 DIAGNOSIS — E860 Dehydration: Secondary | ICD-10-CM | POA: Diagnosis present

## 2017-12-03 DIAGNOSIS — E039 Hypothyroidism, unspecified: Secondary | ICD-10-CM | POA: Diagnosis present

## 2017-12-03 LAB — COMPREHENSIVE METABOLIC PANEL
ALT: 15 U/L (ref 14–54)
ANION GAP: 10 (ref 5–15)
AST: 21 U/L (ref 15–41)
Albumin: 3.1 g/dL — ABNORMAL LOW (ref 3.5–5.0)
Alkaline Phosphatase: 69 U/L (ref 38–126)
BILIRUBIN TOTAL: 0.6 mg/dL (ref 0.3–1.2)
BUN: 51 mg/dL — ABNORMAL HIGH (ref 6–20)
CO2: 16 mmol/L — ABNORMAL LOW (ref 22–32)
Calcium: 9.4 mg/dL (ref 8.9–10.3)
Chloride: 111 mmol/L (ref 101–111)
Creatinine, Ser: 1.87 mg/dL — ABNORMAL HIGH (ref 0.44–1.00)
GFR calc Af Amer: 27 mL/min — ABNORMAL LOW (ref >60)
GFR calc non Af Amer: 24 mL/min — ABNORMAL LOW (ref >60)
GLUCOSE: 88 mg/dL (ref 65–99)
POTASSIUM: 4.5 mmol/L (ref 3.5–5.1)
SODIUM: 137 mmol/L (ref 135–145)
TOTAL PROTEIN: 6.2 g/dL — AB (ref 6.5–8.1)

## 2017-12-03 LAB — CBC
HEMATOCRIT: 34.8 % — AB (ref 36.0–46.0)
HEMOGLOBIN: 11.5 g/dL — AB (ref 12.0–15.0)
MCH: 30.5 pg (ref 26.0–34.0)
MCHC: 33 g/dL (ref 30.0–36.0)
MCV: 92.3 fL (ref 78.0–100.0)
Platelets: 197 10*3/uL (ref 150–400)
RBC: 3.77 MIL/uL — ABNORMAL LOW (ref 3.87–5.11)
RDW: 14.1 % (ref 11.5–15.5)
WBC: 5.4 10*3/uL (ref 4.0–10.5)

## 2017-12-03 MED ORDER — SODIUM BICARBONATE 8.4 % IV SOLN
INTRAVENOUS | Status: DC
  Administered 2017-12-03 – 2017-12-04 (×3): via INTRAVENOUS
  Filled 2017-12-03 (×6): qty 1000

## 2017-12-03 MED ORDER — ORAL CARE MOUTH RINSE
15.0000 mL | Freq: Two times a day (BID) | OROMUCOSAL | Status: DC
  Administered 2017-12-03 – 2017-12-05 (×4): 15 mL via OROMUCOSAL

## 2017-12-03 NOTE — ED Notes (Signed)
Called family and told them patient had been moved to Room 322.

## 2017-12-03 NOTE — Plan of Care (Signed)
  Acute Rehab PT Goals(only PT should resolve) Pt Will Go Supine/Side To Sit 12/03/2017 1606 - Progressing by Ocie BobWatkins, Verlie Hellenbrand, PT Flowsheets Taken 12/03/2017 1606  Pt will go Supine/Side to Sit with supervision Patient Will Transfer Sit To/From Stand 12/03/2017 1606 - Progressing by Ocie BobWatkins, Avril Busser, PT Flowsheets Taken 12/03/2017 1606  Patient will transfer sit to/from stand with min guard assist Pt Will Transfer Bed To Chair/Chair To Bed 12/03/2017 1606 - Progressing by Ocie BobWatkins, Rane Dumm, PT Flowsheets Taken 12/03/2017 1606  Pt will Transfer Bed to Chair/Chair to Bed min guard assist Pt Will Ambulate 12/03/2017 1606 - Progressing by Ocie BobWatkins, Kristina Mcnorton, PT Flowsheets Taken 12/03/2017 1606  Pt will Ambulate with min guard assist;75 feet;with rolling walker;with cane  4:07 PM, 12/03/17 Ocie BobJames Shriley Joffe, MPT Physical Therapist with [facility] 336 [phone] office [phone] mobile phone

## 2017-12-03 NOTE — ED Notes (Signed)
Pt given breakfast meal tray. 

## 2017-12-03 NOTE — Evaluation (Signed)
Physical Therapy Evaluation Patient Details Name: Robin Grant MRN: [id number] DOB: 08/07/1931 Today's Date: 12/03/2017   History of Present Illness  Robin Grant  is a 84 y.o. female  w hypertension, hyperlipidemia, hypothyroidsm, presents with feeling unwell for the past 1 week,  tx with doxycycline and had poor po intake. Pt felt weak and general malaise and therefore presented to Dr. Karleen Hampshire office and he sent her to ED for dehydration.      Clinical Impression  Patient demonstrates slow labored movement with unsteadiness during sit to stands, tends to veer to the left during gait training with near loss of balance, limited mostly due to fatigue and fair/poor standing balance.  Patient will benefit from continued physical therapy in hospital and recommended venue below to increase strength, balance, endurance for safe ADLs and gait.    Follow Up Recommendations SNF;Supervision/Assistance - 24 hour    Equipment Recommendations  None recommended by PT    Recommendations for Other Services       Precautions / Restrictions        Mobility  Bed Mobility Overal bed mobility: Needs Assistance Bed Mobility: Supine to Sit;Sit to Supine     Supine to sit: Min assist Sit to supine: Min assist      Transfers Overall transfer level: Needs assistance Equipment used: Rolling walker (2 wheeled) Transfers: Sit to/from [employer] Sit to Stand: Min assist;Mod assist Stand pivot transfers: Min assist;Mod assist          Ambulation/Gait Ambulation/Gait assistance: Min assist;Mod assist Ambulation Distance (Feet): 35 Feet Assistive device: Rolling walker (2 wheeled) Gait Pattern/deviations: Decreased step length - right;Decreased step length - left;Decreased stride length   Gait velocity interpretation: Below normal speed for age/gender General Gait Details: demonstrates slow unsteady cadence with frequent veering to the left, occasional near loss of balance,  limited secondary to fatigue  Stairs            Wheelchair Mobility    Modified Rankin (Stroke Patients Only)       Balance Overall balance assessment: Needs assistance Sitting-balance support: Feet supported;No upper extremity supported Sitting balance-Leahy Scale: Fair     Standing balance support: Bilateral upper extremity supported;During functional activity Standing balance-Leahy Scale: Poor Standing balance comment: fair/poor with RW                             Pertinent Vitals/Pain Pain Assessment: No/denies pain    Home Living Family/patient expects to be discharged to:: Private residence Living Arrangements: Alone Available Help at Discharge: Family(lives across street from her, son & daughter-n-law) Type of Home: House Home Access: Stairs to enter Entrance Stairs-Rails: Right;Left;Can reach both Entrance Stairs-Number of Steps: 3   Home Equipment: Walker - standard;Cane - single point;Shower seat;Bedside commode      Prior Function Level of Independence: Independent with assistive device(s)         Comments: ambulates with SPC mostly     Hand Dominance        Extremity/Trunk Assessment   Upper Extremity Assessment Upper Extremity Assessment: Defer to OT evaluation    Lower Extremity Assessment Lower Extremity Assessment: Generalized weakness    Cervical / Trunk Assessment Cervical / Trunk Assessment: Normal  Communication   Communication: No difficulties  Cognition Arousal/Alertness: Awake/alert Behavior During Therapy: WFL for tasks assessed/performed Overall Cognitive Status: Within Functional Limits for tasks assessed  General Comments      Exercises     Assessment/Plan    PT Assessment Patient needs continued PT services  PT Problem List Decreased strength;Decreased activity tolerance;Decreased balance;Decreased mobility       PT Treatment  Interventions Gait training;Functional mobility training;Stair training;Therapeutic activities;Therapeutic exercise;Patient/family education    PT Goals (Current goals can be found in the Care Plan section)  Acute Rehab PT Goals Patient Stated Goal: return home PT Goal Formulation: With patient/family Time For Goal Achievement: 12/17/17 Potential to Achieve Goals: Good    Frequency Min 3X/week   Barriers to discharge        Co-evaluation               AM-PAC PT "6 Clicks" Daily Activity  Outcome Measure Difficulty turning over in bed (including adjusting bedclothes, sheets and blankets)?: A Little Difficulty moving from lying on back to sitting on the side of the bed? : A Little Difficulty sitting down on and standing up from a chair with arms (e.g., wheelchair, bedside commode, etc,.)?: A Lot Help needed moving to and from a bed to chair (including a wheelchair)?: A Lot Help needed walking in hospital room?: A Lot Help needed climbing 3-5 steps with a railing? : A Lot 6 Click Score: 14    End of Session   Activity Tolerance: Patient tolerated treatment well;Patient limited by fatigue Patient left: in bed;with call bell/phone within reach;with bed alarm set;with family/visitor present Nurse Communication: Mobility status PT Visit Diagnosis: Unsteadiness on feet (R26.81);Other abnormalities of gait and mobility (R26.89);Muscle weakness (generalized) (M62.81)    Time: [id number] PT Time Calculation (min) (ACUTE ONLY): 24 min   Charges:   PT Evaluation $PT Eval Moderate Complexity: 1 Mod PT Treatments $Therapeutic Activity: 23-37 mins   PT G Codes:        4:05 PM, 12/03/17 Ocie BobJames Wilmot Quevedo, MPT [facility] Therapist [facility] 336 [phone] office [phone] mobile phone

## 2017-12-03 NOTE — Progress Notes (Signed)
Patient admitted to room 322. No complaints of pain, shortness of breath, chest pain, dizziness, nausea or vomiting. Patient alert and oriented x2.  Bruising noted to left side of face and scattered bruising noted to bilateral upper and lower extremities. Multiple scabs/abrasions noted of the skin. One dried scab noted to the right hand on the middle finger, one on the right ring finger and one on the right pinky finger. One open abrasion noted in between the right ring finger and the pinky finger. No active bleeding or drainage noted. Areas cleaned. One closed, dried scab noted to the right lateral knee approx 1cm x0.5cm and one closed, dried scab noted above the right lateral ankle. No signs or symptoms of infection to any areas noted. No active bleeding, no drainage and no foul odor noted. Blanchable redness noted to the sacrum. Patient turned on side. Family at bedside, emotional support provided to patient and family.

## 2017-12-03 NOTE — Progress Notes (Signed)
PROGRESS NOTE    Robin Grant  ONG:[id number] DOB: 08-06-1931 DOA: 12/02/2017 PCP: Carylon PerchesFagan, Roy, MD    Brief Narrative:  Patient is a 84 year old female history of hypertension, hyperlipidemia, hypothyroidism presented with feeling unwell times 1 week treated with Doxy and had poor oral intake.  Patient presented with weakness general malaise sent to PCPs office and sent to the ED due to dehydration from PCPs office.  Patient noted to be in acute on chronic kidney disease.  Influenza PCR was negative.   Assessment & Plan:   Principal Problem:   Acute renal failure superimposed on stage 3 chronic kidney disease (HCC) Active Problems:   Essential hypertension   Benign hypertension   Chronic kidney disease, stage III (moderate) (HCC)   Dehydration   Hypothyroidism   Acidosis  #1 acute on chronic kidney disease stage III Questionable etiology.  Likely secondary to prerenal azotemia in the setting of dehydration and ARB.  ARB on hold.  Patient denied any nausea or emesis.  Patient denies any diarrhea.  However patient noted to be clinically dehydrated on admission.  Blood pressure also noted to be borderline.  Renal function improving with hydration.  Renal ultrasound pending.  Continue IV fluids.  Follow.  2.  Hypertension Blood pressure somewhat borderline.  Continue IV fluids.  ARB on hold.  Follow.  3.  Dehydration IV fluids.  4.  Hypothyroidism Check a TSH.  Continue home dose Synthroid.  5.  Non-anion gap acidosis May be secondary to chronic kidney disease.  Place on a bicarb drip.  Continue hydration.  Follow.  6.  Gastroesophageal reflux disease PPI.  7.  Anxiety Continue Zoloft and Ativan as needed.   DVT prophylaxis: Lovenox Code Status: Full Family Communication: Updated patient.  No family at bedside. Disposition Plan: To be determined pending PT evaluation.   Consultants:   None  Procedures:   CT head CT C-spine 12/02/2017  Chest x-ray  12/02/2017  Renal ultrasound pending 12/03/2017  Antimicrobials:   None   Subjective: Sleeping.  Easily arousable.  Patient just woke up and cannot quite say exactly what brought her to the hospital however he is feeling better this morning per patient.  Objective: Vitals:   12/03/17 1330 12/03/17 1402 12/03/17 1453 12/03/17 1519  BP: 121/72 124/87  119/70  Pulse:  84  83  Resp: 18 18    Temp:  98.2 F (36.8 C)    TempSrc:  Oral    SpO2:  100%  98%  Weight:   52.1 kg (114 lb 13.8 oz)   Height:   5\' 2"  (1.575 m)     Intake/Output Summary (Last 24 hours) at 12/03/2017 1720 Last data filed at 12/03/2017 1500 Gross per 24 hour  Intake 461.67 ml  Output -  Net 461.67 ml   Filed Weights   12/02/17 1607 12/03/17 1453  Weight: 51.7 kg (114 lb) 52.1 kg (114 lb 13.8 oz)    Examination:  General exam: Appears calm and comfortable  Respiratory system: Clear to auscultation anterior lung fields.  No wheezes, no crackles, no rhonchi.Marland Kitchen. Respiratory effort normal. Cardiovascular system: S1 & S2 heard, RRR. No JVD, murmurs, rubs, gallops or clicks. No pedal edema. Gastrointestinal system: Abdomen is nondistended, soft and nontender. No organomegaly or masses felt. Normal bowel sounds heard. Central nervous system: Alert and oriented. No focal neurological deficits. Extremities: Symmetric 5 x 5 power. Skin: No rashes, lesions or ulcers Psychiatry: Judgement and insight appear normal. Mood & affect appropriate.  Data Reviewed: I have personally reviewed following labs and imaging studies  CBC: Recent Labs  Lab 12/02/17 1633 12/03/17 0715  WBC 6.2 5.4  NEUTROABS 4.1  --   HGB 12.0 11.5*  HCT 36.4 34.8*  MCV 91.0 92.3  PLT 237 197   Basic Metabolic Panel: Recent Labs  Lab 12/02/17 1633 12/03/17 0715  NA 137 137  K 4.5 4.5  CL 109 111  CO2 15* 16*  GLUCOSE 93 88  BUN 58* 51*  CREATININE 2.07* 1.87*  CALCIUM 10.2 9.4   GFR: Estimated Creatinine Clearance: 17.7  mL/min (A) (by C-G formula based on SCr of 1.87 mg/dL (H)). Liver Function Tests: Recent Labs  Lab 12/02/17 1633 12/03/17 0715  AST 26 21  ALT 18 15  ALKPHOS 80 69  BILITOT 0.9 0.6  PROT 7.1 6.2*  ALBUMIN 3.5 3.1*   Recent Labs  Lab 12/02/17 1644  LIPASE 40   No results for input(s): AMMONIA in the last 168 hours. Coagulation Profile: No results for input(s): INR, PROTIME in the last 168 hours. Cardiac Enzymes: No results for input(s): CKTOTAL, CKMB, CKMBINDEX, TROPONINI in the last 168 hours. BNP (last 3 results) No results for input(s): PROBNP in the last 8760 hours. HbA1C: No results for input(s): HGBA1C in the last 72 hours. CBG: No results for input(s): GLUCAP in the last 168 hours. Lipid Profile: No results for input(s): CHOL, HDL, LDLCALC, TRIG, CHOLHDL, LDLDIRECT in the last 72 hours. Thyroid Function Tests: No results for input(s): TSH, T4TOTAL, FREET4, T3FREE, THYROIDAB in the last 72 hours. Anemia Panel: No results for input(s): VITAMINB12, FOLATE, FERRITIN, TIBC, IRON, RETICCTPCT in the last 72 hours. Sepsis Labs: Recent Labs  Lab 12/02/17 1726  LATICACIDVEN 1.6    No results found for this or any previous visit (from the past 240 hour(s)).       Radiology Studies: Dg Chest 2 View  Result Date: 12/02/2017 CLINICAL DATA:  Shortness of breath. EXAM: CHEST  2 VIEW COMPARISON:  Radiograph of July 19, 2017. FINDINGS: The heart size and mediastinal contours are within normal limits. Both lungs are clear. Atherosclerosis of thoracic aorta is noted. No pneumothorax or pleural effusion is noted. The visualized skeletal structures are unremarkable. IMPRESSION: No active cardiopulmonary disease. Aortic Atherosclerosis (ICD10-I70.0). Electronically Signed   By: Lupita Raider, M.D.   On: 12/02/2017 17:54   Ct Head Wo Contrast  Result Date: 12/02/2017 CLINICAL DATA:  Status post fall 2 days ago with hematoma of left face. EXAM: CT HEAD WITHOUT CONTRAST CT  CERVICAL SPINE WITHOUT CONTRAST TECHNIQUE: Multidetector CT imaging of the head and cervical spine was performed following the standard protocol without intravenous contrast. Multiplanar CT image reconstructions of the cervical spine were also generated. COMPARISON:  None. FINDINGS: CT HEAD FINDINGS Brain: No evidence of acute infarction, hemorrhage, hydrocephalus, extra-axial collection or mass lesion/mass effect. There is chronic diffuse atrophy. Chronic bilateral periventricular white matter small vessel ischemic changes identified. 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 Vascular: No hyperdense vessel or unexpected calcification. Skull: Normal. Negative for fracture or focal lesion. Sinuses/Orbits: Minimal periosteal thickening of the left maxillary sinus is identified. The orbits are normal. Other: None. CT CERVICAL SPINE FINDINGS Alignment: Normal. Skull base and vertebrae: No acute fracture. No primary bone lesion or focal pathologic process. Soft tissues and spinal canal: No prevertebral fluid or swelling. No visible canal hematoma. Disc levels: Mild degenerative joint changes are identified in the upper and mid cervical spine with narrowed joint space and  osteophyte formation. Upper chest: Negative. Other: Low-density nodule is identified in the left thyroid gland. IMPRESSION: No focal acute intracranial abnormality identified. Chronic diffuse atrophy. Chronic bilateral periventricular white matter small vessel ischemic change. No acute fracture or dislocation of cervical spine. Degenerative joint changes of cervical spine. Electronically Signed   By: Sherian Rein M.D.   On: 12/02/2017 18:07   Ct Cervical Spine Wo Contrast  Result Date: 12/02/2017 CLINICAL DATA:  Status post fall 2 days ago with hematoma of left face. EXAM: CT HEAD WITHOUT CONTRAST CT CERVICAL SPINE WITHOUT CONTRAST TECHNIQUE: Multidetector CT imaging of the head and cervical spine was performed following the standard protocol  without intravenous contrast. Multiplanar CT image reconstructions of the cervical spine were also generated. COMPARISON:  None. FINDINGS: CT HEAD FINDINGS Brain: No evidence of acute infarction, hemorrhage, hydrocephalus, extra-axial collection or mass lesion/mass effect. There is chronic diffuse atrophy. Chronic bilateral periventricular white matter small vessel ischemic changes identified. 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 Vascular: No hyperdense vessel or unexpected calcification. Skull: Normal. Negative for fracture or focal lesion. Sinuses/Orbits: Minimal periosteal thickening of the left maxillary sinus is identified. The orbits are normal. Other: None. CT CERVICAL SPINE FINDINGS Alignment: Normal. Skull base and vertebrae: No acute fracture. No primary bone lesion or focal pathologic process. Soft tissues and spinal canal: No prevertebral fluid or swelling. No visible canal hematoma. Disc levels: Mild degenerative joint changes are identified in the upper and mid cervical spine with narrowed joint space and osteophyte formation. Upper chest: Negative. Other: Low-density nodule is identified in the left thyroid gland. IMPRESSION: No focal acute intracranial abnormality identified. Chronic diffuse atrophy. Chronic bilateral periventricular white matter small vessel ischemic change. No acute fracture or dislocation of cervical spine. Degenerative joint changes of cervical spine. Electronically Signed   By: Sherian Rein M.D.   On: 12/02/2017 18:07   US Renal  Result Date: 12/03/2017 CLINICAL DATA:  Acute renal failure. EXAM: RENAL / URINARY TRACT ULTRASOUND COMPLETE COMPARISON:  06/28/2017 FINDINGS: Right Kidney: Length: 8.9 cm. Increased parenchymal echogenicity. Mild diffuse renal cortical thinning. Exophytic cyst projects from the lower pole measuring 3.2 x 3.0 x 3.3 cm. Small cortical cyst arises from the upper pole measuring 9 mm. No other masses, no stones and no hydronephrosis. Left  Kidney: Length: 8.3 cm. Increased parenchymal echogenicity. Diffuse renal cortical thinning. Elongated cystic lesion arises from the upper pole measuring 16 x 6 x 11 mm, unchanged from the prior study. No other renal masses, no stones and no hydronephrosis. Bladder: Appears normal for degree of bladder distention. IMPRESSION: 1. No acute findings.  No hydronephrosis. 2. Increased renal parenchymal echogenicity and decreased renal size is consistent with medical renal disease, similar to the prior study. 3. Two right renal cysts. Elongated presumed cyst arises from the upper pole the left kidney. No other abnormalities. Electronically Signed   By: Amie Portland M.D.   On: 12/03/2017 09:48        Scheduled Meds: . aspirin EC  81 mg Oral Daily  . enoxaparin (LOVENOX) injection  30 mg Subcutaneous Q24H  . levothyroxine  88 mcg Oral Q breakfast  . multivitamin with minerals  1 tablet Oral Daily  . pantoprazole  40 mg Oral Daily  . sertraline  100 mg Oral Daily   Continuous Infusions: . dextrose 5 % 1,000 mL with sodium bicarbonate 150 mEq infusion 100 mL/hr at 12/03/17 1023     LOS: 1 day    Time spent: 35 minutes  Ramiro Harvest, MD [facility] Pager [phone]  If 7PM-7AM, please contact night-coverage [email or web address] Password [email or web address] 12/03/2017, 5:20 PM

## 2017-12-03 NOTE — ED Notes (Signed)
[phone] and wait for RN to call for report.

## 2017-12-04 DIAGNOSIS — N183 Chronic kidney disease, stage 3 (moderate): Secondary | ICD-10-CM

## 2017-12-04 DIAGNOSIS — E039 Hypothyroidism, unspecified: Secondary | ICD-10-CM

## 2017-12-04 DIAGNOSIS — I10 Essential (primary) hypertension: Secondary | ICD-10-CM

## 2017-12-04 DIAGNOSIS — E860 Dehydration: Secondary | ICD-10-CM

## 2017-12-04 DIAGNOSIS — N179 Acute kidney failure, unspecified: Principal | ICD-10-CM

## 2017-12-04 DIAGNOSIS — E872 Acidosis: Secondary | ICD-10-CM

## 2017-12-04 LAB — URINALYSIS, ROUTINE W REFLEX MICROSCOPIC
BILIRUBIN URINE: NEGATIVE
Glucose, UA: NEGATIVE mg/dL
HGB URINE DIPSTICK: NEGATIVE
KETONES UR: NEGATIVE mg/dL
Nitrite: NEGATIVE
PROTEIN: NEGATIVE mg/dL
SPECIFIC GRAVITY, URINE: 1.015 (ref 1.005–1.030)
pH: 8 (ref 5.0–8.0)

## 2017-12-04 LAB — SODIUM, URINE, RANDOM: Sodium, Ur: 139 mmol/L

## 2017-12-04 LAB — CBC
HCT: 32.8 % — ABNORMAL LOW (ref 36.0–46.0)
Hemoglobin: 10.7 g/dL — ABNORMAL LOW (ref 12.0–15.0)
MCH: 30.3 pg (ref 26.0–34.0)
MCHC: 32.6 g/dL (ref 30.0–36.0)
MCV: 92.9 fL (ref 78.0–100.0)
PLATELETS: 189 10*3/uL (ref 150–400)
RBC: 3.53 MIL/uL — AB (ref 3.87–5.11)
RDW: 14.1 % (ref 11.5–15.5)
WBC: 5.1 10*3/uL (ref 4.0–10.5)

## 2017-12-04 LAB — BASIC METABOLIC PANEL
ANION GAP: 10 (ref 5–15)
BUN: 36 mg/dL — ABNORMAL HIGH (ref 6–20)
CALCIUM: 8.9 mg/dL (ref 8.9–10.3)
CO2: 23 mmol/L (ref 22–32)
Chloride: 106 mmol/L (ref 101–111)
Creatinine, Ser: 1.43 mg/dL — ABNORMAL HIGH (ref 0.44–1.00)
GFR calc Af Amer: 38 mL/min — ABNORMAL LOW (ref >60)
GFR, EST NON AFRICAN AMERICAN: 33 mL/min — AB (ref >60)
GLUCOSE: 94 mg/dL (ref 65–99)
POTASSIUM: 4.1 mmol/L (ref 3.5–5.1)
SODIUM: 139 mmol/L (ref 135–145)

## 2017-12-04 LAB — TSH: TSH: 0.434 u[IU]/mL (ref 0.350–4.500)

## 2017-12-04 NOTE — Evaluation (Signed)
Occupational Therapy Evaluation Patient Details Name: Robin Grant MRN: [id number] DOB: 1931-03-24 Today's Date: 12/04/2017    History of Present Illness Robin Grant  is a 84 y.o. female  w hypertension, hyperlipidemia, hypothyroidsm, presents with feeling unwell for the past 1 week,  tx with doxycycline and had poor po intake. Pt felt weak and general malaise and therefore presented to Dr. Karleen Hampshire office and he sent her to ED for dehydration.     Clinical Impression   Pt received supine in bed, agreeable to OT evaluation. Pt completing seated ADLs with set-up and supervision, standing ADLs with min guard for balance deficits. Pt able to complete static standing tasks without LOB, however immediately stumbles to the right with functional mobility. RW used during evaluation however pt with poor knowledge of correct use, cuing throughout session for safety and proper usage. Recommend SNF on discharge to improve safety and independence in ADL completion and functional mobility tasks. No further acute OT services required at this time.     Follow Up Recommendations  SNF;Supervision/Assistance - 24 hour    Equipment Recommendations  None recommended by OT       Precautions / Restrictions Precautions Precautions: Fall Restrictions Weight Bearing Restrictions: No      Mobility Bed Mobility Overal bed mobility: Modified Independent                Transfers Overall transfer level: Needs assistance Equipment used: Rolling walker (2 wheeled) Transfers: Sit to/from [employer] Sit to Stand: Min guard Stand pivot transfers: Min guard;Min assist                ADL either performed or assessed with clinical judgement   ADL Overall ADL's : Needs assistance/impaired     Grooming: Wash/dry hands;Wash/dry face;Oral care;Min guard;Standing Grooming Details (indicate cue type and reason): Pt able to complete static standing tasks without LOB              Lower Body Dressing: Supervision/safety;Sitting/lateral leans   Toilet Transfer: Min guard;Regular Toilet;RW   Toileting- Clothing Manipulation and Hygiene: Min guard;Sit to/from stand       Functional mobility during ADLs: Min guard;Minimal assistance;Rolling walker(pt with multiple LOB to the right)       Vision Baseline Vision/History: Wears glasses Wears Glasses: At all times Patient Visual Report: No change from baseline Vision Assessment?: Yes Eye Alignment: Within Functional Limits Ocular Range of Motion: Within Functional Limits Alignment/Gaze Preference: Within Defined Limits Tracking/Visual Pursuits: Able to track stimulus in all quads without difficulty Saccades: Within functional limits Convergence: Within functional limits Visual Fields: No apparent deficits            Pertinent Vitals/Pain Pain Assessment: No/denies pain     Hand Dominance Right   Extremity/Trunk Assessment Upper Extremity Assessment Upper Extremity Assessment: Generalized weakness(grossly 4-/5)   Lower Extremity Assessment Lower Extremity Assessment: Defer to PT evaluation   Cervical / Trunk Assessment Cervical / Trunk Assessment: Normal   Communication Communication Communication: No difficulties   Cognition Arousal/Alertness: Awake/alert Behavior During Therapy: WFL for tasks assessed/performed Overall Cognitive Status: Within Functional Limits for tasks assessed                                                Home Living   Living Arrangements: Alone Available Help at Discharge: Family(live down the road) Type of Home:  House Home Access: Stairs to enter Enter[employer] CorporationEntrance Stairs-Number of Steps: 3 Entrance Stairs-Rails: Right;Left;Can reach both Home Layout: One level     Bathroom Shower/Tub: Chief Strategy OfficerTub/shower unit   Bathroom Toilet: Standard     Home Equipment: Walker - standard;Cane - single point;Shower seat;Bedside commode          Prior  Functioning/Environment Level of Independence: Independent with assistive device(s)        Comments: ambulates with SPC mostly, independent with ADLs        OT Problem List: Decreased strength;Decreased activity tolerance;Impaired balance (sitting and/or standing);Decreased safety awareness;Decreased knowledge of use of DME or AE          End of Session Equipment Utilized During Treatment: Gait belt;Rolling walker  Activity Tolerance: Patient tolerated treatment well Patient left: in chair;with call bell/phone within reach;with chair alarm set  OT Visit Diagnosis: Muscle weakness (generalized) (M62.81);Unsteadiness on feet (R26.81)                Time: [id number] OT Time Calculation (min): 32 min Charges:  OT General Charges $OT Visit: 1 Visit OT Evaluation $OT Eval Low Complexity: 1 Low OT Treatments $Self Care/Home Management : 8-22 mins   Ezra SitesLeslie Waunita Sandstrom, OTR/L  [phone] 12/04/2017, 8:12 AM

## 2017-12-04 NOTE — Progress Notes (Signed)
Physical Therapy Treatment Patient Details Name: Robin Grant MRN: [id number] DOB: 1931-06-10 Today's Date: 12/04/2017    History of Present Illness Shahd Occhipinti  is a 84 y.o. female  w hypertension, hyperlipidemia, hypothyroidsm, presents with feeling unwell for the past 1 week,  tx with doxycycline and had poor po intake. Pt felt weak and general malaise and therefore presented to Dr. Karleen Hampshire office and he sent her to ED for dehydration.      PT Comments    Pt sitting in chair upon therapist entrance, friendly and willing to participate.  No reports of pain through session.  Increased cadence and distance iwht gait, pt with tendency to veer Rt during gait, no LOB episodes but did hit 4 obstacles during gait.  EOS pt left in chair with call bell within reach.    Follow Up Recommendations  SNF;Supervision/Assistance - 24 hour     Equipment Recommendations       Recommendations for Other Services       Precautions / Restrictions Precautions Precautions: Fall Restrictions Weight Bearing Restrictions: No    Mobility  Bed Mobility Overal bed mobility: Modified Independent             General bed mobility comments: pt sitting in chair upon entrance  Transfers Overall transfer level: Modified independent Equipment used: Rolling walker (2 wheeled) Transfers: Sit to/from Stand Sit to Stand: Min guard Stand pivot transfers: Min guard;Min assist          Ambulation/Gait Ambulation/Gait assistance: Min assist Ambulation Distance (Feet): 100 Feet Assistive device: Rolling walker (2 wheeled) Gait Pattern/deviations: Decreased step length - right;Decreased step length - left;Decreased stride length     General Gait Details: increased cadence with frequent veering to the right, no LOB, did run RN into 4 obstacles all on the Right during gait, was limited to fatigue   Stairs            Wheelchair Mobility    Modified Rankin (Stroke Patients Only)        Balance                                            Cognition Arousal/Alertness: Awake/alert Behavior During Therapy: WFL for tasks assessed/performed Overall Cognitive Status: Within Functional Limits for tasks assessed                                        Exercises      General Comments        Pertinent Vitals/Pain Pain Assessment: No/denies pain    Home Living   Living Arrangements: Alone Available Help at Discharge: Family(live down the road) Type of Home: House Home Access: Stairs to enter Entrance Stairs-Rails: Right;Left;Can reach both Home Layout: One level Home Equipment: Walker - standard;Cane - single point;Shower seat;Bedside commode      Prior Function Level of Independence: Independent with assistive device(s)      Comments: ambulates with SPC mostly, independent with ADLs   PT Goals (current goals can now be found in the care plan section) Progress towards PT goals: Progressing toward goals    Frequency    Min 3X/week      PT Plan      Co-evaluation  AM-PAC PT "6 Clicks" Daily Activity  Outcome Measure  Difficulty turning over in bed (including adjusting bedclothes, sheets and blankets)?: A Little Difficulty moving from lying on back to sitting on the side of the bed? : A Little Difficulty sitting down on and standing up from a chair with arms (e.g., wheelchair, bedside commode, etc,.)?: A Lot Help needed moving to and from a bed to chair (including a wheelchair)?: A Lot Help needed walking in hospital room?: A Lot Help needed climbing 3-5 steps with a railing? : A Lot 6 Click Score: 14    End of Session Equipment Utilized During Treatment: Gait belt Activity Tolerance: Patient tolerated treatment well;Patient limited by fatigue Patient left: in chair;with call bell/phone within reach;with chair alarm set Nurse Communication: Mobility status PT Visit Diagnosis: Unsteadiness on feet  (R26.81);Other abnormalities of gait and mobility (R26.89);Muscle weakness (generalized) (M62.81)     Time: [id number] PT Time Calculation (min) (ACUTE ONLY): 15 min  Charges:  $Therapeutic Activity: 8-22 mins                    G Codes:       Becky SaxCasey Cockerham, LPTA; CBIS [phone]  Juel BurrowCockerham, Casey Jo 12/04/2017, 10:41 AM

## 2017-12-04 NOTE — Progress Notes (Signed)
Patient had not urinated all day, bladder scan performed showing 400cc in bladder. [facility]. Bladder scan performed again showing 300-350cc in bladder. Mid-level paged and orders given to perform I&O cath. I&O cath performed using sterile technique, 300cc of dark yellow urine obtained and urine specimen sent to lab.

## 2017-12-04 NOTE — Progress Notes (Signed)
PROGRESS NOTE  Robin Grant  XLK:[id number]  DOB: 07-22-1931  DOA: 12/02/2017 PCP: Carylon PerchesFagan, Roy, MD  Brief Admission Hx: Robin Grant  is a 84 y.o. female  w hypertension, hyperlipidemia, hypothyroidsm, presents with feeling unwell for the past 1 week,  tx with doxycycline and had poor po intake. Pt felt weak and general malaise and therefore presented to Dr. Karleen HampshireFagans office and he sent her to ED for dehydration.    MDM/Assessment & Plan:   1. Acute on chronic renal failure stage III-improving with IV fluid hydration likely the exacerbation was prerenal.  She is being hydrated with IV fluids and will continue that but lower the rate today.  Losartan has been stopped given the acute exacerbation. 2. Essential hypertension-blood pressures are controlled holding home losartan, hydralazine IV ordered as needed. 3. Nonelevated anion gap metabolic acidosis-resolved with IV fluid hydration. 4. GERD-continue Protonix for GI protection. 5. Generalized anxiety disorder-continue Zoloft daily and lorazepam as needed. 6. Hypothyroidism continue levothyroxine.  DVT prophylaxis: SCDs Code Status: Full code Family Communication: I spoke with son on telephone Disposition Plan: Home tomorrow, son declining SNF placement but does accept home health  Subjective: Patient sitting up in a chair today she is reporting that she is feeling a lot better.  She reported that she slept well overnight.  Objective: Vitals:   12/03/17 1519 12/03/17 2058 12/04/17 0458 12/04/17 1300  BP: 119/70 118/66 117/62 115/60  Pulse: 83 80 76 78  Resp:      Temp:  98.4 F (36.9 C) 98.1 F (36.7 C) 98.3 F (36.8 C)  TempSrc:  Oral Oral Oral  SpO2: 98% 98% 100% 99%  Weight:   53.3 kg (117 lb 8.1 oz)   Height:        Intake/Output Summary (Last 24 hours) at 12/04/2017 1344 Last data filed at 12/03/2017 1500 Gross per 24 hour  Intake 461.67 ml  Output -  Net 461.67 ml   Filed Weights   12/02/17 1607 12/03/17 1453  12/04/17 0458  Weight: 51.7 kg (114 lb) 52.1 kg (114 lb 13.8 oz) 53.3 kg (117 lb 8.1 oz)   REVIEW OF SYSTEMS  As per history otherwise all reviewed and reported negative  Exam:  General exam: Elderly female sitting in a chair in no apparent distress, cooperative and pleasant. Respiratory system: Clear. No increased work of breathing. Cardiovascular system: S1 & S2 heard. No JVD.   Gastrointestinal system: Abdomen is nondistended, soft and nontender. Normal bowel sounds heard. Central nervous system: Alert and oriented. No focal neurological deficits. Extremities: no CCE.  Data Reviewed: Basic Metabolic Panel: Recent Labs  Lab 12/02/17 1633 12/03/17 0715 12/04/17 0416  NA 137 137 139  K 4.5 4.5 4.1  CL 109 111 106  CO2 15* 16* 23  GLUCOSE 93 88 94  BUN 58* 51* 36*  CREATININE 2.07* 1.87* 1.43*  CALCIUM 10.2 9.4 8.9   Liver Function Tests: Recent Labs  Lab 12/02/17 1633 12/03/17 0715  AST 26 21  ALT 18 15  ALKPHOS 80 69  BILITOT 0.9 0.6  PROT 7.1 6.2*  ALBUMIN 3.5 3.1*   Recent Labs  Lab 12/02/17 1644  LIPASE 40   No results for input(s): AMMONIA in the last 168 hours. CBC: Recent Labs  Lab 12/02/17 1633 12/03/17 0715 12/04/17 0416  WBC 6.2 5.4 5.1  NEUTROABS 4.1  --   --   HGB 12.0 11.5* 10.7*  HCT 36.4 34.8* 32.8*  MCV 91.0 92.3 92.9  PLT 237 197 189  Cardiac Enzymes: No results for input(s): CKTOTAL, CKMB, CKMBINDEX, TROPONINI in the last 168 hours. CBG (last 3)  No results for input(s): GLUCAP in the last 72 hours. No results found for this or any previous visit (from the past 240 hour(s)).   Studies: Dg Chest 2 View  Result Date: 12/02/2017 CLINICAL DATA:  Shortness of breath. EXAM: CHEST  2 VIEW COMPARISON:  Radiograph of July 19, 2017. FINDINGS: The heart size and mediastinal contours are within normal limits. Both lungs are clear. Atherosclerosis of thoracic aorta is noted. No pneumothorax or pleural effusion is noted. The visualized  skeletal structures are unremarkable. IMPRESSION: No active cardiopulmonary disease. Aortic Atherosclerosis (ICD10-I70.0). Electronically Signed   By: Lupita Raider, M.D.   On: 12/02/2017 17:54   Ct Head Wo Contrast  Result Date: 12/02/2017 CLINICAL DATA:  Status post fall 2 days ago with hematoma of left face. EXAM: CT HEAD WITHOUT CONTRAST CT CERVICAL SPINE WITHOUT CONTRAST TECHNIQUE: Multidetector CT imaging of the head and cervical spine was performed following the standard protocol without intravenous contrast. Multiplanar CT image reconstructions of the cervical spine were also generated. COMPARISON:  None. FINDINGS: CT HEAD FINDINGS Brain: No evidence of acute infarction, hemorrhage, hydrocephalus, extra-axial collection or mass lesion/mass effect. There is chronic diffuse atrophy. Chronic bilateral periventricular white matter small vessel ischemic changes identified. 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 Vascular: No hyperdense vessel or unexpected calcification. Skull: Normal. Negative for fracture or focal lesion. Sinuses/Orbits: Minimal periosteal thickening of the left maxillary sinus is identified. The orbits are normal. Other: None. CT CERVICAL SPINE FINDINGS Alignment: Normal. Skull base and vertebrae: No acute fracture. No primary bone lesion or focal pathologic process. Soft tissues and spinal canal: No prevertebral fluid or swelling. No visible canal hematoma. Disc levels: Mild degenerative joint changes are identified in the upper and mid cervical spine with narrowed joint space and osteophyte formation. Upper chest: Negative. Other: Low-density nodule is identified in the left thyroid gland. IMPRESSION: No focal acute intracranial abnormality identified. Chronic diffuse atrophy. Chronic bilateral periventricular white matter small vessel ischemic change. No acute fracture or dislocation of cervical spine. Degenerative joint changes of cervical spine. Electronically Signed   By:  Sherian Rein M.D.   On: 12/02/2017 18:07   Ct Cervical Spine Wo Contrast  Result Date: 12/02/2017 CLINICAL DATA:  Status post fall 2 days ago with hematoma of left face. EXAM: CT HEAD WITHOUT CONTRAST CT CERVICAL SPINE WITHOUT CONTRAST TECHNIQUE: Multidetector CT imaging of the head and cervical spine was performed following the standard protocol without intravenous contrast. Multiplanar CT image reconstructions of the cervical spine were also generated. COMPARISON:  None. FINDINGS: CT HEAD FINDINGS Brain: No evidence of acute infarction, hemorrhage, hydrocephalus, extra-axial collection or mass lesion/mass effect. There is chronic diffuse atrophy. Chronic bilateral periventricular white matter small vessel ischemic changes identified. 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 Vascular: No hyperdense vessel or unexpected calcification. Skull: Normal. Negative for fracture or focal lesion. Sinuses/Orbits: Minimal periosteal thickening of the left maxillary sinus is identified. The orbits are normal. Other: None. CT CERVICAL SPINE FINDINGS Alignment: Normal. Skull base and vertebrae: No acute fracture. No primary bone lesion or focal pathologic process. Soft tissues and spinal canal: No prevertebral fluid or swelling. No visible canal hematoma. Disc levels: Mild degenerative joint changes are identified in the upper and mid cervical spine with narrowed joint space and osteophyte formation. Upper chest: Negative. Other: Low-density nodule is identified in the left thyroid gland. IMPRESSION:  No focal acute intracranial abnormality identified. Chronic diffuse atrophy. Chronic bilateral periventricular white matter small vessel ischemic change. No acute fracture or dislocation of cervical spine. Degenerative joint changes of cervical spine. Electronically Signed   By: Sherian Rein M.D.   On: 12/02/2017 18:07   US Renal  Result Date: 12/03/2017 CLINICAL DATA:  Acute renal failure. EXAM: RENAL / URINARY  TRACT ULTRASOUND COMPLETE COMPARISON:  06/28/2017 FINDINGS: Right Kidney: Length: 8.9 cm. Increased parenchymal echogenicity. Mild diffuse renal cortical thinning. Exophytic cyst projects from the lower pole measuring 3.2 x 3.0 x 3.3 cm. Small cortical cyst arises from the upper pole measuring 9 mm. No other masses, no stones and no hydronephrosis. Left Kidney: Length: 8.3 cm. Increased parenchymal echogenicity. Diffuse renal cortical thinning. Elongated cystic lesion arises from the upper pole measuring 16 x 6 x 11 mm, unchanged from the prior study. No other renal masses, no stones and no hydronephrosis. Bladder: Appears normal for degree of bladder distention. IMPRESSION: 1. No acute findings.  No hydronephrosis. 2. Increased renal parenchymal echogenicity and decreased renal size is consistent with medical renal disease, similar to the prior study. 3. Two right renal cysts. Elongated presumed cyst arises from the upper pole the left kidney. No other abnormalities. Electronically Signed   By: Amie Portland M.D.   On: 12/03/2017 09:48   Scheduled Meds: . aspirin EC  81 mg Oral Daily  . enoxaparin (LOVENOX) injection  30 mg Subcutaneous Q24H  . levothyroxine  88 mcg Oral Q breakfast  . mouth rinse  15 mL Mouth Rinse BID  . multivitamin with minerals  1 tablet Oral Daily  . pantoprazole  40 mg Oral Daily  . sertraline  100 mg Oral Daily   Continuous Infusions: . dextrose 5 % 1,000 mL with sodium bicarbonate 150 mEq infusion 75 mL/hr at 12/04/17 0940    Principal Problem:   Acute renal failure superimposed on stage 3 chronic kidney disease (HCC) Active Problems:   Essential hypertension   Benign hypertension   Chronic kidney disease, stage III (moderate) (HCC)   Dehydration   Hypothyroidism   Acidosis  Time spent:   Standley Dakins, MD, FAAFP [facility] Pager [phone]  If 7PM-7AM, please contact night-coverage [email or web address] Password [email or web address] 12/04/2017, 1:44 PM    LOS: 2  days

## 2017-12-05 LAB — RENAL FUNCTION PANEL
ALBUMIN: 2.8 g/dL — AB (ref 3.5–5.0)
ANION GAP: 12 (ref 5–15)
BUN: 22 mg/dL — ABNORMAL HIGH (ref 6–20)
CO2: 25 mmol/L (ref 22–32)
Calcium: 8.8 mg/dL — ABNORMAL LOW (ref 8.9–10.3)
Chloride: 100 mmol/L — ABNORMAL LOW (ref 101–111)
Creatinine, Ser: 1.34 mg/dL — ABNORMAL HIGH (ref 0.44–1.00)
GFR calc non Af Amer: 35 mL/min — ABNORMAL LOW (ref >60)
GFR, EST AFRICAN AMERICAN: 41 mL/min — AB (ref >60)
GLUCOSE: 98 mg/dL (ref 65–99)
PHOSPHORUS: 3.4 mg/dL (ref 2.5–4.6)
POTASSIUM: 3.4 mmol/L — AB (ref 3.5–5.1)
Sodium: 137 mmol/L (ref 135–145)

## 2017-12-05 MED ORDER — POTASSIUM CHLORIDE CRYS ER 20 MEQ PO TBCR
40.0000 meq | EXTENDED_RELEASE_TABLET | Freq: Once | ORAL | Status: AC
  Administered 2017-12-05: 40 meq via ORAL
  Filled 2017-12-05: qty 2

## 2017-12-05 NOTE — Care Management Note (Signed)
Case Management Note  Patient Details  Name: Robin Grant MRN: [id number] Date of Birth: [birth year]  Subjective/Objective:      Admitted from home, lives alone and has very supportive son and DIL who live close by and check on her often. Pt has been recommended for SNF. Declines placement at this time, agreeable to home health services. Pt has cane and RW if she needs them.              [facility]Plan[facility], pt agreeable, they are aware HH has 48 hrs to make first visit. Per son and DIL she will have 24/7 supervision for at least the next few days while she recovers. Therisa DoyneKathy Cheek, [facility], aware of [facility] and will pull pt info from chart.   Expected Discharge Date:    12/05/2017              Expected Discharge Plan:  Home w Home Health Services  In-House Referral:  Clinical Social Work  Discharge planning Services  CM Consult  Post Acute Care Choice:  Home Health, Durable Medical Equipment Choice offered to:  Patient  HH Arranged:  RN, PT [facility]  [facility]  Status of Service:  Completed, signed off  Malcolm MetroChildress, Lashay Osborne Demske, RN 12/05/2017, 11:50 AM

## 2017-12-05 NOTE — Progress Notes (Signed)
Discharge instructions/summary gone over with patient and son [name](Gary and daughter-in-law Selena BattenKim). Both patient and family expressed full understanding. Patient and family made aware of scheduled follow up appointment with primary provider time, date and location. All expressed full understanding of appointment information. Education provided on what medications to discontinue per instructions and which medications to continue and next doses due. All expressed full understanding of education. IV hepwell dc'd and pressure applied with no complications. Patient discharged home with all belongings (glasses and partial dentures). Son and daughter-in-law provided transportation home.

## 2017-12-05 NOTE — Discharge Instructions (Signed)
Please hold losartan until you follow up with your primary care provider to see if you need to go back on it.   Please try to see your primary care doctor in the next 7-10 days for recheck / hospital follow up.     Seek medical care or return if symptoms return, worsen or new problems develop.    Drink extra fluids over the next 5 days to stay hydrated.    Monitor urine output closely over the next 3 days and report to primary care physician if she is not urinating well.    Follow with Primary MD  [name], MD  and other consultant's as instructed your Hospitalist MD  Please get a complete blood count and chemistry panel checked by your Primary MD at your next visit, and again as instructed by your Primary MD.  Get Medicines reviewed and adjusted: Please take all your medications with you for your next visit with your Primary MD  Laboratory/radiological data: Please request your Primary MD to go over all hospital tests and procedure/radiological results at the follow up, please ask your Primary MD to get all Hospital records sent to his/her office.  In some cases, they will be blood work, cultures and biopsy results pending at the time of your discharge. Please request that your primary care M.D. follows up on these results.  Also Note the following: If you experience worsening of your admission symptoms, develop shortness of breath, life threatening emergency, suicidal or homicidal thoughts you must seek medical attention immediately by calling 911 or calling your MD immediately  if symptoms less severe.  You must read complete instructions/literature along with all the possible adverse reactions/side effects for all the Medicines you take and that have been prescribed to you. Take any new Medicines after you have completely understood and accpet all the possible adverse reactions/side effects.   Do not drive when taking Pain medications or sleeping medications (Benzodaizepines)  Do  not take more than prescribed Pain, Sleep and Anxiety Medications. It is not advisable to combine anxiety,sleep and pain medications without talking with your primary care practitioner  Special Instructions: If you have smoked or chewed Tobacco  in the last 2 yrs please stop smoking, stop any regular Alcohol  and or any Recreational drug use.  Wear Seat belts while driving.  Please note: You were cared for by a hospitalist during your hospital stay. Once you are discharged, your primary care physician will handle any further medical issues. Please note that NO REFILLS for any discharge medications will be authorized once you are discharged, as it is imperative that you return to your primary care physician (or establish a relationship with a primary care physician if you do not have one) for your post hospital discharge needs so that they can reassess your need for medications and monitor your lab values.      Acute Kidney Injury, Adult Acute kidney injury is a sudden worsening of kidney function. The kidneys are organs that have several jobs. They filter the blood to remove waste products and extra fluid. They also maintain a healthy balance of minerals and hormones in the body, which helps control blood pressure and keep bones strong. With this condition, your kidneys do not do their jobs as well as they should. This condition ranges from mild to severe. Over time it may develop into long-lasting (chronic) kidney disease. Early detection and treatment may prevent acute kidney injury from developing into a chronic condition. What are the  causes? Common causes of this condition include:  A problem with blood flow to the kidneys. This may be caused by: ? Low blood pressure (hypotension) or shock. ? Blood loss. ? Heart and blood vessel (cardiovascular) disease. ? Severe burns. ? Liver disease.  Direct damage to the kidneys. This may be caused by: ? Certain medicines. ? A kidney  infection. ? Poisoning. ? Being around or in contact with toxic substances. ? A surgical wound. ? A hard, direct hit to the kidney area.  A sudden blockage of urine flow. This may be caused by: ? Cancer. ? Kidney stones. ? An enlarged prostate in males.  What are the signs or symptoms? Symptoms of this condition may not be obvious until the condition becomes severe. Symptoms of this condition can include:  Tiredness (lethargy), or difficulty staying awake.  Nausea or vomiting.  Swelling (edema) of the face, legs, ankles, or feet.  Problems with urination, such as: ? Abdominal pain, or pain along the side of your stomach (flank). ? Decreased urine production. ? Decrease in the force of urine flow.  Muscle twitches and cramps, especially in the legs.  Confusion or trouble concentrating.  Loss of appetite.  Fever.  How is this diagnosed? This condition may be diagnosed with tests, including:  Blood tests.  Urine tests.  Imaging tests.  A test in which a sample of tissue is removed from the kidneys to be examined under a microscope (kidney biopsy).  How is this treated? Treatment for this condition depends on the cause and how severe the condition is. In mild cases, treatment may not be needed. The kidneys may heal on their own. In more severe cases, treatment will involve:  Treating the cause of the kidney injury. This may involve changing any medicines you are taking or adjusting your dosage.  Fluids. You may need specialized IV fluids to balance your body's needs.  Having a catheter placed to drain urine and prevent blockages.  Preventing problems from occurring. This may mean avoiding certain medicines or procedures that can cause further injury to the kidneys.  In some cases treatment may also require:  A procedure to remove toxic wastes from the body (dialysis or continuous renal replacement therapy - CRRT).  Surgery. This may be done to repair a torn  kidney, or to remove the blockage from the urinary system.  Follow these instructions at home: Medicines  Take over-the-counter and prescription medicines only as told by your health care provider.  Do not take any new medicines without your health care provider's approval. Many medicines can worsen your kidney damage.  Do not take any vitamin and mineral supplements without your health care provider's approval. Many nutritional supplements can worsen your kidney damage. Lifestyle  If your health care provider prescribed changes to your diet, follow them. You may need to decrease the amount of protein you eat.  Achieve and maintain a healthy weight. If you need help with this, ask your health care provider.  Start or continue an exercise plan. Try to exercise at least 30 minutes a day, 5 days a week.  Do not use any tobacco products, such as cigarettes, chewing tobacco, and e-cigarettes. If you need help quitting, ask your health care provider. General instructions  Keep track of your blood pressure. Report changes in your blood pressure as told by your health care provider.  Stay up to date with immunizations. Ask your health care provider which immunizations you need.  Keep all follow-up visits as  told by your health care provider. This is important. Where to find more information:  American Association of Kidney Patients: ResidentialShow.is  [employer]: www.kidney.org  [employer]: FightingMatch.com.ee  [employer] [facility]: ? www.lifeoptions.org ? www.kidneyschool.org Contact a health care provider if:  Your symptoms get worse.  You develop new symptoms. Get help right away if:  You develop symptoms of worsening kidney disease, which include: ? Headaches. ? Abnormally dark or light skin. ? Easy bruising. ? Frequent hiccups. ? Chest pain. ? Shortness of breath. ? End of menstruation in women. ? Seizures. ? Confusion or altered mental  status. ? Abdominal or back pain. ? Itchiness.  You have a fever.  Your body is producing less urine.  You have pain or bleeding when you urinate. Summary  Acute kidney injury is a sudden worsening of kidney function.  Acute kidney injury can be caused by problems with blood flow to the kidneys, direct damage to the kidneys, and sudden blockage of urine flow.  Symptoms of this condition may not be obvious until it becomes severe. Symptoms may include edema, lethargy, confusion, nausea or vomiting, and problems passing urine.  This condition can usually be diagnosed with blood tests, urine tests, and imaging tests. Sometimes a kidney biopsy is done to diagnose this condition.  Treatment for this condition often involves treating the underlying cause. It is treated with fluids, medicines, dialysis, diet changes, or surgery. This information is not intended to replace advice given to you by your health care provider. Make sure you discuss any questions you have with your health care provider. Document Released: 04/09/2011 Document Revised: 01/24/2017 Document Reviewed: 09/14/2016 Elsevier Interactive Patient Education  2018 [employer].   Dehydration Dehydration is when there is not enough fluid or water in your body. This happens when you lose more fluids than you take in. People who are age 78 or older have a higher risk of getting dehydrated. Dehydration can range from mild to very bad. It should be treated right away to keep it from getting very bad. Symptoms of mild dehydration may include:  Thirst.  Dry lips.  Slightly dry mouth.  Dry, warm skin.  Dizziness. Symptoms of moderate dehydration may include:  Very dry mouth.  Muscle cramps.  Dark pee (urine). Pee may be the color of tea.  Your body making less pee.  Your eyes making fewer tears.  Heartbeat that is uneven or faster than normal (palpitations).  Headache.  Light-headedness, especially when you  stand up from sitting.  Fainting (syncope). Symptoms of very bad dehydration may include:  Changes in skin, such as: ? Cold and clammy skin. ? Blotchy (mottled) or pale skin. ? Skin that does not quickly return to normal after being lightly pinched and let go (poor skin turgor).  Changes in body fluids, such as: ? Feeling very thirsty. ? Your eyes making fewer tears. ? Not sweating when body temperature is high, such as in hot weather. ? Your body making very little pee.  Changes in vital signs, such as: ? Weak pulse. ? Pulse that is more than 100 beats a minute when you are sitting still. ? Fast breathing. ? Low blood pressure.  Other changes, such as: ? Sunken eyes. ? Cold hands and feet. ? Confusion. ? Lack of energy (lethargy). ? Trouble waking up from sleep. ? Short-term weight loss. ? Unconsciousness. Follow these instructions at home:  If told by your doctor, drink an ORS: ? Make an ORS by using  instructions on the package. ? Start by drinking small amounts, about  cup (120 mL) every 5-10 minutes. ? Slowly drink more until you have had the amount that your doctor said to have.  Drink enough clear fluid to keep your pee clear or pale yellow. If you were told to drink an ORS, finish the ORS first, then start slowly drinking clear fluids. Drink fluids such as: ? Water. Do not drink only water by itself. Doing that can make the salt (sodium) level in your body get too low (hyponatremia). ? Ice chips. ? Fruit juice that you have added water to (diluted). ? Low-calorie sports drinks.  Avoid: ? Alcohol. ? Drinks that have a lot of sugar. These include high-calorie sports drinks, fruit juice that does not have water added, and soda. ? Caffeine. ? Foods that are greasy or have a lot of fat or sugar.  Take over-the-counter and prescription medicines only as told by your doctor.  Do not take salt tablets. Doing that can make the salt level in your body get too high  (hypernatremia).  Eat foods that have minerals (electrolytes). Examples include bananas, oranges, potatoes, tomatoes, and spinach.  Keep all follow-up visits as told by your doctor. This is important. Contact a doctor if:  You have belly (abdominal) pain that: ? Gets worse. ? Stays in one area (localizes).  You have a rash.  You have a stiff neck.  You get angry or annoyed more easily than normal (irritability).  You are more sleepy than normal.  You have a harder time waking up than normal.  You feel: ? Weak. ? Dizzy. ? Very thirsty. Get help right away if:  You have symptoms of very bad dehydration.  You cannot drink fluids without throwing up (vomiting).  Your symptoms get worse with treatment.  You have a fever.  You have a very bad headache.  You are throwing up or having watery poop (diarrhea) and it: ? Gets worse. ? Does not go away.  You have diarrhea for more than 24 hours.  You have blood or something green (bile) in your throw-up.  You have blood in your poop (stool). This may cause poop to look black and tarry.  You have not peed in 6-8 hours.  You have peed (urinated) only a small amount of very dark pee during 6-8 hours.  You pass out (faint).  Your heart rate when you are sitting still is more than 100 beats a minute.  You have trouble breathing. This information is not intended to replace advice given to you by your health care provider. Make sure you discuss any questions you have with your health care provider. Document Released: 09/13/2011 Document Revised: 04/13/2016 Document Reviewed: 11/18/2015 Elsevier Interactive Patient Education  2018 [employer]     Rehydration Rehydration is the replacement of body fluids and salts and minerals (electrolytes) that are lost during dehydration. Dehydration is when there is not enough fluid or water in the body. This happens when you lose more fluids than you take in. People who are age 82 or  older have a higher risk of dehydration than younger adults. Common causes of dehydration include:  Vomiting.  Diarrhea.  Excessive sweating, such as from heat exposure or exercise.  Taking medicines that cause the body to lose excess fluid (diuretics).  Impaired kidney function.  Not drinking enough fluid.  Certain illnesses or infections.  Certain poorly controlled long-term (chronic) illnesses, such as diabetes, heart disease, and kidney disease.  Symptoms of mild dehydration may include thirst, dry lips and mouth, dry skin, and dizziness. Symptoms of severe dehydration may include increased heart rate, confusion, fainting, and not urinating. You can rehydrate by drinking certain fluids or getting fluids through an IV tube, as told by your health care provider. What are the risks? Generally, rehydration is safe. However, one problem that can happen is taking in too much fluid (overhydration). This is rare. If overhydration happens, it can cause an electrolyte imbalance, kidney failure, fluid in the lungs, or a decrease in salt (sodium) levels in the body. How to rehydrate Follow instructions from your health care provider for rehydration. The kind of fluid you should drink and the amount you should drink depend on your condition.  If directed by your health care provider, drink an oral rehydration solution (ORS). This is a drink designed to treat dehydration that is found in pharmacies and retail stores. ? Make an ORS by following instructions on the package. ? Start by drinking small amounts, about  cup (120 mL) every 5-10 minutes. ? Slowly increase how much you drink until you have taken the amount recommended by your health care provider.  Drink enough clear fluids to keep your urine clear or pale yellow. If you were instructed to drink an ORS, finish the ORS first, then start slowly drinking other clear fluids. Drink fluids such as: ? Water. Do not drink only water. Doing that  can lead to having too little sodium in your body (hyponatremia). ? Ice chips. ? Fruit juice that you have added water to (diluted juice). ? Low-calorie sports drinks.  If you are severely dehydrated, your health care provider may recommend that you receive fluids through an IV tube in the hospital.  Do not take sodium tablets. Doing that can lead to the condition of having too much sodium in your body (hypernatremia).  Eating while you rehydrate Follow instructions from your health care provider about what to eat while you rehydrate. Your health care provider may recommend that you slowly begin eating regular foods in small amounts.  Eat foods that contain a healthy balance of electrolytes, such as bananas, oranges, potatoes, tomatoes, and spinach.  Avoid foods that are greasy or contain a lot of fat or sugar.  In some cases, you may get nutrition through a feeding tube that is passed through your nose and into your stomach (nasogastric tube, or NG tube). This may be done if you have uncontrolled vomiting or diarrhea. Beverages to avoid Certain beverages may make dehydration worse. While you rehydrate, avoid:  Alcohol.  Caffeine.  Drinks that contain a lot of sugar. These include: ? High-calorie sports drinks. ? Fruit juice that is not diluted. ? Soda.  Check nutrition labels to see how much sugar or caffeine a beverage contains. Signs of dehydration recovery You may be recovering from dehydration if:  You urinate more often than you did before you started rehydrating.  Your urine is clear or pale yellow.  Your energy level improves.  You vomit less frequently.  You have diarrhea less frequently.  Your appetite improves or returns to normal.  You feel less dizzy or less light-headed.  Your skin tone and color start to look more normal.  Contact a health care provider if:  You continue to have symptoms of mild dehydration, such as: ? Thirst. ? Dry  lips. ? Slightly dry mouth. ? Dry, warm skin. ? Dizziness.  You continue to vomit or have diarrhea. Get help right away  if:  You have symptoms of dehydration that get worse.  You feel: ? Confused. ? Weak. ? Like you are going to faint.  You have not urinated in 6-8 hours.  You have very dark urine.  You have trouble breathing.  Your heart rate while sitting still is over 100 beats a minute.  You cannot drink fluids without vomiting.  You have vomiting or diarrhea that: ? Gets worse. ? Does not go away.  You have a fever. This information is not intended to replace advice given to you by your health care provider. Make sure you discuss any questions you have with your health care provider. Document Released: 12/17/2011 Document Revised: 04/13/2016 Document Reviewed: 11/18/2015 Elsevier Interactive Patient Education  2018 [employer].     Weakness Weakness is a lack of strength. You may feel weak all over your body (generalized), or you may feel weak in one specific part of your body (focal). There are many potential causes of weakness. Sometimes, the cause of your weakness may not be known. Some causes of weakness can be serious, so it is important to see your doctor. Follow these instructions at home:  Rest as needed.  Try to get enough sleep. Talk to your doctor about how much sleep you need each night.  Take over-the-counter and prescription medicines only as told by your doctor.  Eat a healthy, well-balanced diet. This includes: ? Proteins to build muscles, such as lean meats and fish. ? Fresh fruits and vegetables. ? Carbohydrates to boost energy, such as whole grains.  Drink enough fluid to keep your pee (urine) clear or pale yellow.  Do strength exercises, such as arm curls and leg raises, for 30 minutes at least 2 days a week or as told by your doctor.  Think about working with a physical therapist or trainer to help you get stronger.  Keep all  follow-up visits as told by your doctor. This is important. Contact a doctor if:  Your weakness does not get better or it gets worse.  Your weakness affects your ability to: ? Think clearly. ? Do your normal daily activities. Get help right away if:  You have sudden weakness.  You have trouble breathing or shortness of breath.  You have problems with your vision.  You have trouble talking or swallowing.  You have trouble standing or walking.  You have chest pain.  You are light-headed.  You pass out (lose consciousness). This information is not intended to replace advice given to you by your health care provider. Make sure you discuss any questions you have with your health care provider. Document Released: 09/06/2008 Document Revised: 10/20/2015 Document Reviewed: 07/15/2015 Elsevier Interactive Patient Education  2018 [employer].    Fall Prevention in the Home Falls can cause injuries. They can happen to people of all ages. There are many things you can do to make your home safe and to help prevent falls. What can I do on the outside of my home?  Regularly fix the edges of walkways and driveways and fix any cracks.  Remove anything that might make you trip as you walk through a door, such as a raised step or threshold.  Trim any bushes or trees on the path to your home.  Use bright outdoor lighting.  Clear any walking paths of anything that might make someone trip, such as rocks or tools.  Regularly check to see if handrails are loose or broken. Make sure that both sides of any steps  have handrails.  Any raised decks and porches should have guardrails on the edges.  Have any leaves, snow, or ice cleared regularly.  Use sand or salt on walking paths during winter.  Clean up any spills in your garage right away. This includes oil or grease spills. What can I do in the bathroom?  Use night lights.  Install grab bars by the toilet and in the tub and shower.  Do not use towel bars as grab bars.  Use non-skid mats or decals in the tub or shower.  If you need to sit down in the shower, use a plastic, non-slip stool.  Keep the floor dry. Clean up any water that spills on the floor as soon as it happens.  Remove soap buildup in the tub or shower regularly.  Attach bath mats securely with double-sided non-slip rug tape.  Do not have throw rugs and other things on the floor that can make you trip. What can I do in the bedroom?  Use night lights.  Make sure that you have a light by your bed that is easy to reach.  Do not use any sheets or blankets that are too big for your bed. They should not hang down onto the floor.  Have a firm chair that has side arms. You can use this for support while you get dressed.  Do not have throw rugs and other things on the floor that can make you trip. What can I do in the kitchen?  Clean up any spills right away.  Avoid walking on wet floors.  Keep items that you use a lot in easy-to-reach places.  If you need to reach something above you, use a strong step stool that has a grab bar.  Keep electrical cords out of the way.  Do not use floor polish or wax that makes floors slippery. If you must use wax, use non-skid floor wax.  Do not have throw rugs and other things on the floor that can make you trip. What can I do with my stairs?  Do not leave any items on the stairs.  Make sure that there are handrails on both sides of the stairs and use them. Fix handrails that are broken or loose. Make sure that handrails are as long as the stairways.  Check any carpeting to make sure that it is firmly attached to the stairs. Fix any carpet that is loose or worn.  Avoid having throw rugs at the top or bottom of the stairs. If you do have throw rugs, attach them to the floor with carpet tape.  Make sure that you have a light switch at the top of the stairs and the bottom of the stairs. If you do not have them,  ask someone to add them for you. What else can I do to help prevent falls?  Wear shoes that: ? Do not have high heels. ? Have rubber bottoms. ? Are comfortable and fit you well. ? Are closed at the toe. Do not wear sandals.  If you use a stepladder: ? Make sure that it is fully opened. Do not climb a closed stepladder. ? Make sure that both sides of the stepladder are locked into place. ? Ask someone to hold it for you, if possible.  Clearly mark and make sure that you can see: ? Any grab bars or handrails. ? First and last steps. ? Where the edge of each step is.  Use tools that help you move around (  mobility aids) if they are needed. These include: ? Canes. ? Walkers. ? Scooters. ? Crutches.  Turn on the lights when you go into a dark area. Replace any light bulbs as soon as they burn out.  Set up your furniture so you have a clear path. Avoid moving your furniture around.  If any of your floors are uneven, fix them.  If there are any pets around you, be aware of where they are.  Review your medicines with your doctor. Some medicines can make you feel dizzy. This can increase your chance of falling. Ask your doctor what other things that you can do to help prevent falls. This information is not intended to replace advice given to you by your health care provider. Make sure you discuss any questions you have with your health care provider. Document Released: 07/21/2009 Document Revised: 03/01/2016 Document Reviewed: 10/29/2014 Elsevier Interactive Patient Education  [employer].

## 2017-12-05 NOTE — Discharge Summary (Signed)
Physician Discharge Summary  Robin Grant ZOX:[id number] DOB: 10-28-1930 DOA: 12/02/2017  PCP: Robin Perches, MD  Admit date: 12/02/2017 Discharge date: 12/05/2017  Admitted From: HOME  Disposition: HOME   Recommendations for Outpatient Follow-up:  1. Follow up with PCP in 1 weeks 2. Please obtain BMP/CBC in one week 3. Please monitor urine output over next week and refer to urology if having difficulty urinating. 4. Please follow up on the following pending results: FINAL CULTURE DATA  Please hold losartan until you follow up with your primary care provider to see if you need to go back on it.   Please try to see your primary care doctor in the next 7-10 days for recheck / hospital follow up.     Seek medical care or return if symptoms return, worsen or new problems develop.    Drink extra fluids over the next 5 days to stay hydrated.    Monitor urine output closely over the next 3 days and report to primary care physician if she is not urinating well.   Home Health:  PT, RN  Discharge Condition: STABLE   CODE STATUS: FULL    Brief Hospitalization Summary: Please see all hospital notes, images, labs for full details of the hospitalization. HPI:    Robin Grant  is a 84 y.o. female  w hypertension, hyperlipidemia, hypothyroidsm, presents with feeling unwell for the past 1 week,  tx with doxycycline and had poor po intake. Pt felt weak and general malaise and therefore presented to Dr. Karleen Grant office and he sent her to ED for dehydration.    In ED,  CXR +> no active cardiopulmonary disease  Wbc 6.2, Hgb 12.0, Plt 237  Na 137, K 4.5 Hco3 15 BUn 58, Creatinine 2.07  Ast 26, Alt 18  Influenza negative  Pt will be admitted for ARF.    Brief Admission Hx: [name] a86 y.o.femalew hypertension, hyperlipidemia, hypothyroidsm, presents with feeling unwell for the past 1 week, tx with doxycycline and had poor po intake. Pt felt weak and general malaise and  therefore presented to Dr. Karleen Grant office and he sent her to ED for dehydration.   MDM/Assessment & Plan:   1. Acute on chronic renal failure stage III-improving with IV fluid hydration likely the exacerbation was prerenal.  She was hydrated with IV fluids.  Losartan has been stopped as her BPs are well controlled on no blood pressure meds. 2. Essential hypertension-blood pressures are controlled on no meds. Did not restart losartan at discharge. Recommend patient follow up with PCP to re-assess the need to go back on BP meds.   3. Nonelevated anion gap metabolic acidosis-resolved with IV fluid hydration. 4. GERD-continue Protonix for GI protection. 5. Generalized anxiety disorder-continue Zoloft daily and lorazepam as needed. 6. Hypothyroidism continue levothyroxine. 7. Urinary retention - Pt had some urinary retention yesterday relieved with in/out cath.  She has been urinating today, 350 cc already reported.  Gave instructions to monitor urine output at home and to report to PCP if she is having any difficulty urinating so that she can be re-assessed and/or referred to urology.    DVT prophylaxis: SCDs Code Status: Full code Family Communication: I spoke with son on telephone Disposition Plan: Home tomorrow, son declining SNF placement but does accept home health  Discharge Diagnoses:  Principal Problem:   Acute renal failure superimposed on stage 3 chronic kidney disease (HCC) Active Problems:   Essential hypertension   Benign hypertension   Chronic kidney disease, stage III (moderate) (  HCC)   Dehydration   Hypothyroidism   Acidosis  Discharge Instructions: Discharge Instructions    Call MD for:  difficulty breathing, headache or visual disturbances   Complete by:  As directed    Call MD for:  extreme fatigue   Complete by:  As directed    Call MD for:  persistant dizziness or light-headedness   Complete by:  As directed    Call MD for:  persistant nausea and vomiting    Complete by:  As directed    Increase activity slowly   Complete by:  As directed      Allergies as of 12/05/2017      Reactions   Hydrocodone Nausea And Vomiting   Codeine Nausea And Vomiting      Medication List    STOP taking these medications   doxycycline 100 MG capsule Commonly known as:  VIBRAMYCIN   losartan 100 MG tablet Commonly known as:  COZAAR     TAKE these medications   aspirin EC 81 MG tablet Take 1 tablet (81 mg total) by mouth daily.   levothyroxine 88 MCG tablet Commonly known as:  SYNTHROID, LEVOTHROID Take 88 mcg by mouth daily.   LORazepam 0.5 MG tablet Commonly known as:  ATIVAN Take 0.5-1 tablets by mouth 2 (two) times daily as needed for anxiety or sleep.   multivitamin capsule Take 1 capsule by mouth daily.   omeprazole 20 MG capsule Commonly known as:  PRILOSEC Take 20 mg by mouth daily as needed (for acid reflux).   sertraline 100 MG tablet Commonly known as:  ZOLOFT Take 100 mg by mouth daily.      Follow-up Information    Robin Perches, MD. Schedule an appointment as soon as possible for a visit in 1 week(s).   Specialty:  Internal Medicine Why:  Hospital Follow Up  Contact information: [address] [place] [address] [phone]          Allergies  Allergen Reactions  . Hydrocodone Nausea And Vomiting  . Codeine Nausea And Vomiting   Allergies as of 12/05/2017      Reactions   Hydrocodone Nausea And Vomiting   Codeine Nausea And Vomiting      Medication List    STOP taking these medications   doxycycline 100 MG capsule Commonly known as:  VIBRAMYCIN   losartan 100 MG tablet Commonly known as:  COZAAR     TAKE these medications   aspirin EC 81 MG tablet Take 1 tablet (81 mg total) by mouth daily.   levothyroxine 88 MCG tablet Commonly known as:  SYNTHROID, LEVOTHROID Take 88 mcg by mouth daily.   LORazepam 0.5 MG tablet Commonly known as:  ATIVAN Take 0.5-1 tablets by mouth 2 (two) times  daily as needed for anxiety or sleep.   multivitamin capsule Take 1 capsule by mouth daily.   omeprazole 20 MG capsule Commonly known as:  PRILOSEC Take 20 mg by mouth daily as needed (for acid reflux).   sertraline 100 MG tablet Commonly known as:  ZOLOFT Take 100 mg by mouth daily.       Procedures/Studies: Dg Chest 2 View  Result Date: 12/02/2017 CLINICAL DATA:  Shortness of breath. EXAM: CHEST  2 VIEW COMPARISON:  Radiograph of July 19, 2017. FINDINGS: The heart size and mediastinal contours are within normal limits. Both lungs are clear. Atherosclerosis of thoracic aorta is noted. No pneumothorax or pleural effusion is noted. The visualized skeletal structures are unremarkable. IMPRESSION: No active  cardiopulmonary disease. Aortic Atherosclerosis (ICD10-I70.0). Electronically Signed   By: Lupita Raider, M.D.   On: 12/02/2017 17:54   Ct Head Wo Contrast  Result Date: 12/02/2017 CLINICAL DATA:  Status post fall 2 days ago with hematoma of left face. EXAM: CT HEAD WITHOUT CONTRAST CT CERVICAL SPINE WITHOUT CONTRAST TECHNIQUE: Multidetector CT imaging of the head and cervical spine was performed following the standard protocol without intravenous contrast. Multiplanar CT image reconstructions of the cervical spine were also generated. COMPARISON:  None. FINDINGS: CT HEAD FINDINGS Brain: No evidence of acute infarction, hemorrhage, hydrocephalus, extra-axial collection or mass lesion/mass effect. There is chronic diffuse atrophy. Chronic bilateral periventricular white matter small vessel ischemic changes identified. 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 Vascular: No hyperdense vessel or unexpected calcification. Skull: Normal. Negative for fracture or focal lesion. Sinuses/Orbits: Minimal periosteal thickening of the left maxillary sinus is identified. The orbits are normal. Other: None. CT CERVICAL SPINE FINDINGS Alignment: Normal. Skull base and vertebrae: No acute fracture.  No primary bone lesion or focal pathologic process. Soft tissues and spinal canal: No prevertebral fluid or swelling. No visible canal hematoma. Disc levels: Mild degenerative joint changes are identified in the upper and mid cervical spine with narrowed joint space and osteophyte formation. Upper chest: Negative. Other: Low-density nodule is identified in the left thyroid gland. IMPRESSION: No focal acute intracranial abnormality identified. Chronic diffuse atrophy. Chronic bilateral periventricular white matter small vessel ischemic change. No acute fracture or dislocation of cervical spine. Degenerative joint changes of cervical spine. Electronically Signed   By: Sherian Rein M.D.   On: 12/02/2017 18:07   Ct Cervical Spine Wo Contrast  Result Date: 12/02/2017 CLINICAL DATA:  Status post fall 2 days ago with hematoma of left face. EXAM: CT HEAD WITHOUT CONTRAST CT CERVICAL SPINE WITHOUT CONTRAST TECHNIQUE: Multidetector CT imaging of the head and cervical spine was performed following the standard protocol without intravenous contrast. Multiplanar CT image reconstructions of the cervical spine were also generated. COMPARISON:  None. FINDINGS: CT HEAD FINDINGS Brain: No evidence of acute infarction, hemorrhage, hydrocephalus, extra-axial collection or mass lesion/mass effect. There is chronic diffuse atrophy. Chronic bilateral periventricular white matter small vessel ischemic changes identified. 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 Vascular: No hyperdense vessel or unexpected calcification. Skull: Normal. Negative for fracture or focal lesion. Sinuses/Orbits: Minimal periosteal thickening of the left maxillary sinus is identified. The orbits are normal. Other: None. CT CERVICAL SPINE FINDINGS Alignment: Normal. Skull base and vertebrae: No acute fracture. No primary bone lesion or focal pathologic process. Soft tissues and spinal canal: No prevertebral fluid or swelling. No visible canal hematoma.  Disc levels: Mild degenerative joint changes are identified in the upper and mid cervical spine with narrowed joint space and osteophyte formation. Upper chest: Negative. Other: Low-density nodule is identified in the left thyroid gland. IMPRESSION: No focal acute intracranial abnormality identified. Chronic diffuse atrophy. Chronic bilateral periventricular white matter small vessel ischemic change. No acute fracture or dislocation of cervical spine. Degenerative joint changes of cervical spine. Electronically Signed   By: Sherian Rein M.D.   On: 12/02/2017 18:07   US Renal  Result Date: 12/03/2017 CLINICAL DATA:  Acute renal failure. EXAM: RENAL / URINARY TRACT ULTRASOUND COMPLETE COMPARISON:  06/28/2017 FINDINGS: Right Kidney: Length: 8.9 cm. Increased parenchymal echogenicity. Mild diffuse renal cortical thinning. Exophytic cyst projects from the lower pole measuring 3.2 x 3.0 x 3.3 cm. Small cortical cyst arises from the upper pole measuring 9 mm.  No other masses, no stones and no hydronephrosis. Left Kidney: Length: 8.3 cm. Increased parenchymal echogenicity. Diffuse renal cortical thinning. Elongated cystic lesion arises from the upper pole measuring 16 x 6 x 11 mm, unchanged from the prior study. No other renal masses, no stones and no hydronephrosis. Bladder: Appears normal for degree of bladder distention. IMPRESSION: 1. No acute findings.  No hydronephrosis. 2. Increased renal parenchymal echogenicity and decreased renal size is consistent with medical renal disease, similar to the prior study. 3. Two right renal cysts. Elongated presumed cyst arises from the upper pole the left kidney. No other abnormalities. Electronically Signed   By: Amie Portlandavid  Ormond M.D.   On: 12/03/2017 09:48      Subjective: Pt without complaints today.  Says she feels ready to go home.  She is urinating.    Discharge Exam: Vitals:   12/04/17 2130 12/05/17 0512  BP: (!) 120/52 109/60  Pulse: 77 75  Resp: (!) 24 20   Temp: 97.6 F (36.4 C) 97.9 F (36.6 C)  SpO2: 100% 97%   Vitals:   12/04/17 0458 12/04/17 1300 12/04/17 2130 12/05/17 0512  BP: 117/62 115/60 (!) 120/52 109/60  Pulse: 76 78 77 75  Resp:   (!) 24 20  Temp: 98.1 F (36.7 C) 98.3 F (36.8 C) 97.6 F (36.4 C) 97.9 F (36.6 C)  TempSrc: Oral Oral Oral Oral  SpO2: 100% 99% 100% 97%  Weight: 53.3 kg (117 lb 8.1 oz)   51.7 kg (113 lb 15.7 oz)  Height:        General: Pt is alert, awake, not in acute distress Cardiovascular: RRR, S1/S2 +, no rubs, no gallops Respiratory: CTA bilaterally, no wheezing, no rhonchi Abdominal: Soft, NT, ND, bowel sounds + Extremities: no edema, no cyanosis   The results of significant diagnostics from this hospitalization (including imaging, microbiology, ancillary and laboratory) are listed below for reference.     Microbiology: No results found for this or any previous visit (from the past 240 hour(s)).   Labs: BNP (last 3 results) No results for input(s): BNP in the last 8760 hours. Basic Metabolic Panel: Recent Labs  Lab 12/02/17 1633 12/03/17 0715 12/04/17 0416 12/05/17 0705  NA 137 137 139 137  K 4.5 4.5 4.1 3.4*  CL 109 111 106 100*  CO2 15* 16* 23 25  GLUCOSE 93 88 94 98  BUN 58* 51* 36* 22*  CREATININE 2.07* 1.87* 1.43* 1.34*  CALCIUM 10.2 9.4 8.9 8.8*  PHOS  --   --   --  3.4   Liver Function Tests: Recent Labs  Lab 12/02/17 1633 12/03/17 0715 12/05/17 0705  AST 26 21  --   ALT 18 15  --   ALKPHOS 80 69  --   BILITOT 0.9 0.6  --   PROT 7.1 6.2*  --   ALBUMIN 3.5 3.1* 2.8*   Recent Labs  Lab 12/02/17 1644  LIPASE 40   No results for input(s): AMMONIA in the last 168 hours. CBC: Recent Labs  Lab 12/02/17 1633 12/03/17 0715 12/04/17 0416  WBC 6.2 5.4 5.1  NEUTROABS 4.1  --   --   HGB 12.0 11.5* 10.7*  HCT 36.4 34.8* 32.8*  MCV 91.0 92.3 92.9  PLT 237 197 189   Cardiac Enzymes: No results for input(s): CKTOTAL, CKMB, CKMBINDEX, TROPONINI in the last  168 hours. BNP: Invalid input(s): POCBNP CBG: No results for input(s): GLUCAP in the last 168 hours. D-Dimer No results for input(s): DDIMER in  the last 72 hours. Hgb A1c No results for input(s): HGBA1C in the last 72 hours. Lipid Profile No results for input(s): CHOL, HDL, LDLCALC, TRIG, CHOLHDL, LDLDIRECT in the last 72 hours. Thyroid function studies Recent Labs    12/04/17 0416  TSH 0.434   Anemia work up No results for input(s): VITAMINB12, FOLATE, FERRITIN, TIBC, IRON, RETICCTPCT in the last 72 hours. Urinalysis    Component Value Date/Time   COLORURINE YELLOW 12/04/2017 2115   APPEARANCEUR HAZY (A) 12/04/2017 2115   LABSPEC 1.015 12/04/2017 2115   PHURINE 8.0 12/04/2017 2115   GLUCOSEU NEGATIVE 12/04/2017 2115   HGBUR NEGATIVE 12/04/2017 2115   BILIRUBINUR NEGATIVE 12/04/2017 2115   KETONESUR NEGATIVE 12/04/2017 2115   PROTEINUR NEGATIVE 12/04/2017 2115   NITRITE NEGATIVE 12/04/2017 2115   LEUKOCYTESUR LARGE (A) 12/04/2017 2115   Sepsis Labs Invalid input(s): PROCALCITONIN,  WBC,  LACTICIDVEN Microbiology No results found for this or any previous visit (from the past 240 hour(s)).  Time coordinating discharge:  36 minutes  SIGNED:  Standley Dakins, MD  [facility] 12/05/2017, 1:47 PM Pager [phone]  If 7PM-7AM, please contact night-coverage [email or web address] Password [email or web address]

## 2017-12-05 NOTE — Care Management Important Message (Signed)
Important Message  Patient Details  Name: Vivien Prestoudrey K Evans MRN: [id number] Date of Birth: 07/09/1931   Medicare Important Message Given:  Yes    Malcolm Metrohildress, Ramel Tobon Demske, RN 12/05/2017, 11:49 AM

## 2017-12-05 NOTE — Progress Notes (Signed)
Physical Therapy Treatment Patient Details Name: Robin Grant MRN: [id number] DOB: 1931/06/05 Today's Date: 12/05/2017    History of Present Illness Robin Grant  is a 84 y.o. female  w hypertension, hyperlipidemia, hypothyroidsm, presents with feeling unwell for the past 1 week,  tx with doxycycline and had poor po intake. Pt felt weak and general malaise and therefore presented to Dr. Karleen Hampshire office and he sent her to ED for dehydration.      PT Comments    Pt supine in bed with family in room, friendly and willing to participate with therapy today.  Pt independent with bed mobility and supervision with transfers today, min cueing for hand placement to assist with safety.  Increased distance with gait training with only fatigue, no reports of pain.  Pt continues to veer to the Rt during gait, no obstacles touched this session.  Pt left in chair with family in room.     Follow Up Recommendations  SNF;Supervision/Assistance - 24 hour     Equipment Recommendations  None recommended by PT    Recommendations for Other Services       Precautions / Restrictions Precautions Precautions: Fall Restrictions Weight Bearing Restrictions: No    Mobility  Bed Mobility Overal bed mobility: Independent Bed Mobility: Supine to Sit     Supine to sit: Independent     General bed mobility comments: pt indepdendent bed mobility  Transfers Overall transfer level: Modified independent Equipment used: Rolling walker (2 wheeled) Transfers: Sit to/from Stand Sit to Stand: Supervision(Cueing for hand placement)            Ambulation/Gait Ambulation/Gait assistance: Min assist Ambulation Distance (Feet): 152 Feet Assistive device: Rolling walker (2 wheeled) Gait Pattern/deviations: Decreased step length - right;Decreased step length - left;Decreased stride length     General Gait Details: Cadence WNL; continues to veer to the right during gait.  No LOB no obstacles touched today.  was  limited by fatigue    Stairs            Wheelchair Mobility    Modified Rankin (Stroke Patients Only)       Balance                                            Cognition Arousal/Alertness: Awake/alert Behavior During Therapy: WFL for tasks assessed/performed Overall Cognitive Status: Within Functional Limits for tasks assessed                                        Exercises      General Comments        Pertinent Vitals/Pain Pain Assessment: No/denies pain    Home Living                      Prior Function            PT Goals (current goals can now be found in the care plan section)      Frequency    Min 3X/week      PT Plan      Co-evaluation              AM-PAC PT "6 Clicks" Daily Activity  Outcome Measure  Difficulty turning over in bed (including adjusting bedclothes, sheets and blankets)?: A  Little Difficulty moving from lying on back to sitting on the side of the bed? : A Little Difficulty sitting down on and standing up from a chair with arms (e.g., wheelchair, bedside commode, etc,.)?: A Lot Help needed moving to and from a bed to chair (including a wheelchair)?: A Lot Help needed walking in hospital room?: A Lot Help needed climbing 3-5 steps with a railing? : A Lot 6 Click Score: 14    End of Session Equipment Utilized During Treatment: Gait belt Activity Tolerance: Patient tolerated treatment well;Patient limited by fatigue Patient left: in chair;with call bell/phone within reach;with chair alarm set;with family/visitor present(son and daugther in law in room) Nurse Communication: Mobility status PT Visit Diagnosis: Unsteadiness on feet (R26.81);Other abnormalities of gait and mobility (R26.89);Muscle weakness (generalized) (M62.81)     Time: [id number] PT Time Calculation (min) (ACUTE ONLY): 18 min  Charges:  $Therapeutic Activity: 8-22 mins                    G Codes:       Becky SaxCasey Cockerham, LPTA; CBIS [phone]  Juel BurrowCockerham, Casey Jo 12/05/2017, 12:36 PM

## 2017-12-06 DIAGNOSIS — N183 Chronic kidney disease, stage 3 (moderate): Secondary | ICD-10-CM | POA: Diagnosis not present

## 2017-12-06 DIAGNOSIS — E785 Hyperlipidemia, unspecified: Secondary | ICD-10-CM | POA: Diagnosis not present

## 2017-12-06 DIAGNOSIS — N179 Acute kidney failure, unspecified: Secondary | ICD-10-CM | POA: Diagnosis not present

## 2017-12-06 DIAGNOSIS — F419 Anxiety disorder, unspecified: Secondary | ICD-10-CM | POA: Diagnosis not present

## 2017-12-06 DIAGNOSIS — E039 Hypothyroidism, unspecified: Secondary | ICD-10-CM | POA: Diagnosis not present

## 2017-12-06 DIAGNOSIS — G4700 Insomnia, unspecified: Secondary | ICD-10-CM | POA: Diagnosis not present

## 2017-12-06 DIAGNOSIS — I129 Hypertensive chronic kidney disease with stage 1 through stage 4 chronic kidney disease, or unspecified chronic kidney disease: Secondary | ICD-10-CM | POA: Diagnosis not present

## 2017-12-06 DIAGNOSIS — K5900 Constipation, unspecified: Secondary | ICD-10-CM | POA: Diagnosis not present

## 2017-12-06 DIAGNOSIS — R339 Retention of urine, unspecified: Secondary | ICD-10-CM | POA: Diagnosis not present

## 2017-12-06 LAB — CALCIUM / CREATININE RATIO, URINE
CALCIUM/CREAT. RATIO: 64 mg/g{creat} (ref 0–260)
Calcium, Ur: 4.3 mg/dL
Creatinine, Urine: 67.4 mg/dL

## 2017-12-09 DIAGNOSIS — N183 Chronic kidney disease, stage 3 (moderate): Secondary | ICD-10-CM | POA: Diagnosis not present

## 2017-12-09 DIAGNOSIS — I129 Hypertensive chronic kidney disease with stage 1 through stage 4 chronic kidney disease, or unspecified chronic kidney disease: Secondary | ICD-10-CM | POA: Diagnosis not present

## 2017-12-09 DIAGNOSIS — E039 Hypothyroidism, unspecified: Secondary | ICD-10-CM | POA: Diagnosis not present

## 2017-12-09 DIAGNOSIS — N179 Acute kidney failure, unspecified: Secondary | ICD-10-CM | POA: Diagnosis not present

## 2017-12-09 DIAGNOSIS — F419 Anxiety disorder, unspecified: Secondary | ICD-10-CM | POA: Diagnosis not present

## 2017-12-09 DIAGNOSIS — E785 Hyperlipidemia, unspecified: Secondary | ICD-10-CM | POA: Diagnosis not present

## 2017-12-09 DIAGNOSIS — K5900 Constipation, unspecified: Secondary | ICD-10-CM | POA: Diagnosis not present

## 2017-12-09 DIAGNOSIS — R339 Retention of urine, unspecified: Secondary | ICD-10-CM | POA: Diagnosis not present

## 2017-12-09 DIAGNOSIS — G4700 Insomnia, unspecified: Secondary | ICD-10-CM | POA: Diagnosis not present

## 2017-12-11 DIAGNOSIS — I129 Hypertensive chronic kidney disease with stage 1 through stage 4 chronic kidney disease, or unspecified chronic kidney disease: Secondary | ICD-10-CM | POA: Diagnosis not present

## 2017-12-11 DIAGNOSIS — N179 Acute kidney failure, unspecified: Secondary | ICD-10-CM | POA: Diagnosis not present

## 2017-12-11 DIAGNOSIS — E785 Hyperlipidemia, unspecified: Secondary | ICD-10-CM | POA: Diagnosis not present

## 2017-12-11 DIAGNOSIS — F419 Anxiety disorder, unspecified: Secondary | ICD-10-CM | POA: Diagnosis not present

## 2017-12-11 DIAGNOSIS — R339 Retention of urine, unspecified: Secondary | ICD-10-CM | POA: Diagnosis not present

## 2017-12-11 DIAGNOSIS — K5900 Constipation, unspecified: Secondary | ICD-10-CM | POA: Diagnosis not present

## 2017-12-11 DIAGNOSIS — E039 Hypothyroidism, unspecified: Secondary | ICD-10-CM | POA: Diagnosis not present

## 2017-12-11 DIAGNOSIS — N183 Chronic kidney disease, stage 3 (moderate): Secondary | ICD-10-CM | POA: Diagnosis not present

## 2017-12-11 DIAGNOSIS — G4700 Insomnia, unspecified: Secondary | ICD-10-CM | POA: Diagnosis not present

## 2017-12-12 ENCOUNTER — Other Ambulatory Visit: Payer: Self-pay

## 2017-12-12 DIAGNOSIS — N179 Acute kidney failure, unspecified: Secondary | ICD-10-CM | POA: Diagnosis not present

## 2017-12-12 DIAGNOSIS — S37091A Other injury of right kidney, initial encounter: Secondary | ICD-10-CM | POA: Diagnosis not present

## 2017-12-12 DIAGNOSIS — R339 Retention of urine, unspecified: Secondary | ICD-10-CM | POA: Diagnosis not present

## 2017-12-12 DIAGNOSIS — I10 Essential (primary) hypertension: Secondary | ICD-10-CM | POA: Diagnosis not present

## 2017-12-12 NOTE — Patient Outreach (Signed)
[facility] Care Management  12/12/2017  Vivien Prestoudrey K Fiveash May 21, 1931 [id number]     EMMI-General Discharge RED ON EMMI ALERT Day # 4 Date: 12/11/17 Red Alert Reason: " Got discharge papers? No"   Outreach attempt #1 to patient. Spoke with patient. Reviewed and addressed red alert with patient. Patient does not recall receiving automated robo call and answering questions. She was able to voice that she does still have her discharge paperwork. Son and dtr in law are assisting and helping patient manage care. She has PCP appt scheduled for today and son will be taking her. Patient denies any issues regarding meds. She does states that she fell yesterday evening while trying to get up from the chair. RN CM reviewed safety measures with patient. [facility] and voices the nurse was out to see her yesterday. Patient instructed to make sure she discusses falls with PCP at appt. Noted in chart hospital staff recommended SNF but patient declined.  She voices no further RN CM needs or concerns at this time. Patient has completed EMMI-General Discharge automated calls.        Plan: RN CM will notify [facility] administrative assistant of case status.    Antionette Fairyoshanda Betzabeth Derringer, RN,BSN,CCM [facility] Care Management Telephonic Care Management Coordinator Direct Phone: [phone] Toll Free: [phone] Fax: [phone]

## 2017-12-16 DIAGNOSIS — F419 Anxiety disorder, unspecified: Secondary | ICD-10-CM | POA: Diagnosis not present

## 2017-12-16 DIAGNOSIS — E785 Hyperlipidemia, unspecified: Secondary | ICD-10-CM | POA: Diagnosis not present

## 2017-12-16 DIAGNOSIS — N179 Acute kidney failure, unspecified: Secondary | ICD-10-CM | POA: Diagnosis not present

## 2017-12-16 DIAGNOSIS — K5900 Constipation, unspecified: Secondary | ICD-10-CM | POA: Diagnosis not present

## 2017-12-16 DIAGNOSIS — I129 Hypertensive chronic kidney disease with stage 1 through stage 4 chronic kidney disease, or unspecified chronic kidney disease: Secondary | ICD-10-CM | POA: Diagnosis not present

## 2017-12-16 DIAGNOSIS — R339 Retention of urine, unspecified: Secondary | ICD-10-CM | POA: Diagnosis not present

## 2017-12-16 DIAGNOSIS — G4700 Insomnia, unspecified: Secondary | ICD-10-CM | POA: Diagnosis not present

## 2017-12-16 DIAGNOSIS — N183 Chronic kidney disease, stage 3 (moderate): Secondary | ICD-10-CM | POA: Diagnosis not present

## 2017-12-16 DIAGNOSIS — E039 Hypothyroidism, unspecified: Secondary | ICD-10-CM | POA: Diagnosis not present

## 2017-12-17 DIAGNOSIS — E039 Hypothyroidism, unspecified: Secondary | ICD-10-CM | POA: Diagnosis not present

## 2017-12-17 DIAGNOSIS — K5900 Constipation, unspecified: Secondary | ICD-10-CM | POA: Diagnosis not present

## 2017-12-17 DIAGNOSIS — N183 Chronic kidney disease, stage 3 (moderate): Secondary | ICD-10-CM | POA: Diagnosis not present

## 2017-12-17 DIAGNOSIS — F419 Anxiety disorder, unspecified: Secondary | ICD-10-CM | POA: Diagnosis not present

## 2017-12-17 DIAGNOSIS — I129 Hypertensive chronic kidney disease with stage 1 through stage 4 chronic kidney disease, or unspecified chronic kidney disease: Secondary | ICD-10-CM | POA: Diagnosis not present

## 2017-12-17 DIAGNOSIS — G4700 Insomnia, unspecified: Secondary | ICD-10-CM | POA: Diagnosis not present

## 2017-12-17 DIAGNOSIS — N179 Acute kidney failure, unspecified: Secondary | ICD-10-CM | POA: Diagnosis not present

## 2017-12-17 DIAGNOSIS — E785 Hyperlipidemia, unspecified: Secondary | ICD-10-CM | POA: Diagnosis not present

## 2017-12-17 DIAGNOSIS — R339 Retention of urine, unspecified: Secondary | ICD-10-CM | POA: Diagnosis not present

## 2017-12-19 DIAGNOSIS — K5900 Constipation, unspecified: Secondary | ICD-10-CM | POA: Diagnosis not present

## 2017-12-19 DIAGNOSIS — R339 Retention of urine, unspecified: Secondary | ICD-10-CM | POA: Diagnosis not present

## 2017-12-19 DIAGNOSIS — F419 Anxiety disorder, unspecified: Secondary | ICD-10-CM | POA: Diagnosis not present

## 2017-12-19 DIAGNOSIS — G4700 Insomnia, unspecified: Secondary | ICD-10-CM | POA: Diagnosis not present

## 2017-12-19 DIAGNOSIS — E785 Hyperlipidemia, unspecified: Secondary | ICD-10-CM | POA: Diagnosis not present

## 2017-12-19 DIAGNOSIS — E039 Hypothyroidism, unspecified: Secondary | ICD-10-CM | POA: Diagnosis not present

## 2017-12-19 DIAGNOSIS — N179 Acute kidney failure, unspecified: Secondary | ICD-10-CM | POA: Diagnosis not present

## 2017-12-19 DIAGNOSIS — N183 Chronic kidney disease, stage 3 (moderate): Secondary | ICD-10-CM | POA: Diagnosis not present

## 2017-12-19 DIAGNOSIS — I129 Hypertensive chronic kidney disease with stage 1 through stage 4 chronic kidney disease, or unspecified chronic kidney disease: Secondary | ICD-10-CM | POA: Diagnosis not present

## 2017-12-24 DIAGNOSIS — I129 Hypertensive chronic kidney disease with stage 1 through stage 4 chronic kidney disease, or unspecified chronic kidney disease: Secondary | ICD-10-CM | POA: Diagnosis not present

## 2017-12-24 DIAGNOSIS — E039 Hypothyroidism, unspecified: Secondary | ICD-10-CM | POA: Diagnosis not present

## 2017-12-24 DIAGNOSIS — N179 Acute kidney failure, unspecified: Secondary | ICD-10-CM | POA: Diagnosis not present

## 2017-12-24 DIAGNOSIS — G4700 Insomnia, unspecified: Secondary | ICD-10-CM | POA: Diagnosis not present

## 2017-12-24 DIAGNOSIS — F419 Anxiety disorder, unspecified: Secondary | ICD-10-CM | POA: Diagnosis not present

## 2017-12-24 DIAGNOSIS — R339 Retention of urine, unspecified: Secondary | ICD-10-CM | POA: Diagnosis not present

## 2017-12-24 DIAGNOSIS — N183 Chronic kidney disease, stage 3 (moderate): Secondary | ICD-10-CM | POA: Diagnosis not present

## 2017-12-24 DIAGNOSIS — E785 Hyperlipidemia, unspecified: Secondary | ICD-10-CM | POA: Diagnosis not present

## 2017-12-24 DIAGNOSIS — K5900 Constipation, unspecified: Secondary | ICD-10-CM | POA: Diagnosis not present

## 2017-12-27 DIAGNOSIS — G4700 Insomnia, unspecified: Secondary | ICD-10-CM | POA: Diagnosis not present

## 2017-12-27 DIAGNOSIS — E039 Hypothyroidism, unspecified: Secondary | ICD-10-CM | POA: Diagnosis not present

## 2017-12-27 DIAGNOSIS — E785 Hyperlipidemia, unspecified: Secondary | ICD-10-CM | POA: Diagnosis not present

## 2017-12-27 DIAGNOSIS — N179 Acute kidney failure, unspecified: Secondary | ICD-10-CM | POA: Diagnosis not present

## 2017-12-27 DIAGNOSIS — F419 Anxiety disorder, unspecified: Secondary | ICD-10-CM | POA: Diagnosis not present

## 2017-12-27 DIAGNOSIS — N183 Chronic kidney disease, stage 3 (moderate): Secondary | ICD-10-CM | POA: Diagnosis not present

## 2017-12-27 DIAGNOSIS — R339 Retention of urine, unspecified: Secondary | ICD-10-CM | POA: Diagnosis not present

## 2017-12-27 DIAGNOSIS — K5900 Constipation, unspecified: Secondary | ICD-10-CM | POA: Diagnosis not present

## 2017-12-27 DIAGNOSIS — I129 Hypertensive chronic kidney disease with stage 1 through stage 4 chronic kidney disease, or unspecified chronic kidney disease: Secondary | ICD-10-CM | POA: Diagnosis not present

## 2017-12-31 DIAGNOSIS — G4700 Insomnia, unspecified: Secondary | ICD-10-CM | POA: Diagnosis not present

## 2017-12-31 DIAGNOSIS — K5900 Constipation, unspecified: Secondary | ICD-10-CM | POA: Diagnosis not present

## 2017-12-31 DIAGNOSIS — N183 Chronic kidney disease, stage 3 (moderate): Secondary | ICD-10-CM | POA: Diagnosis not present

## 2017-12-31 DIAGNOSIS — I129 Hypertensive chronic kidney disease with stage 1 through stage 4 chronic kidney disease, or unspecified chronic kidney disease: Secondary | ICD-10-CM | POA: Diagnosis not present

## 2017-12-31 DIAGNOSIS — E039 Hypothyroidism, unspecified: Secondary | ICD-10-CM | POA: Diagnosis not present

## 2017-12-31 DIAGNOSIS — F419 Anxiety disorder, unspecified: Secondary | ICD-10-CM | POA: Diagnosis not present

## 2017-12-31 DIAGNOSIS — N179 Acute kidney failure, unspecified: Secondary | ICD-10-CM | POA: Diagnosis not present

## 2017-12-31 DIAGNOSIS — E785 Hyperlipidemia, unspecified: Secondary | ICD-10-CM | POA: Diagnosis not present

## 2017-12-31 DIAGNOSIS — R339 Retention of urine, unspecified: Secondary | ICD-10-CM | POA: Diagnosis not present

## 2018-01-01 DIAGNOSIS — K5900 Constipation, unspecified: Secondary | ICD-10-CM | POA: Diagnosis not present

## 2018-01-01 DIAGNOSIS — N183 Chronic kidney disease, stage 3 (moderate): Secondary | ICD-10-CM | POA: Diagnosis not present

## 2018-01-01 DIAGNOSIS — I129 Hypertensive chronic kidney disease with stage 1 through stage 4 chronic kidney disease, or unspecified chronic kidney disease: Secondary | ICD-10-CM | POA: Diagnosis not present

## 2018-01-01 DIAGNOSIS — E039 Hypothyroidism, unspecified: Secondary | ICD-10-CM | POA: Diagnosis not present

## 2018-01-01 DIAGNOSIS — E785 Hyperlipidemia, unspecified: Secondary | ICD-10-CM | POA: Diagnosis not present

## 2018-01-01 DIAGNOSIS — N179 Acute kidney failure, unspecified: Secondary | ICD-10-CM | POA: Diagnosis not present

## 2018-01-01 DIAGNOSIS — G4700 Insomnia, unspecified: Secondary | ICD-10-CM | POA: Diagnosis not present

## 2018-01-01 DIAGNOSIS — F419 Anxiety disorder, unspecified: Secondary | ICD-10-CM | POA: Diagnosis not present

## 2018-01-01 DIAGNOSIS — R339 Retention of urine, unspecified: Secondary | ICD-10-CM | POA: Diagnosis not present

## 2018-01-03 DIAGNOSIS — F419 Anxiety disorder, unspecified: Secondary | ICD-10-CM | POA: Diagnosis not present

## 2018-01-03 DIAGNOSIS — N179 Acute kidney failure, unspecified: Secondary | ICD-10-CM | POA: Diagnosis not present

## 2018-01-03 DIAGNOSIS — N183 Chronic kidney disease, stage 3 (moderate): Secondary | ICD-10-CM | POA: Diagnosis not present

## 2018-01-03 DIAGNOSIS — N390 Urinary tract infection, site not specified: Secondary | ICD-10-CM | POA: Diagnosis not present

## 2018-01-03 DIAGNOSIS — E785 Hyperlipidemia, unspecified: Secondary | ICD-10-CM | POA: Diagnosis not present

## 2018-01-03 DIAGNOSIS — I129 Hypertensive chronic kidney disease with stage 1 through stage 4 chronic kidney disease, or unspecified chronic kidney disease: Secondary | ICD-10-CM | POA: Diagnosis not present

## 2018-01-03 DIAGNOSIS — K5900 Constipation, unspecified: Secondary | ICD-10-CM | POA: Diagnosis not present

## 2018-01-03 DIAGNOSIS — E039 Hypothyroidism, unspecified: Secondary | ICD-10-CM | POA: Diagnosis not present

## 2018-01-03 DIAGNOSIS — G4700 Insomnia, unspecified: Secondary | ICD-10-CM | POA: Diagnosis not present

## 2018-01-03 DIAGNOSIS — R339 Retention of urine, unspecified: Secondary | ICD-10-CM | POA: Diagnosis not present

## 2018-01-06 DIAGNOSIS — E785 Hyperlipidemia, unspecified: Secondary | ICD-10-CM | POA: Diagnosis not present

## 2018-01-06 DIAGNOSIS — G4700 Insomnia, unspecified: Secondary | ICD-10-CM | POA: Diagnosis not present

## 2018-01-06 DIAGNOSIS — N179 Acute kidney failure, unspecified: Secondary | ICD-10-CM | POA: Diagnosis not present

## 2018-01-06 DIAGNOSIS — N390 Urinary tract infection, site not specified: Secondary | ICD-10-CM | POA: Diagnosis not present

## 2018-01-06 DIAGNOSIS — F419 Anxiety disorder, unspecified: Secondary | ICD-10-CM | POA: Diagnosis not present

## 2018-01-06 DIAGNOSIS — K5900 Constipation, unspecified: Secondary | ICD-10-CM | POA: Diagnosis not present

## 2018-01-06 DIAGNOSIS — E039 Hypothyroidism, unspecified: Secondary | ICD-10-CM | POA: Diagnosis not present

## 2018-01-06 DIAGNOSIS — N183 Chronic kidney disease, stage 3 (moderate): Secondary | ICD-10-CM | POA: Diagnosis not present

## 2018-01-06 DIAGNOSIS — I129 Hypertensive chronic kidney disease with stage 1 through stage 4 chronic kidney disease, or unspecified chronic kidney disease: Secondary | ICD-10-CM | POA: Diagnosis not present

## 2018-01-06 DIAGNOSIS — R339 Retention of urine, unspecified: Secondary | ICD-10-CM | POA: Diagnosis not present

## 2018-01-08 DIAGNOSIS — I129 Hypertensive chronic kidney disease with stage 1 through stage 4 chronic kidney disease, or unspecified chronic kidney disease: Secondary | ICD-10-CM | POA: Diagnosis not present

## 2018-01-08 DIAGNOSIS — K5900 Constipation, unspecified: Secondary | ICD-10-CM | POA: Diagnosis not present

## 2018-01-08 DIAGNOSIS — R339 Retention of urine, unspecified: Secondary | ICD-10-CM | POA: Diagnosis not present

## 2018-01-08 DIAGNOSIS — G4700 Insomnia, unspecified: Secondary | ICD-10-CM | POA: Diagnosis not present

## 2018-01-08 DIAGNOSIS — F419 Anxiety disorder, unspecified: Secondary | ICD-10-CM | POA: Diagnosis not present

## 2018-01-08 DIAGNOSIS — N179 Acute kidney failure, unspecified: Secondary | ICD-10-CM | POA: Diagnosis not present

## 2018-01-08 DIAGNOSIS — E785 Hyperlipidemia, unspecified: Secondary | ICD-10-CM | POA: Diagnosis not present

## 2018-01-08 DIAGNOSIS — N183 Chronic kidney disease, stage 3 (moderate): Secondary | ICD-10-CM | POA: Diagnosis not present

## 2018-01-08 DIAGNOSIS — E039 Hypothyroidism, unspecified: Secondary | ICD-10-CM | POA: Diagnosis not present

## 2018-01-09 DIAGNOSIS — R413 Other amnesia: Secondary | ICD-10-CM | POA: Diagnosis not present

## 2018-01-09 DIAGNOSIS — E441 Mild protein-calorie malnutrition: Secondary | ICD-10-CM | POA: Diagnosis not present

## 2018-01-09 DIAGNOSIS — E46 Unspecified protein-calorie malnutrition: Secondary | ICD-10-CM | POA: Diagnosis not present

## 2018-01-09 DIAGNOSIS — N390 Urinary tract infection, site not specified: Secondary | ICD-10-CM | POA: Diagnosis not present

## 2018-01-09 DIAGNOSIS — B9620 Unspecified Escherichia coli [E. coli] as the cause of diseases classified elsewhere: Secondary | ICD-10-CM | POA: Diagnosis not present

## 2018-01-10 DIAGNOSIS — E785 Hyperlipidemia, unspecified: Secondary | ICD-10-CM | POA: Diagnosis not present

## 2018-01-10 DIAGNOSIS — E039 Hypothyroidism, unspecified: Secondary | ICD-10-CM | POA: Diagnosis not present

## 2018-01-10 DIAGNOSIS — K5900 Constipation, unspecified: Secondary | ICD-10-CM | POA: Diagnosis not present

## 2018-01-10 DIAGNOSIS — F419 Anxiety disorder, unspecified: Secondary | ICD-10-CM | POA: Diagnosis not present

## 2018-01-10 DIAGNOSIS — I129 Hypertensive chronic kidney disease with stage 1 through stage 4 chronic kidney disease, or unspecified chronic kidney disease: Secondary | ICD-10-CM | POA: Diagnosis not present

## 2018-01-10 DIAGNOSIS — N183 Chronic kidney disease, stage 3 (moderate): Secondary | ICD-10-CM | POA: Diagnosis not present

## 2018-01-10 DIAGNOSIS — G4700 Insomnia, unspecified: Secondary | ICD-10-CM | POA: Diagnosis not present

## 2018-01-10 DIAGNOSIS — R339 Retention of urine, unspecified: Secondary | ICD-10-CM | POA: Diagnosis not present

## 2018-01-10 DIAGNOSIS — N179 Acute kidney failure, unspecified: Secondary | ICD-10-CM | POA: Diagnosis not present

## 2018-01-14 DIAGNOSIS — R339 Retention of urine, unspecified: Secondary | ICD-10-CM | POA: Diagnosis not present

## 2018-01-14 DIAGNOSIS — E785 Hyperlipidemia, unspecified: Secondary | ICD-10-CM | POA: Diagnosis not present

## 2018-01-14 DIAGNOSIS — I129 Hypertensive chronic kidney disease with stage 1 through stage 4 chronic kidney disease, or unspecified chronic kidney disease: Secondary | ICD-10-CM | POA: Diagnosis not present

## 2018-01-14 DIAGNOSIS — N183 Chronic kidney disease, stage 3 (moderate): Secondary | ICD-10-CM | POA: Diagnosis not present

## 2018-01-14 DIAGNOSIS — F419 Anxiety disorder, unspecified: Secondary | ICD-10-CM | POA: Diagnosis not present

## 2018-01-14 DIAGNOSIS — G4700 Insomnia, unspecified: Secondary | ICD-10-CM | POA: Diagnosis not present

## 2018-01-14 DIAGNOSIS — E039 Hypothyroidism, unspecified: Secondary | ICD-10-CM | POA: Diagnosis not present

## 2018-01-14 DIAGNOSIS — N179 Acute kidney failure, unspecified: Secondary | ICD-10-CM | POA: Diagnosis not present

## 2018-01-14 DIAGNOSIS — K5900 Constipation, unspecified: Secondary | ICD-10-CM | POA: Diagnosis not present

## 2018-01-16 DIAGNOSIS — F419 Anxiety disorder, unspecified: Secondary | ICD-10-CM | POA: Diagnosis not present

## 2018-01-16 DIAGNOSIS — I129 Hypertensive chronic kidney disease with stage 1 through stage 4 chronic kidney disease, or unspecified chronic kidney disease: Secondary | ICD-10-CM | POA: Diagnosis not present

## 2018-01-16 DIAGNOSIS — G4700 Insomnia, unspecified: Secondary | ICD-10-CM | POA: Diagnosis not present

## 2018-01-16 DIAGNOSIS — K5900 Constipation, unspecified: Secondary | ICD-10-CM | POA: Diagnosis not present

## 2018-01-16 DIAGNOSIS — N183 Chronic kidney disease, stage 3 (moderate): Secondary | ICD-10-CM | POA: Diagnosis not present

## 2018-01-16 DIAGNOSIS — E039 Hypothyroidism, unspecified: Secondary | ICD-10-CM | POA: Diagnosis not present

## 2018-01-16 DIAGNOSIS — E785 Hyperlipidemia, unspecified: Secondary | ICD-10-CM | POA: Diagnosis not present

## 2018-01-16 DIAGNOSIS — R339 Retention of urine, unspecified: Secondary | ICD-10-CM | POA: Diagnosis not present

## 2018-01-16 DIAGNOSIS — N179 Acute kidney failure, unspecified: Secondary | ICD-10-CM | POA: Diagnosis not present

## 2018-01-17 DIAGNOSIS — N183 Chronic kidney disease, stage 3 (moderate): Secondary | ICD-10-CM | POA: Diagnosis not present

## 2018-01-17 DIAGNOSIS — N179 Acute kidney failure, unspecified: Secondary | ICD-10-CM | POA: Diagnosis not present

## 2018-01-17 DIAGNOSIS — E039 Hypothyroidism, unspecified: Secondary | ICD-10-CM | POA: Diagnosis not present

## 2018-01-17 DIAGNOSIS — E785 Hyperlipidemia, unspecified: Secondary | ICD-10-CM | POA: Diagnosis not present

## 2018-01-17 DIAGNOSIS — F419 Anxiety disorder, unspecified: Secondary | ICD-10-CM | POA: Diagnosis not present

## 2018-01-17 DIAGNOSIS — K5900 Constipation, unspecified: Secondary | ICD-10-CM | POA: Diagnosis not present

## 2018-01-17 DIAGNOSIS — I129 Hypertensive chronic kidney disease with stage 1 through stage 4 chronic kidney disease, or unspecified chronic kidney disease: Secondary | ICD-10-CM | POA: Diagnosis not present

## 2018-01-17 DIAGNOSIS — G4700 Insomnia, unspecified: Secondary | ICD-10-CM | POA: Diagnosis not present

## 2018-01-17 DIAGNOSIS — R339 Retention of urine, unspecified: Secondary | ICD-10-CM | POA: Diagnosis not present

## 2018-01-21 DIAGNOSIS — E785 Hyperlipidemia, unspecified: Secondary | ICD-10-CM | POA: Diagnosis not present

## 2018-01-21 DIAGNOSIS — K5900 Constipation, unspecified: Secondary | ICD-10-CM | POA: Diagnosis not present

## 2018-01-21 DIAGNOSIS — G4700 Insomnia, unspecified: Secondary | ICD-10-CM | POA: Diagnosis not present

## 2018-01-21 DIAGNOSIS — N179 Acute kidney failure, unspecified: Secondary | ICD-10-CM | POA: Diagnosis not present

## 2018-01-21 DIAGNOSIS — R339 Retention of urine, unspecified: Secondary | ICD-10-CM | POA: Diagnosis not present

## 2018-01-21 DIAGNOSIS — N183 Chronic kidney disease, stage 3 (moderate): Secondary | ICD-10-CM | POA: Diagnosis not present

## 2018-01-21 DIAGNOSIS — I129 Hypertensive chronic kidney disease with stage 1 through stage 4 chronic kidney disease, or unspecified chronic kidney disease: Secondary | ICD-10-CM | POA: Diagnosis not present

## 2018-01-21 DIAGNOSIS — F419 Anxiety disorder, unspecified: Secondary | ICD-10-CM | POA: Diagnosis not present

## 2018-01-21 DIAGNOSIS — E039 Hypothyroidism, unspecified: Secondary | ICD-10-CM | POA: Diagnosis not present

## 2018-01-28 DIAGNOSIS — I129 Hypertensive chronic kidney disease with stage 1 through stage 4 chronic kidney disease, or unspecified chronic kidney disease: Secondary | ICD-10-CM | POA: Diagnosis not present

## 2018-01-28 DIAGNOSIS — R339 Retention of urine, unspecified: Secondary | ICD-10-CM | POA: Diagnosis not present

## 2018-01-28 DIAGNOSIS — K5900 Constipation, unspecified: Secondary | ICD-10-CM | POA: Diagnosis not present

## 2018-01-28 DIAGNOSIS — N183 Chronic kidney disease, stage 3 (moderate): Secondary | ICD-10-CM | POA: Diagnosis not present

## 2018-01-28 DIAGNOSIS — E039 Hypothyroidism, unspecified: Secondary | ICD-10-CM | POA: Diagnosis not present

## 2018-01-28 DIAGNOSIS — G4700 Insomnia, unspecified: Secondary | ICD-10-CM | POA: Diagnosis not present

## 2018-01-28 DIAGNOSIS — E785 Hyperlipidemia, unspecified: Secondary | ICD-10-CM | POA: Diagnosis not present

## 2018-01-28 DIAGNOSIS — F419 Anxiety disorder, unspecified: Secondary | ICD-10-CM | POA: Diagnosis not present

## 2018-01-28 DIAGNOSIS — N179 Acute kidney failure, unspecified: Secondary | ICD-10-CM | POA: Diagnosis not present

## 2018-02-03 DIAGNOSIS — Z79899 Other long term (current) drug therapy: Secondary | ICD-10-CM | POA: Diagnosis not present

## 2018-02-03 DIAGNOSIS — I10 Essential (primary) hypertension: Secondary | ICD-10-CM | POA: Diagnosis not present

## 2018-02-03 DIAGNOSIS — F0390 Unspecified dementia without behavioral disturbance: Secondary | ICD-10-CM | POA: Diagnosis not present

## 2018-02-03 DIAGNOSIS — E46 Unspecified protein-calorie malnutrition: Secondary | ICD-10-CM | POA: Diagnosis not present

## 2018-02-03 DIAGNOSIS — N182 Chronic kidney disease, stage 2 (mild): Secondary | ICD-10-CM | POA: Diagnosis not present

## 2018-02-06 DIAGNOSIS — R112 Nausea with vomiting, unspecified: Secondary | ICD-10-CM | POA: Diagnosis not present

## 2018-02-06 DIAGNOSIS — E46 Unspecified protein-calorie malnutrition: Secondary | ICD-10-CM | POA: Diagnosis not present

## 2018-02-06 DIAGNOSIS — E441 Mild protein-calorie malnutrition: Secondary | ICD-10-CM | POA: Diagnosis not present

## 2018-02-06 DIAGNOSIS — N183 Chronic kidney disease, stage 3 (moderate): Secondary | ICD-10-CM | POA: Diagnosis not present

## 2018-02-07 ENCOUNTER — Other Ambulatory Visit (HOSPITAL_COMMUNITY): Payer: Self-pay | Admitting: Internal Medicine

## 2018-02-07 DIAGNOSIS — R634 Abnormal weight loss: Secondary | ICD-10-CM

## 2018-02-25 ENCOUNTER — Ambulatory Visit (HOSPITAL_COMMUNITY)
Admission: RE | Admit: 2018-02-25 | Discharge: 2018-02-25 | Disposition: A | Discharge status: Home / Self Care | Payer: Medicare HMO | Source: Ambulatory Visit | Attending: Internal Medicine | Admitting: Internal Medicine

## 2018-02-25 DIAGNOSIS — K5730 Diverticulosis of large intestine without perforation or abscess without bleeding: Secondary | ICD-10-CM | POA: Insufficient documentation

## 2018-02-25 DIAGNOSIS — I2510 Atherosclerotic heart disease of native coronary artery without angina pectoris: Secondary | ICD-10-CM | POA: Insufficient documentation

## 2018-02-25 DIAGNOSIS — R634 Abnormal weight loss: Secondary | ICD-10-CM

## 2018-02-25 DIAGNOSIS — I700 Atherosclerosis of aorta: Secondary | ICD-10-CM | POA: Diagnosis not present

## 2018-02-25 DIAGNOSIS — I77810 Thoracic aortic ectasia: Secondary | ICD-10-CM | POA: Diagnosis not present

## 2018-02-25 DIAGNOSIS — R1111 Vomiting without nausea: Secondary | ICD-10-CM | POA: Diagnosis not present

## 2018-02-25 DIAGNOSIS — R935 Abnormal findings on diagnostic imaging of other abdominal regions, including retroperitoneum: Secondary | ICD-10-CM | POA: Diagnosis not present

## 2018-03-04 ENCOUNTER — Ambulatory Visit (INDEPENDENT_AMBULATORY_CARE_PROVIDER_SITE_OTHER): Payer: Medicare HMO | Admitting: Internal Medicine

## 2018-03-04 ENCOUNTER — Encounter (INDEPENDENT_AMBULATORY_CARE_PROVIDER_SITE_OTHER): Payer: Self-pay | Admitting: Internal Medicine

## 2018-04-06 ENCOUNTER — Other Ambulatory Visit: Payer: Self-pay

## 2018-04-06 ENCOUNTER — Emergency Department (HOSPITAL_COMMUNITY): Payer: Medicare HMO

## 2018-04-06 ENCOUNTER — Encounter (HOSPITAL_COMMUNITY): Payer: Self-pay | Admitting: Emergency Medicine

## 2018-04-06 ENCOUNTER — Inpatient Hospital Stay (HOSPITAL_COMMUNITY): Payer: Medicare HMO

## 2018-04-06 ENCOUNTER — Inpatient Hospital Stay (HOSPITAL_COMMUNITY)
Admission: EM | Admit: 2018-04-06 | Discharge: 2018-04-07 | DRG: 922 | Disposition: A | Discharge status: Home Health | Payer: Medicare HMO | Attending: Internal Medicine | Admitting: Internal Medicine

## 2018-04-06 DIAGNOSIS — E43 Unspecified severe protein-calorie malnutrition: Secondary | ICD-10-CM | POA: Diagnosis not present

## 2018-04-06 DIAGNOSIS — E860 Dehydration: Secondary | ICD-10-CM | POA: Diagnosis not present

## 2018-04-06 DIAGNOSIS — Z681 Body mass index (BMI) 19 or less, adult: Secondary | ICD-10-CM | POA: Diagnosis not present

## 2018-04-06 DIAGNOSIS — Z803 Family history of malignant neoplasm of breast: Secondary | ICD-10-CM

## 2018-04-06 DIAGNOSIS — T670XXA Heatstroke and sunstroke, initial encounter: Principal | ICD-10-CM

## 2018-04-06 DIAGNOSIS — Z841 Family history of disorders of kidney and ureter: Secondary | ICD-10-CM

## 2018-04-06 DIAGNOSIS — E782 Mixed hyperlipidemia: Secondary | ICD-10-CM | POA: Diagnosis not present

## 2018-04-06 DIAGNOSIS — Z87891 Personal history of nicotine dependence: Secondary | ICD-10-CM | POA: Diagnosis not present

## 2018-04-06 DIAGNOSIS — Z800 Family history of malignant neoplasm of digestive organs: Secondary | ICD-10-CM | POA: Diagnosis not present

## 2018-04-06 DIAGNOSIS — I499 Cardiac arrhythmia, unspecified: Secondary | ICD-10-CM | POA: Diagnosis not present

## 2018-04-06 DIAGNOSIS — Z801 Family history of malignant neoplasm of trachea, bronchus and lung: Secondary | ICD-10-CM

## 2018-04-06 DIAGNOSIS — S0990XA Unspecified injury of head, initial encounter: Secondary | ICD-10-CM | POA: Diagnosis not present

## 2018-04-06 DIAGNOSIS — R6510 Systemic inflammatory response syndrome (SIRS) of non-infectious origin without acute organ dysfunction: Secondary | ICD-10-CM | POA: Diagnosis present

## 2018-04-06 DIAGNOSIS — M25572 Pain in left ankle and joints of left foot: Secondary | ICD-10-CM | POA: Diagnosis not present

## 2018-04-06 DIAGNOSIS — S199XXA Unspecified injury of neck, initial encounter: Secondary | ICD-10-CM | POA: Diagnosis not present

## 2018-04-06 DIAGNOSIS — R296 Repeated falls: Secondary | ICD-10-CM | POA: Diagnosis present

## 2018-04-06 DIAGNOSIS — R55 Syncope and collapse: Secondary | ICD-10-CM | POA: Diagnosis not present

## 2018-04-06 DIAGNOSIS — R7989 Other specified abnormal findings of blood chemistry: Secondary | ICD-10-CM

## 2018-04-06 DIAGNOSIS — Z8249 Family history of ischemic heart disease and other diseases of the circulatory system: Secondary | ICD-10-CM | POA: Diagnosis not present

## 2018-04-06 DIAGNOSIS — N179 Acute kidney failure, unspecified: Secondary | ICD-10-CM | POA: Diagnosis not present

## 2018-04-06 DIAGNOSIS — E872 Acidosis: Secondary | ICD-10-CM | POA: Diagnosis present

## 2018-04-06 DIAGNOSIS — I129 Hypertensive chronic kidney disease with stage 1 through stage 4 chronic kidney disease, or unspecified chronic kidney disease: Secondary | ICD-10-CM | POA: Diagnosis present

## 2018-04-06 DIAGNOSIS — E039 Hypothyroidism, unspecified: Secondary | ICD-10-CM | POA: Diagnosis present

## 2018-04-06 DIAGNOSIS — F0390 Unspecified dementia without behavioral disturbance: Secondary | ICD-10-CM | POA: Diagnosis present

## 2018-04-06 DIAGNOSIS — N183 Chronic kidney disease, stage 3 unspecified: Secondary | ICD-10-CM | POA: Diagnosis present

## 2018-04-06 DIAGNOSIS — I471 Supraventricular tachycardia: Secondary | ICD-10-CM | POA: Diagnosis not present

## 2018-04-06 DIAGNOSIS — Z885 Allergy status to narcotic agent status: Secondary | ICD-10-CM

## 2018-04-06 DIAGNOSIS — R404 Transient alteration of awareness: Secondary | ICD-10-CM | POA: Diagnosis not present

## 2018-04-06 DIAGNOSIS — R4182 Altered mental status, unspecified: Secondary | ICD-10-CM | POA: Diagnosis not present

## 2018-04-06 DIAGNOSIS — R778 Other specified abnormalities of plasma proteins: Secondary | ICD-10-CM

## 2018-04-06 DIAGNOSIS — N390 Urinary tract infection, site not specified: Secondary | ICD-10-CM | POA: Diagnosis not present

## 2018-04-06 DIAGNOSIS — X30XXXA Exposure to excessive natural heat, initial encounter: Secondary | ICD-10-CM

## 2018-04-06 DIAGNOSIS — E785 Hyperlipidemia, unspecified: Secondary | ICD-10-CM | POA: Diagnosis present

## 2018-04-06 DIAGNOSIS — R52 Pain, unspecified: Secondary | ICD-10-CM

## 2018-04-06 DIAGNOSIS — Z9851 Tubal ligation status: Secondary | ICD-10-CM | POA: Diagnosis not present

## 2018-04-06 DIAGNOSIS — T670XXD Heatstroke and sunstroke, subsequent encounter: Secondary | ICD-10-CM | POA: Diagnosis not present

## 2018-04-06 DIAGNOSIS — T6701XA Heatstroke and sunstroke, initial encounter: Secondary | ICD-10-CM | POA: Diagnosis present

## 2018-04-06 DIAGNOSIS — R000 Tachycardia, unspecified: Secondary | ICD-10-CM | POA: Diagnosis not present

## 2018-04-06 DIAGNOSIS — I10 Essential (primary) hypertension: Secondary | ICD-10-CM | POA: Diagnosis present

## 2018-04-06 HISTORY — DX: Unspecified dementia, unspecified severity, without behavioral disturbance, psychotic disturbance, mood disturbance, and anxiety: F03.90

## 2018-04-06 LAB — I-STAT CHEM 8, ED
BUN: 28 mg/dL — ABNORMAL HIGH (ref 8–23)
Calcium, Ion: 1.29 mmol/L (ref 1.15–1.40)
Chloride: 105 mmol/L (ref 98–111)
Creatinine, Ser: 1.6 mg/dL — ABNORMAL HIGH (ref 0.44–1.00)
Glucose, Bld: 223 mg/dL — ABNORMAL HIGH (ref 70–99)
HEMATOCRIT: 39 % (ref 36.0–46.0)
HEMOGLOBIN: 13.3 g/dL (ref 12.0–15.0)
POTASSIUM: 3.5 mmol/L (ref 3.5–5.1)
SODIUM: 137 mmol/L (ref 135–145)
TCO2: 15 mmol/L — AB (ref 22–32)

## 2018-04-06 LAB — COMPREHENSIVE METABOLIC PANEL
ALBUMIN: 3.8 g/dL (ref 3.5–5.0)
ALT: 28 U/L (ref 0–44)
ANION GAP: 17 — AB (ref 5–15)
AST: 55 U/L — AB (ref 15–41)
Alkaline Phosphatase: 81 U/L (ref 38–126)
BILIRUBIN TOTAL: 1.5 mg/dL — AB (ref 0.3–1.2)
BUN: 30 mg/dL — ABNORMAL HIGH (ref 8–23)
CHLORIDE: 106 mmol/L (ref 98–111)
CO2: 15 mmol/L — ABNORMAL LOW (ref 22–32)
Calcium: 9.9 mg/dL (ref 8.9–10.3)
Creatinine, Ser: 1.89 mg/dL — ABNORMAL HIGH (ref 0.44–1.00)
GFR calc Af Amer: 27 mL/min — ABNORMAL LOW (ref >60)
GFR, EST NON AFRICAN AMERICAN: 23 mL/min — AB (ref >60)
GLUCOSE: 225 mg/dL — AB (ref 70–99)
POTASSIUM: 3.6 mmol/L (ref 3.5–5.1)
Sodium: 138 mmol/L (ref 135–145)
TOTAL PROTEIN: 6.8 g/dL (ref 6.5–8.1)

## 2018-04-06 LAB — URINALYSIS, ROUTINE W REFLEX MICROSCOPIC
BILIRUBIN URINE: NEGATIVE
Glucose, UA: NEGATIVE mg/dL
Hgb urine dipstick: NEGATIVE
Ketones, ur: 5 mg/dL — AB
Nitrite: NEGATIVE
Protein, ur: 30 mg/dL — AB
SPECIFIC GRAVITY, URINE: 1.017 (ref 1.005–1.030)
pH: 5 (ref 5.0–8.0)

## 2018-04-06 LAB — CBC WITH DIFFERENTIAL/PLATELET
BASOS ABS: 0 10*3/uL (ref 0.0–0.1)
Basophils Relative: 0 %
EOS ABS: 0 10*3/uL (ref 0.0–0.7)
Eosinophils Relative: 0 %
HEMATOCRIT: 40.1 % (ref 36.0–46.0)
HEMOGLOBIN: 13.5 g/dL (ref 12.0–15.0)
LYMPHS PCT: 16 %
Lymphs Abs: 3.8 10*3/uL (ref 0.7–4.0)
MCH: 30.7 pg (ref 26.0–34.0)
MCHC: 33.7 g/dL (ref 30.0–36.0)
MCV: 91.1 fL (ref 78.0–100.0)
MONOS PCT: 3 %
Monocytes Absolute: 0.7 10*3/uL (ref 0.1–1.0)
NEUTROS PCT: 81 %
Neutro Abs: 19.2 10*3/uL — ABNORMAL HIGH (ref 1.7–7.7)
Platelets: 343 10*3/uL (ref 150–400)
RBC: 4.4 MIL/uL (ref 3.87–5.11)
RDW: 14.2 % (ref 11.5–15.5)
WBC: 23.7 10*3/uL — ABNORMAL HIGH (ref 4.0–10.5)

## 2018-04-06 LAB — ETHANOL

## 2018-04-06 LAB — I-STAT CG4 LACTIC ACID, ED: LACTIC ACID, VENOUS: 2.4 mmol/L — AB (ref 0.5–1.9)

## 2018-04-06 LAB — BLOOD GAS, VENOUS
ACID-BASE DEFICIT: 8.7 mmol/L — AB (ref 0.0–2.0)
Bicarbonate: 18.5 mmol/L — ABNORMAL LOW (ref 20.0–28.0)
O2 CONTENT: 4 L/min
O2 Saturation: 98.7 %
PATIENT TEMPERATURE: 37
pCO2, Ven: 23.6 mmHg — ABNORMAL LOW (ref 44.0–60.0)
pH, Ven: 7.418 (ref 7.250–7.430)
pO2, Ven: 163 mmHg — ABNORMAL HIGH (ref 32.0–45.0)

## 2018-04-06 LAB — CK: Total CK: 98 U/L (ref 38–234)

## 2018-04-06 LAB — RAPID URINE DRUG SCREEN, HOSP PERFORMED
AMPHETAMINES: NOT DETECTED
Benzodiazepines: NOT DETECTED
COCAINE: NOT DETECTED
OPIATES: NOT DETECTED
TETRAHYDROCANNABINOL: NOT DETECTED

## 2018-04-06 LAB — ACETAMINOPHEN LEVEL

## 2018-04-06 LAB — I-STAT TROPONIN, ED: TROPONIN I, POC: 0.08 ng/mL (ref 0.00–0.08)

## 2018-04-06 LAB — MRSA PCR SCREENING: MRSA by PCR: NEGATIVE

## 2018-04-06 LAB — SALICYLATE LEVEL

## 2018-04-06 LAB — TROPONIN I: TROPONIN I: 0.09 ng/mL — AB (ref <0.03)

## 2018-04-06 LAB — PROTIME-INR
INR: 1.08
PROTHROMBIN TIME: 13.9 s (ref 11.4–15.2)

## 2018-04-06 LAB — CBG MONITORING, ED: Glucose-Capillary: 203 mg/dL — ABNORMAL HIGH (ref 70–99)

## 2018-04-06 LAB — TSH: TSH: 0.996 u[IU]/mL (ref 0.350–4.500)

## 2018-04-06 MED ORDER — SODIUM CHLORIDE 0.9 % IV SOLN
1.0000 g | INTRAVENOUS | Status: DC
  Filled 2018-04-06: qty 10

## 2018-04-06 MED ORDER — SODIUM CHLORIDE 0.9 % IV SOLN
1.0000 g | Freq: Once | INTRAVENOUS | Status: AC
  Administered 2018-04-06: 1 g via INTRAVENOUS
  Filled 2018-04-06: qty 10

## 2018-04-06 MED ORDER — LEVOTHYROXINE SODIUM 88 MCG PO TABS
88.0000 ug | ORAL_TABLET | Freq: Every day | ORAL | Status: DC
  Administered 2018-04-07: 88 ug via ORAL
  Filled 2018-04-06: qty 1

## 2018-04-06 MED ORDER — ONDANSETRON HCL 4 MG PO TABS: 4.0000 mg | ORAL_TABLET | Freq: Four times a day (QID) | ORAL | Status: DC | PRN

## 2018-04-06 MED ORDER — LORAZEPAM 0.5 MG PO TABS
0.2500 mg | ORAL_TABLET | Freq: Two times a day (BID) | ORAL | Status: DC | PRN
  Administered 2018-04-06: 0.5 mg via ORAL
  Filled 2018-04-06: qty 1

## 2018-04-06 MED ORDER — DONEPEZIL HCL 5 MG PO TABS
5.0000 mg | ORAL_TABLET | Freq: Every day | ORAL | Status: DC
  Administered 2018-04-06 – 2018-04-07 (×2): 5 mg via ORAL
  Filled 2018-04-06 (×2): qty 1

## 2018-04-06 MED ORDER — ACETAMINOPHEN 650 MG RE SUPP: 650.0000 mg | Freq: Four times a day (QID) | RECTAL | Status: DC | PRN

## 2018-04-06 MED ORDER — ADULT MULTIVITAMIN W/MINERALS CH
1.0000 | ORAL_TABLET | Freq: Every day | ORAL | Status: DC
  Administered 2018-04-06 – 2018-04-07 (×2): 1 via ORAL
  Filled 2018-04-06 (×2): qty 1

## 2018-04-06 MED ORDER — SODIUM CHLORIDE 0.9 % IV BOLUS
1000.0000 mL | Freq: Once | INTRAVENOUS | Status: AC
  Administered 2018-04-06: 1000 mL via INTRAVENOUS

## 2018-04-06 MED ORDER — SODIUM CHLORIDE 0.9 % IV SOLN
INTRAVENOUS | Status: DC
  Administered 2018-04-06 – 2018-04-07 (×2): via INTRAVENOUS

## 2018-04-06 MED ORDER — SENNOSIDES-DOCUSATE SODIUM 8.6-50 MG PO TABS: 1.0000 | ORAL_TABLET | Freq: Every evening | ORAL | Status: DC | PRN

## 2018-04-06 MED ORDER — SERTRALINE HCL 50 MG PO TABS
100.0000 mg | ORAL_TABLET | Freq: Every day | ORAL | Status: DC
  Administered 2018-04-06 – 2018-04-07 (×2): 100 mg via ORAL
  Filled 2018-04-06 (×2): qty 2

## 2018-04-06 MED ORDER — ENOXAPARIN SODIUM 30 MG/0.3ML ~~LOC~~ SOLN
30.0000 mg | SUBCUTANEOUS | Status: DC
  Administered 2018-04-06: 30 mg via SUBCUTANEOUS
  Filled 2018-04-06: qty 0.3

## 2018-04-06 MED ORDER — SODIUM CHLORIDE 0.9 % IV SOLN
INTRAVENOUS | Status: DC
Start: 2018-04-06 — End: 2018-04-06
  Administered 2018-04-06: 13:00:00 via INTRAVENOUS

## 2018-04-06 MED ORDER — ROSUVASTATIN CALCIUM 10 MG PO TABS
10.0000 mg | ORAL_TABLET | Freq: Every day | ORAL | Status: DC
  Administered 2018-04-06 – 2018-04-07 (×2): 10 mg via ORAL
  Filled 2018-04-06 (×2): qty 1

## 2018-04-06 MED ORDER — ONDANSETRON HCL 4 MG/2ML IJ SOLN
4.0000 mg | Freq: Four times a day (QID) | INTRAMUSCULAR | Status: DC | PRN
  Administered 2018-04-06: 4 mg via INTRAVENOUS
  Filled 2018-04-06: qty 2

## 2018-04-06 MED ORDER — MULTIVITAMINS PO CAPS: 1.0000 | ORAL_CAPSULE | Freq: Every day | ORAL | Status: DC

## 2018-04-06 MED ORDER — ASPIRIN EC 81 MG PO TBEC
81.0000 mg | DELAYED_RELEASE_TABLET | Freq: Every day | ORAL | Status: DC
  Administered 2018-04-06 – 2018-04-07 (×2): 81 mg via ORAL
  Filled 2018-04-06 (×2): qty 1

## 2018-04-06 MED ORDER — SODIUM CHLORIDE 0.9 % IV BOLUS: 1000.0000 mL | Freq: Once | INTRAVENOUS | Status: DC

## 2018-04-06 MED ORDER — ACETAMINOPHEN 325 MG PO TABS
650.0000 mg | ORAL_TABLET | Freq: Four times a day (QID) | ORAL | Status: DC | PRN
  Administered 2018-04-06 – 2018-04-07 (×2): 650 mg via ORAL
  Filled 2018-04-06 (×2): qty 2

## 2018-04-06 NOTE — ED Notes (Signed)
Report given to ICU

## 2018-04-06 NOTE — ED Notes (Signed)
MD notified of pt's BP, bolus going and pt in trendelenburg.

## 2018-04-06 NOTE — ED Notes (Signed)
Pt talking to family at this time. They believe that pt was letting the dog out today when pt fell.

## 2018-04-06 NOTE — ED Notes (Signed)
Pt c/o L ankle pain, MD notified.

## 2018-04-06 NOTE — ED Triage Notes (Signed)
Pt brought in by ems after being found outside on the porch by her neighbor.  Pt was in vtach with pulse on their arrival.  Given Amiodarone 150mg  followed by Cardioversion at 100J.  Converted to sinus tach.  Pt is awake, but minimally responsive.

## 2018-04-06 NOTE — ED Notes (Signed)
Date and time results received: 04/06/18 1:18 PM  (use smartphrase ".now" to insert current time)  Test: Troponin Critical Value: 0.09  Name of Provider Notified: Clarene DukeMcManus  Orders Received? Or Actions Taken?: Orders Received - See Orders for details

## 2018-04-06 NOTE — ED Notes (Signed)
Cooling blanket removed per MD, warm blanket applied.

## 2018-04-06 NOTE — H&P (Signed)
History and Physical    Robin Grant ZOX:[id number] DOB: 14-Aug-1931 DOA: 04/06/2018  Referring MD/NP/PA: Samuel Jester, EDP PCP: [name], MD  Patient coming from: Home  Chief Complaint: Found out in the heat  HPI: Robin Grant is a 84 y.o. female with past medical history significant for hyperlipidemia, hypothyroidism, question dementia who was found outside by the neighbor in the heat.  Details are unclear as there are no family or friends at bedside and patient still remains somewhat confused.  The neighbor found her outside delirious, slumped on her side, with a visible sunburn, bruises to her left temple, center of her chest both arms and right leg.  It is unclear how long she had been out in the heat.  Upon EMS arrival it appears they thought she might have been in V. tach and she received an amiodarone bolus and cardioversion, subsequent evaluation of strips shows sinus tachycardia. She is brought into the hospital for evaluation.  She was found to have a temperature of 104 that persisted for a few hours.  Labs showed creatinine of 1.89 which is around her baseline, gap acidosis with a bicarb of 15 and a gap of 17, minimally elevated troponin to 0.09, lactic acid was elevated at 2.4, WBCs are 23.7.  Chest x-ray shows no active disease, CT scan of the head and cervical spine shows chronic white matter changes in the brain with no acute intracranial abnormalities.  No fracture or malalignment of the cervical spine with mild degenerative changes.  UA shows many bacteria but negative nitrate and only 0-3 WBCs.  Admission is requested for further evaluation and management.  Past Medical/Surgical History: Past Medical History:  Diagnosis Date  . Anxiety   . Chronic constipation   . Dementia   . HTN (hypertension)   . Hyperlipidemia   . Hypothyroidism   . Insomnia     Past Surgical History:  Procedure Laterality Date  . APPENDECTOMY    . COLONOSCOPY  2007   Dr. [name], pancolonic  diverticula.  . COLONOSCOPY N/A 07/28/2013   Procedure: COLONOSCOPY;  Surgeon: West Bali, MD;  Location: [facility];  Service: Endoscopy;  Laterality: N/A;  PLEASE SCHEDULE AT 3 PM PER DR. FIELDS   . ESOPHAGOGASTRODUODENOSCOPY  05/21/2011   Dr. [name]: normal tubular esophagus, s/p empiric dilation with 61 F, innocent appearing AVM, small hiatal hernia  . GIVENS CAPSULE STUDY N/A 07/30/2013   Procedure: GIVENS CAPSULE STUDY;  Surgeon: [name], MD;  Location: [facility];  Service: Endoscopy;  Laterality: N/A;  . PORT-A-CATH REMOVAL  2008  . right ankle surgery  2007   osteomyelitis  . TUBAL LIGATION      Social History:  reports that she has quit smoking. She has never used smokeless tobacco. She reports that she does not drink alcohol or use drugs.  Allergies: Allergies  Allergen Reactions  . Hydrocodone Nausea And Vomiting  . Codeine Nausea And Vomiting    Family History:  Family History  Problem Relation Age of Onset  . Breast cancer Mother        deceased from MI  . Stroke Father        MI too  . Colon cancer Maternal Aunt   . Rheumatic fever Sister 41       open heart surgery age 11, deceased age 18  . Lung cancer Sister   . Heart attack Sister   . Kidney disease Sister  born with one kidney, required transplant    Prior to Admission medications   Medication Sig Start Date End Date Taking? Authorizing Provider  aspirin EC 81 MG tablet Take 1 tablet (81 mg total) by mouth daily. 07/21/17 07/21/18 Yes Tat, Onalee Hua, MD  donepezil (ARICEPT) 5 MG tablet Take 5 mg by mouth daily.   Yes [provider]  levothyroxine (SYNTHROID, LEVOTHROID) 88 MCG tablet Take 88 mcg by mouth daily. 05/28/17  Yes [provider]  LORazepam (ATIVAN) 0.5 MG tablet Take 0.5-1 tablets by mouth 2 (two) times daily as needed for anxiety or sleep.  04/18/17  Yes [provider]  Melatonin 5 MG TABS Take 1 tablet by mouth daily.   Yes [provider]  Multiple Vitamin (MULTIVITAMIN) capsule Take 1 capsule by mouth daily.     Yes [provider]  ondansetron (ZOFRAN) 4 MG tablet Take 4 mg by mouth every 8 (eight) hours as needed for nausea or vomiting.   Yes [provider]  rosuvastatin (CRESTOR) 10 MG tablet Take 10 mg by mouth daily.   Yes [provider]  sertraline (ZOLOFT) 100 MG tablet Take 100 mg by mouth daily.   Yes [provider]    Review of Systems:  Unable to obtain given ongoing confusion.   Physical Exam: Vitals:   04/06/18 1415 04/06/18 1430 04/06/18 1445 04/06/18 1524  BP: (!) 85/50 96/62 (!) 86/53   Pulse: 81 83 83 81  Resp: (!) 26 (!) 22 (!) 22   Temp: 97.9 F (36.6 C) 97.9 F (36.6 C) (!) 97.3 F (36.3 C) (!) 97.2 F (36.2 C)  SpO2: 96% 96% 100% 98%  Weight:    44.3 kg (97 lb 10.6 oz)  Height:    5\' 2"  (1.575 m)     Constitutional: NAD, calm, comfortable, pleasantly confused Eyes: PERRL, lids and conjunctivae normal ENMT: Mucous membranes are  dry. Posterior pharynx clear of any exudate or lesions. Neck: normal, supple, no masses, no thyromegaly Respiratory: clear to auscultation bilaterally, no wheezing, no crackles. Normal respiratory effort. No accessory muscle use.  Cardiovascular: Regular rate and rhythm, no murmurs / rubs / gallops. No extremity edema. 2+ pedal pulses. No carotid bruits.  Abdomen: no tenderness, no masses palpated. No hepatosplenomegaly. Bowel sounds positive.  Musculoskeletal: no clubbing / cyanosis. No joint deformity upper and lower extremities. Good ROM, no contractures. Normal muscle tone.  Skin: no rashes, lesions, ulcers. No induration Neurologic: CN 2-12 grossly intact. Sensation intact, DTR normal. Strength 5/5 in all 4.  Psychiatric: Normal judgment and insight. Alert and oriented x 3. Normal mood.    Labs on Admission: I have personally reviewed the following labs and imaging studies  CBC: Recent Labs  Lab 04/06/18 1219  04/06/18 1227  WBC  --  23.7*  NEUTROABS  --  19.2*  HGB 13.3 13.5  HCT 39.0 40.1  MCV  --  91.1  PLT  --  343   Basic Metabolic Panel: Recent Labs  Lab 04/06/18 1219 04/06/18 1227  NA 137 138  K 3.5 3.6  CL 105 106  CO2  --  15*  GLUCOSE 223* 225*  BUN 28* 30*  CREATININE 1.60* 1.89*  CALCIUM  --  9.9   GFR: Estimated Creatinine Clearance: 15.5 mL/min (A) (by C-G formula based on SCr of 1.89 mg/dL (H)). Liver Function Tests: Recent Labs  Lab 04/06/18 1227  AST 55*  ALT 28  ALKPHOS 81  BILITOT 1.5*  PROT 6.8  ALBUMIN 3.8   No results for input(s): LIPASE, AMYLASE in the last 168 hours. No results for input(s): AMMONIA in the last 168 hours. Coagulation Profile: Recent Labs  Lab 04/06/18 1227  INR 1.08   Cardiac Enzymes: Recent Labs  Lab 04/06/18 1227  CKTOTAL 98  TROPONINI 0.09*   BNP (last 3 results) No results for input(s): PROBNP in the last 8760 hours. HbA1C: No results for input(s): HGBA1C in the last 72 hours. CBG: Recent Labs  Lab 04/06/18 1227  GLUCAP 203*   Lipid Profile: No results for input(s): CHOL, HDL, LDLCALC, TRIG, CHOLHDL, LDLDIRECT in the last 72 hours. Thyroid Function Tests: No results for input(s): TSH, T4TOTAL, FREET4, T3FREE, THYROIDAB in the last 72 hours. Anemia Panel: No results for input(s): VITAMINB12, FOLATE, FERRITIN, TIBC, IRON, RETICCTPCT in the last 72 hours. Urine analysis:    Component Value Date/Time   COLORURINE AMBER (A) 04/06/2018 1227   APPEARANCEUR CLOUDY (A) 04/06/2018 1227   LABSPEC 1.017 04/06/2018 1227   PHURINE 5.0 04/06/2018 1227   GLUCOSEU NEGATIVE 04/06/2018 1227   HGBUR NEGATIVE 04/06/2018 1227   BILIRUBINUR NEGATIVE 04/06/2018 1227   KETONESUR 5 (A) 04/06/2018 1227   PROTEINUR 30 (A) 04/06/2018 1227   NITRITE NEGATIVE 04/06/2018 1227   LEUKOCYTESUR TRACE (A) 04/06/2018 1227   Sepsis Labs: @LABRCNTIP (procalcitonin:4,lacticidven:4) ) Recent Results (from the past 240 hour(s))    Culture, blood (routine x 2)     Status: None (Preliminary result)   Collection Time: 04/06/18  1:02 PM  Result Value Ref Range Status   Specimen Description BLOOD LEFT ARM  Final   Special Requests   Final    BOTTLES DRAWN AEROBIC AND ANAEROBIC Blood Culture adequate volume Performed at [facility], [address] [place], [place] [address]    Culture PENDING  Incomplete   Report Status PENDING  Incomplete  Culture, blood (routine x 2)     Status: None (Preliminary result)   Collection Time: 04/06/18  1:13 PM  Result Value Ref Range Status   Specimen Description BLOOD LEFT WRIST  Final   Special Requests   Final    BOTTLES DRAWN AEROBIC AND ANAEROBIC Blood Culture adequate volume Performed at [facility], [address], [place], [place] [address]    Culture PENDING  Incomplete   Report Status PENDING  Incomplete  MRSA PCR Screening     Status: None   Collection Time: 04/06/18  3:10 PM  Result Value Ref Range Status   MRSA by PCR NEGATIVE NEGATIVE Final    Comment:        The GeneXpert MRSA Assay (FDA approved for NASAL specimens only), is one component of a comprehensive MRSA colonization surveillance program. It is not intended to diagnose MRSA infection nor to guide or monitor treatment for MRSA infections. Performed at [facility], [address] [place], [place] [address]      Radiological Exams on Admission: Ct Head Wo Contrast  Result Date: 04/06/2018 CLINICAL DATA:  Patient found down.  Trauma.  Altered mental status. EXAM: CT HEAD WITHOUT CONTRAST CT CERVICAL SPINE WITHOUT CONTRAST TECHNIQUE: Multidetector CT imaging of the head and cervical spine was performed following the standard protocol without intravenous contrast. Multiplanar CT image reconstructions of the cervical spine were also generated. COMPARISON:  None. FINDINGS: CT HEAD FINDINGS Brain: No subdural, epidural, or subarachnoid hemorrhage. Scattered white matter changes are centrally stable. A right  thalamic lacunar infarct is stable. The ventricles and sulci are unchanged. Cerebellum, brainstem, and basal cisterns are normal.  No mass effect or midline shift. Vascular: Calcified atherosclerosis in the intracranial carotids. Skull: Normal. Negative for fracture or focal lesion. Sinuses/Orbits: A tiny amount of fluid is seen posteriorly in both maxillary sinuses. Paranasal sinuses, mastoid air cells, and middle ears are otherwise normal in well aerated. Other: None. CT CERVICAL SPINE FINDINGS Alignment: Normal. Skull base and vertebrae: Incomplete fusion of the left side of the C1 posterior arch, unchanged. No acute fracture. Soft tissues and spinal canal: No prevertebral fluid or swelling. No visible canal hematoma. Disc levels: Mild degenerative changes most prominent at C5-6 with tiny anterior and posterior osteophytes. The upper cervical spine facet degenerative changes noted. Upper chest: Negative. Other: No other abnormalities identified. IMPRESSION: 1. Chronic white matter changes in the brain. No acute intracranial abnormality. 2. There is a tiny amount of fluid in the posterior maxillary sinuses which is nonspecific. Recommend clinical correlation. 3. No fracture or traumatic malalignment in the cervical spine. Mild degenerative changes. Electronically Signed   By: David  Williams IIGerome SamI M.D   On: 04/06/2018 13:37   Ct Cervical Spine Wo Contrast  Result Date: 04/06/2018 CLINICAL DATA:  Patient found down.  Trauma.  Altered mental status. EXAM: CT HEAD WITHOUT CONTRAST CT CERVICAL SPINE WITHOUT CONTRAST TECHNIQUE: Multidetector CT imaging of the head and cervical spine was performed following the standard protocol without intravenous contrast. Multiplanar CT image reconstructions of the cervical spine were also generated. COMPARISON:  None. FINDINGS: CT HEAD FINDINGS Brain: No subdural, epidural, or subarachnoid hemorrhage. Scattered white matter changes are centrally stable. A right thalamic lacunar  infarct is stable. The ventricles and sulci are unchanged. Cerebellum, brainstem, and basal cisterns are normal. No mass effect or midline shift. Vascular: Calcified atherosclerosis in the intracranial carotids. Skull: Normal. Negative for fracture or focal lesion. Sinuses/Orbits: A tiny amount of fluid is seen posteriorly in both maxillary sinuses. Paranasal sinuses, mastoid air cells, and middle ears are otherwise normal in well aerated. Other: None. CT CERVICAL SPINE FINDINGS Alignment: Normal. Skull base and vertebrae: Incomplete fusion of the left side of the C1 posterior arch, unchanged. No acute fracture. Soft tissues and spinal canal: No prevertebral fluid or swelling. No visible canal hematoma. Disc levels: Mild degenerative changes most prominent at C5-6 with tiny anterior and posterior osteophytes. The upper cervical spine facet degenerative changes noted. Upper chest: Negative. Other: No other abnormalities identified. IMPRESSION: 1. Chronic white matter changes in the brain. No acute intracranial abnormality. 2. There is a tiny amount of fluid in the posterior maxillary sinuses which is nonspecific. Recommend clinical correlation. 3. No fracture or traumatic malalignment in the cervical spine. Mild degenerative changes. Electronically Signed   By: Gerome Samavid  Williams III M.D   On: 04/06/2018 13:37   Dg Chest Port 1 View  Result Date: 04/06/2018 CLINICAL DATA:  Acute mental status change.  Found down. EXAM: PORTABLE CHEST 1 VIEW COMPARISON:  December 02, 2017 FINDINGS: No pneumothorax. There is a torturous thoracic aorta which is unchanged given portable technique. The cardiomediastinal silhouette is stable. No pulmonary nodules or masses. No focal infiltrates. No overt edema. IMPRESSION: No active disease. Electronically Signed   By: Gerome Samavid  Williams III M.D   On: 04/06/2018 13:10    EKG: Independently reviewed.  EKG on arrival shows sinus tachycardia with a rate of 152, no acute  changes.  Assessment/Plan Principal Problem:   Heat stroke and sunstroke Active Problems:   Acute lower UTI   Hyperlipidemia   Essential hypertension   Chronic kidney disease, stage III (moderate) (  HCC)   Mixed hyperlipidemia   Hypothyroidism    Tachycardia, fever -At this point given the circumstances of her presentation I believe this most likely represents heat stroke.  She has responded well to IV fluids and Tylenol. -Certainly this could potentially be ??sepsis although I do not have a source.  She received Rocephin for presumptive UTI given bacteria in her urine but with negative nitrates, no WBCs and no dysuria or frequency I am hesitant to call this a ??UTI. -Given severity of illness I believe it is appropriate to continue Rocephin for at least another 24 hours. -I think her hypotension can easily be explained by severe dehydration and amiodarone given in the field. -Also her significant tachycardia has resolved with fluids and normalization of temperature. -Mild anion gap metabolic acidosis likely due to dehydration and starvation, continue to follow.  Hypothyroidism -Check TSH. -Continue home dose of Synthroid.  Hyperlipidemia -Continue Crestor  Chronic kidney disease stage III -Creatinine is at baseline of around 1.6-1.8.   DVT prophylaxis: Lovenox Code Status: Full code Family Communication: Patient only Disposition Plan: May need SNF pending home situation Consults called: None Admission status: Admit - It is my clinical opinion that admission to INPATIENT is reasonable and necessary because of the expectation that this patient will require hospital care that crosses at least 2 midnights to treat this condition based on the medical complexity of the problems presented.  Given the aforementioned information, the predictability of an adverse outcome is felt to be significant.      Time Spent: 85 minutes  Estela Philip Aspen MD [facility] Pager  [phone]  If 7PM-7AM, please contact night-coverage [email or web address] Password [email or web address]  04/06/2018, 5:04 PM

## 2018-04-06 NOTE — ED Provider Notes (Signed)
[facility] EMERGENCY DEPARTMENT Provider Note   CSN: [id number] Arrival date & time: 04/06/18  1211     History   Chief Complaint Chief Complaint  Patient presents with  . Altered Mental Status    HPI WILLIAM LASKE is a 84 y.o. female.  The history is provided by the EMS personnel and a relative. The history is limited by the condition of the patient (AMS).  Altered Mental Status    Pt was seen at 1215. Per EMS and pt's family: Pt last seen last night. Pt found today outside on her porch in the heat by a neighbor, unresponsive, bruised, sunburned, "felt warm." Unknown how long pt was outside.  EMS states pt was "minimally responsive" on their arrival to scene, monitor "VT with pulse." EMS gave "IV amiodarone 150mg  followed by cardioversion at 100J to sinus tach." Pt awake/eyes open, minimally responsive. Family states pt "falls a lot" and been "declining" over past several weeks.     Past Medical History:  Diagnosis Date  . Anxiety   . Chronic constipation   . Dementia   . HTN (hypertension)   . Hyperlipidemia   . Hypothyroidism   . Insomnia     Patient Active Problem List   Diagnosis Date Noted  . Dehydration 12/03/2017  . Hypothyroidism 12/03/2017  . Acidosis 12/03/2017  . Acute renal failure superimposed on stage 3 chronic kidney disease (HCC) 12/02/2017  . Left sided chest pain 07/19/2017  . Mixed hyperlipidemia 07/19/2017  . Rectal bleeding 07/26/2013  . Diverticulosis 07/26/2013  . Benign hypertension 07/26/2013  . Chronic kidney disease, stage III (moderate) (HCC) 07/26/2013  . Left sided abdominal pain 05/03/2011  . Laryngopharyngeal reflux (LPR) 05/03/2011  . Esophageal dysphagia 05/03/2011  . CONSTIPATION 02/15/2009  . HYPERLIPIDEMIA 02/14/2009  . Essential hypertension 02/14/2009    Past Surgical History:  Procedure Laterality Date  . APPENDECTOMY    . COLONOSCOPY  2007   Dr. [name], pancolonic diverticula.  . COLONOSCOPY N/A 07/28/2013   Procedure: COLONOSCOPY;  Surgeon: West Bali, MD;  Location: [facility] SUITE;  Service: Endoscopy;  Laterality: N/A;  PLEASE SCHEDULE AT 3 PM PER DR. FIELDS   . ESOPHAGOGASTRODUODENOSCOPY  05/21/2011   Dr. [name]: normal tubular esophagus, s/p empiric dilation with 29 F, innocent appearing AVM, small hiatal hernia  . GIVENS CAPSULE STUDY N/A 07/30/2013   Procedure: GIVENS CAPSULE STUDY;  Surgeon: [name], MD;  Location: [facility];  Service: Endoscopy;  Laterality: N/A;  . PORT-A-CATH REMOVAL  2008  . right ankle surgery  2007   osteomyelitis  . TUBAL LIGATION       OB History   None      Home Medications    Prior to Admission medications   Medication Sig Start Date End Date Taking? Authorizing Provider  aspirin EC 81 MG tablet Take 1 tablet (81 mg total) by mouth daily. 07/21/17 07/21/18  Catarina Hartshorn, MD  levothyroxine (SYNTHROID, LEVOTHROID) 88 MCG tablet Take 88 mcg by mouth daily. 05/28/17   [provider]  LORazepam (ATIVAN) 0.5 MG tablet Take 0.5-1 tablets by mouth 2 (two) times daily as needed for anxiety or sleep.  04/18/17   [provider]  Multiple Vitamin (MULTIVITAMIN) capsule Take 1 capsule by mouth daily.      [provider]  omeprazole (PRILOSEC) 20 MG capsule Take 20 mg by mouth daily as needed (for acid reflux).     [provider]  sertraline (ZOLOFT) 100 MG tablet Take  100 mg by mouth daily.    [provider]    Family History Family History  Problem Relation Age of Onset  . Breast cancer Mother        deceased from MI  . Stroke Father        MI too  . Colon cancer Maternal Aunt   . Rheumatic fever Sister 45       open heart surgery age 70, deceased age 29  . Lung cancer Sister   . Heart attack Sister   . Kidney disease Sister        born with one kidney, required transplant    Social History Social History   Tobacco Use  . Smoking status: Former Games developer  . Smokeless tobacco: Never Used    Substance Use Topics  . Alcohol use: No  . Drug use: No     Allergies   Hydrocodone and Codeine   Review of Systems Review of Systems  Unable to perform ROS: Acuity of condition     Physical Exam Updated Vital Signs BP 100/64   Pulse (!) 110   Temp (!) 101.8 F (38.8 C)   Resp 18   Ht 5\' 4"  (1.626 m)   Wt 45.4 kg (100 lb)   SpO2 94%   BMI 17.16 kg/m     Patient Vitals for the past 24 hrs:  BP Temp Pulse Resp SpO2 Height Weight  04/06/18 1345 (!) 95/52 98.6 F (37 C) 92 18 100 % - -  04/06/18 1330 (!) 88/58 (!) 100.4 F (38 C) 96 (!) 25 97 % - -  04/06/18 1321 (!) 89/59 (!) 100.9 F (38.3 C) (!) 102 (!) 30 95 % - -  04/06/18 1320 (!) 89/58 (!) 101.1 F (38.4 C) 97 (!) 21 91 % - -  04/06/18 1315 (!) 79/57 (!) 101.3 F (38.5 C) (!) 103 (!) 30 92 % - -  04/06/18 1305 100/64 (!) 101.8 F (38.8 C) (!) 110 18 94 % - -  04/06/18 1300 (!) 82/63 (!) 102 F (38.9 C) (!) 113 (!) 23 93 % - -  04/06/18 1235 - (!) 103.3 F (39.6 C) (!) 131 (!) 34 96 % - -  04/06/18 1230 (!) 152/123 (!) 102.2 F (39 C) (!) 140 (!) 31 98 % - -  04/06/18 1223 - - - - 96 % - -  04/06/18 1215 (!) 156/96 - - (!) 25 - 5\' 4"  (1.626 m) 45.4 kg (100 lb)     Physical Exam 1220: Physical examination:  Nursing notes reviewed; Vital signs and O2 SAT reviewed;  Constitutional: Thin, frail. Disheveled, dirty. In no acute distress; Head:  Normocephalic, Scattered bruising to forehead and scalp.; Eyes: EOMI, PERRL, No scleral icterus; ENMT: Mouth and pharynx normal, Mucous membranes dry; Neck: Supple, Full range of motion, No lymphadenopathy; Cardiovascular: Tachycardic rate and rhythm, No gallop; Respiratory: Breath sounds clear & equal bilaterally, No wheezes. Normal respiratory effort/excursion; Chest: No deformity. +bruise to upper chest wall. No abrasions. Movement normal; Abdomen: Soft, Nontender, Nondistended, Normal bowel sounds; Genitourinary: No CVA tenderness; Spine:  No midline CS, TS, LS  tenderness.;; Extremities: Peripheral pulses normal, No deformity. +scattered ecchymosis to bilat UE's and LE's. +sunburn to bilat forearms. +superficial skin tear right forearm.  No edema, No calf edema or asymmetry.; Neuro: Laying quietly with eyes open, no verbalizations, looking around room. Moves extremities minimally on stretcher spontaneously..; Skin: Color normal, Warm, Dry.   ED Treatments / Results  Labs (all labs  ordered are listed, but only abnormal results are displayed)   EKG EKG Interpretation  Date/Time:  Sunday April 06 2018 12:17:28 EDT Ventricular Rate:  152 PR Interval:    QRS Duration: 89 QT Interval:  385 QTC Calculation: 613 R Axis:   -87 Text Interpretation:  Sinus tachycardia Abnormal R-wave progression, late transition Probable left ventricular hypertrophy Inferior infarct, old When compared with ECG of 12/02/2017 Rate faster Confirmed by Samuel Jester [id number]) on 04/06/2018 12:33:54 PM   Radiology   Procedures Procedures (including critical care time)  Medications Ordered in ED Medications  sodium chloride 0.9 % bolus 1,000 mL (1,000 mLs Intravenous New Bag/Given 04/06/18 1307)  sodium chloride 0.9 % bolus 1,000 mL (has no administration in time range)  0.9 %  sodium chloride infusion ( Intravenous New Bag/Given 04/06/18 1318)  sodium chloride 0.9 % bolus 1,000 mL (0 mLs Intravenous Stopped 04/06/18 1307)     Initial Impression / Assessment and Plan / ED Course  I have reviewed the triage vital signs and the nursing notes.  Pertinent labs & imaging results that were available during my care of the patient were reviewed by me and considered in my medical decision making (see chart for details).  MDM Reviewed: previous chart, nursing note and vitals Reviewed previous: labs and ECG Interpretation: labs, ECG and x-ray Total time providing critical care: 30-74 minutes. This excludes time spent performing separately reportable procedures and  services. Consults: admitting MD   CRITICAL CARE Performed by: Laray Anger Total critical care time: 55 minutes Critical care time was exclusive of separately billable procedures and treating other patients. Critical care was necessary to treat or prevent imminent or life-threatening deterioration. Critical care was time spent personally by me on the following activities: development of treatment plan with patient and/or surrogate as well as nursing, discussions with consultants, evaluation of patient's response to treatment, examination of patient, obtaining history from patient or surrogate, ordering and performing treatments and interventions, ordering and review of laboratory studies, ordering and review of radiographic studies, pulse oximetry and re-evaluation of patient's condition.    Results for orders placed or performed during the hospital encounter of 04/06/18  Blood gas, venous  Result Value Ref Range   O2 Content 4.0 L/min   Delivery systems NASAL CANNULA    pH, Ven 7.418 7.250 - 7.430   pCO2, Ven 23.6 (L) 44.0 - 60.0 mmHg   pO2, Ven 163.0 (H) 32.0 - 45.0 mmHg   Bicarbonate 18.5 (L) 20.0 - 28.0 mmol/L   Acid-base deficit 8.7 (H) 0.0 - 2.0 mmol/L   O2 Saturation 98.7 %   Patient temperature 37.0    Collection site VENOUS    Drawn by DRAWN BY RN    Sample type VENOUS   Comprehensive metabolic panel  Result Value Ref Range   Sodium 138 135 - 145 mmol/L   Potassium 3.6 3.5 - 5.1 mmol/L   Chloride 106 98 - 111 mmol/L   CO2 15 (L) 22 - 32 mmol/L   Glucose, Bld 225 (H) 70 - 99 mg/dL   BUN 30 (H) 8 - 23 mg/dL   Creatinine, Ser 6.04 (H) 0.44 - 1.00 mg/dL   Calcium 9.9 8.9 - 54.0 mg/dL   Total Protein 6.8 6.5 - 8.1 g/dL   Albumin 3.8 3.5 - 5.0 g/dL   AST 55 (H) 15 - 41 U/L   ALT 28 0 - 44 U/L   Alkaline Phosphatase 81 38 - 126 U/L   Total Bilirubin 1.5 (H) 0.3 -  1.2 mg/dL   GFR calc non Af Amer 23 (L) >60 mL/min   GFR calc Af Amer 27 (L) >60 mL/min   Anion gap  17 (H) 5 - 15  Troponin I  Result Value Ref Range   Troponin I 0.09 (HH) <0.03 ng/mL  CBC with Differential  Result Value Ref Range   WBC 23.7 (H) 4.0 - 10.5 K/uL   RBC 4.40 3.87 - 5.11 MIL/uL   Hemoglobin 13.5 12.0 - 15.0 g/dL   HCT 16.140.1 09.636.0 - 04.546.0 %   MCV 91.1 78.0 - 100.0 fL   MCH 30.7 26.0 - 34.0 pg   MCHC 33.7 30.0 - 36.0 g/dL   RDW 40.914.2 81.111.5 - 91.415.5 %   Platelets 343 150 - 400 K/uL   Neutrophils Relative % 81 %   Lymphocytes Relative 16 %   Monocytes Relative 3 %   Eosinophils Relative 0 %   Basophils Relative 0 %   Neutro Abs 19.2 (H) 1.7 - 7.7 K/uL   Lymphs Abs 3.8 0.7 - 4.0 K/uL   Monocytes Absolute 0.7 0.1 - 1.0 K/uL   Eosinophils Absolute 0.0 0.0 - 0.7 K/uL   Basophils Absolute 0.0 0.0 - 0.1 K/uL   WBC Morphology ATYPICAL LYMPHOCYTES   Protime-INR  Result Value Ref Range   Prothrombin Time 13.9 11.4 - 15.2 seconds   INR 1.08   Acetaminophen level  Result Value Ref Range   Acetaminophen (Tylenol), Serum <10 (L) 10 - 30 ug/mL  Ethanol  Result Value Ref Range   Alcohol, Ethyl (B) <10 <10 mg/dL  Salicylate level  Result Value Ref Range   Salicylate Lvl <7.0 2.8 - 30.0 mg/dL  Urinalysis, Routine w reflex microscopic  Result Value Ref Range   Color, Urine AMBER (A) YELLOW   APPearance CLOUDY (A) CLEAR   Specific Gravity, Urine 1.017 1.005 - 1.030   pH 5.0 5.0 - 8.0   Glucose, UA NEGATIVE NEGATIVE mg/dL   Hgb urine dipstick NEGATIVE NEGATIVE   Bilirubin Urine NEGATIVE NEGATIVE   Ketones, ur 5 (A) NEGATIVE mg/dL   Protein, ur 30 (A) NEGATIVE mg/dL   Nitrite NEGATIVE NEGATIVE   Leukocytes, UA TRACE (A) NEGATIVE   RBC / HPF 0-5 0 - 5 RBC/hpf   WBC, UA 0-5 0 - 5 WBC/hpf   Bacteria, UA MANY (A) NONE SEEN   Squamous Epithelial / LPF 0-5 0 - 5   Mucus PRESENT    Budding Yeast PRESENT   Urine rapid drug screen (hosp performed)  Result Value Ref Range   Opiates NONE DETECTED NONE DETECTED   Cocaine NONE DETECTED NONE DETECTED   Benzodiazepines NONE DETECTED  NONE DETECTED   Amphetamines NONE DETECTED NONE DETECTED   Tetrahydrocannabinol NONE DETECTED NONE DETECTED   Barbiturates (A) NONE DETECTED    Result not available. Reagent lot number recalled by manufacturer.  CK  Result Value Ref Range   Total CK 98 38 - 234 U/L  I-stat chem 8, ed  Result Value Ref Range   Sodium 137 135 - 145 mmol/L   Potassium 3.5 3.5 - 5.1 mmol/L   Chloride 105 98 - 111 mmol/L   BUN 28 (H) 8 - 23 mg/dL   Creatinine, Ser 7.821.60 (H) 0.44 - 1.00 mg/dL   Glucose, Bld 956223 (H) 70 - 99 mg/dL   Calcium, Ion 2.131.29 0.861.15 - 1.40 mmol/L   TCO2 15 (L) 22 - 32 mmol/L   Hemoglobin 13.3 12.0 - 15.0 g/dL   HCT 57.839.0 46.936.0 -  46.0 %  I-Stat CG4 Lactic Acid, ED  Result Value Ref Range   Lactic Acid, Venous 2.40 (HH) 0.5 - 1.9 mmol/L   Comment MD NOTIFIED, REPEAT TEST   CBG monitoring, ED  Result Value Ref Range   Glucose-Capillary 203 (H) 70 - 99 mg/dL  I-stat troponin, ED  Result Value Ref Range   Troponin i, poc 0.08 0.00 - 0.08 ng/mL   Comment 3           Dg Chest Port 1 View Result Date: 04/06/2018 CLINICAL DATA:  Acute mental status change.  Found down. EXAM: PORTABLE CHEST 1 VIEW COMPARISON:  December 02, 2017 FINDINGS: No pneumothorax. There is a torturous thoracic aorta which is unchanged given portable technique. The cardiomediastinal silhouette is stable. No pulmonary nodules or masses. No focal infiltrates. No overt edema. IMPRESSION: No active disease. Electronically Signed   By: Gerome Sam III M.D   On: 04/06/2018 13:10   Ct Head Wo Contrast Result Date: 04/06/2018 CLINICAL DATA:  Patient found down.  Trauma.  Altered mental status. EXAM: CT HEAD WITHOUT CONTRAST CT CERVICAL SPINE WITHOUT CONTRAST TECHNIQUE: Multidetector CT imaging of the head and cervical spine was performed following the standard protocol without intravenous contrast. Multiplanar CT image reconstructions of the cervical spine were also generated. COMPARISON:  None. FINDINGS: CT HEAD FINDINGS  Brain: No subdural, epidural, or subarachnoid hemorrhage. Scattered white matter changes are centrally stable. A right thalamic lacunar infarct is stable. The ventricles and sulci are unchanged. Cerebellum, brainstem, and basal cisterns are normal. No mass effect or midline shift. Vascular: Calcified atherosclerosis in the intracranial carotids. Skull: Normal. Negative for fracture or focal lesion. Sinuses/Orbits: A tiny amount of fluid is seen posteriorly in both maxillary sinuses. Paranasal sinuses, mastoid air cells, and middle ears are otherwise normal in well aerated. Other: None. CT CERVICAL SPINE FINDINGS Alignment: Normal. Skull base and vertebrae: Incomplete fusion of the left side of the C1 posterior arch, unchanged. No acute fracture. Soft tissues and spinal canal: No prevertebral fluid or swelling. No visible canal hematoma. Disc levels: Mild degenerative changes most prominent at C5-6 with tiny anterior and posterior osteophytes. The upper cervical spine facet degenerative changes noted. Upper chest: Negative. Other: No other abnormalities identified. IMPRESSION: 1. Chronic white matter changes in the brain. No acute intracranial abnormality. 2. There is a tiny amount of fluid in the posterior maxillary sinuses which is nonspecific. Recommend clinical correlation. 3. No fracture or traumatic malalignment in the cervical spine. Mild degenerative changes. Electronically Signed   By: Gerome Sam III M.D   On: 04/06/2018 13:37   Ct Cervical Spine Wo Contrast Result Date: 04/06/2018 CLINICAL DATA:  Patient found down.  Trauma.  Altered mental status. EXAM: CT HEAD WITHOUT CONTRAST CT CERVICAL SPINE WITHOUT CONTRAST TECHNIQUE: Multidetector CT imaging of the head and cervical spine was performed following the standard protocol without intravenous contrast. Multiplanar CT image reconstructions of the cervical spine were also generated. COMPARISON:  None. FINDINGS: CT HEAD FINDINGS Brain: No subdural,  epidural, or subarachnoid hemorrhage. Scattered white matter changes are centrally stable. A right thalamic lacunar infarct is stable. The ventricles and sulci are unchanged. Cerebellum, brainstem, and basal cisterns are normal. No mass effect or midline shift. Vascular: Calcified atherosclerosis in the intracranial carotids. Skull: Normal. Negative for fracture or focal lesion. Sinuses/Orbits: A tiny amount of fluid is seen posteriorly in both maxillary sinuses. Paranasal sinuses, mastoid air cells, and middle ears are otherwise normal in well aerated. Other: None. CT CERVICAL  SPINE FINDINGS Alignment: Normal. Skull base and vertebrae: Incomplete fusion of the left side of the C1 posterior arch, unchanged. No acute fracture. Soft tissues and spinal canal: No prevertebral fluid or swelling. No visible canal hematoma. Disc levels: Mild degenerative changes most prominent at C5-6 with tiny anterior and posterior osteophytes. The upper cervical spine facet degenerative changes noted. Upper chest: Negative. Other: No other abnormalities identified. IMPRESSION: 1. Chronic white matter changes in the brain. No acute intracranial abnormality. 2. There is a tiny amount of fluid in the posterior maxillary sinuses which is nonspecific. Recommend clinical correlation. 3. No fracture or traumatic malalignment in the cervical spine. Mild degenerative changes. Electronically Signed   By: Gerome Sam III M.D   On: 04/06/2018 13:37      1230:  On arrival, pt awake, looking around room, not responding purposely to ED staff. Pt sunburned, disheveled. Temp foley placed, +fever. Given pt's hx of being found outside in heat, likely heat related vs sepsis. All sepsis labs obtained, however. Cooling blanket placed. IVF boluses ordered for clinical dehydration and tachycardia on monitor.   1300:  Pt more awake/alert now. Talking with ED staff, "I'm about to freeze." Temp and tachycardia significantly improving with IVF boluses.  Minimal dark urine in foley bag.  BP now becoming soft. IVF NS 30mg /kg boluses infusing.   1345:  Udip with UTI; UC and BC pending, IV rocephin ordered. IVF continues. Pt interacting with family at bedside, state pt is now acting her baseline (dementia). T/C returned from [facility]. Ardyth Harps, case discussed, including:  HPI, pertinent PM/SHx, VS/PE, dx testing, ED course and treatment:  Agreeable to admit.        Final Clinical Impressions(s) / ED Diagnoses   Final diagnoses:  None    ED Discharge Orders    None       Samuel Jester, DO 04/10/18 1440

## 2018-04-06 NOTE — ED Notes (Signed)
Pt more alert at this time stating to this RN, "i'm about to freeze". Pt states name, DOB, and year correctly.

## 2018-04-07 DIAGNOSIS — N183 Chronic kidney disease, stage 3 (moderate): Secondary | ICD-10-CM

## 2018-04-07 DIAGNOSIS — T670XXD Heatstroke and sunstroke, subsequent encounter: Secondary | ICD-10-CM

## 2018-04-07 LAB — BASIC METABOLIC PANEL
ANION GAP: 9 (ref 5–15)
BUN: 25 mg/dL — AB (ref 8–23)
CO2: 17 mmol/L — AB (ref 22–32)
Calcium: 7.6 mg/dL — ABNORMAL LOW (ref 8.9–10.3)
Chloride: 114 mmol/L — ABNORMAL HIGH (ref 98–111)
Creatinine, Ser: 1.33 mg/dL — ABNORMAL HIGH (ref 0.44–1.00)
GFR calc Af Amer: 41 mL/min — ABNORMAL LOW (ref >60)
GFR calc non Af Amer: 36 mL/min — ABNORMAL LOW (ref >60)
GLUCOSE: 82 mg/dL (ref 70–99)
POTASSIUM: 2.9 mmol/L — AB (ref 3.5–5.1)
Sodium: 140 mmol/L (ref 135–145)

## 2018-04-07 LAB — CBC
HEMATOCRIT: 29.5 % — AB (ref 36.0–46.0)
HEMOGLOBIN: 9.7 g/dL — AB (ref 12.0–15.0)
MCH: 30.4 pg (ref 26.0–34.0)
MCHC: 32.9 g/dL (ref 30.0–36.0)
MCV: 92.5 fL (ref 78.0–100.0)
Platelets: 115 10*3/uL — ABNORMAL LOW (ref 150–400)
RBC: 3.19 MIL/uL — ABNORMAL LOW (ref 3.87–5.11)
RDW: 14.5 % (ref 11.5–15.5)
WBC: 5.3 10*3/uL (ref 4.0–10.5)

## 2018-04-07 LAB — MAGNESIUM: Magnesium: 1.8 mg/dL (ref 1.7–2.4)

## 2018-04-07 MED ORDER — POTASSIUM CHLORIDE CRYS ER 20 MEQ PO TBCR
40.0000 meq | EXTENDED_RELEASE_TABLET | ORAL | Status: DC
  Administered 2018-04-07 (×2): 40 meq via ORAL
  Filled 2018-04-07 (×2): qty 2

## 2018-04-07 MED ORDER — ENSURE ENLIVE PO LIQD
237.0000 mL | Freq: Three times a day (TID) | ORAL | Status: DC
  Administered 2018-04-07: 237 mL via ORAL

## 2018-04-07 MED ORDER — POTASSIUM CHLORIDE CRYS ER 20 MEQ PO TBCR: 20.0000 meq | EXTENDED_RELEASE_TABLET | Freq: Two times a day (BID) | ORAL | 0 refills | Status: DC

## 2018-04-07 NOTE — Discharge Summary (Addendum)
Physician Discharge Summary  Robin Grant ZOX:[id number] DOB: May 24, 1931 DOA: 04/06/2018  PCP: Carylon PerchesFagan, Roy, MD  Admit date: 04/06/2018 Discharge date: 04/07/2018  Time spent: 45 minutes  Recommendations for Outpatient Follow-up:  -To be discharged home today. -Advise follow-up with PCP in 2 weeks. -Has been evaluated by physical therapy and as such home health PT services will be arranged prior to discharge home.  Discharge Diagnoses:  Principal Problem:   Heat stroke and sunstroke Active Problems:   Acute lower UTI   Hyperlipidemia   Essential hypertension   Chronic kidney disease, stage III (moderate) (HCC)   Mixed hyperlipidemia   Hypothyroidism Severe Protein-Caloric Malnutrition  Discharge Condition: Stable and improved  Filed Weights   04/06/18 1524 04/06/18 1814 04/07/18 0500  Weight: 44.3 kg (97 lb 10.6 oz) 44.3 kg (97 lb 10.6 oz) 44.3 kg (97 lb 10.6 oz)    History of present illness:   Robin Grant is a 84 y.o. female with past medical history significant for hyperlipidemia, hypothyroidism, question dementia who was found outside by the neighbor in the heat.  Details are unclear as there are no family or friends at bedside and patient still remains somewhat confused.  The neighbor found her outside delirious, slumped on her side, with a visible sunburn, bruises to her left temple, center of her chest both arms and right leg.  It is unclear how long she had been out in the heat.  Upon EMS arrival it appears they thought she might have been in V. tach and she received an amiodarone bolus and cardioversion, subsequent evaluation of strips shows sinus tachycardia. She is brought into the hospital for evaluation.  She was found to have a temperature of 104 that persisted for a few hours.  Labs showed creatinine of 1.89 which is around her baseline, gap acidosis with a bicarb of 15 and a gap of 17, minimally elevated troponin to 0.09, lactic acid was elevated at 2.4, WBCs are  23.7.  Chest x-ray shows no active disease, CT scan of the head and cervical spine shows chronic white matter changes in the brain with no acute intracranial abnormalities.  No fracture or malalignment of the cervical spine with mild degenerative changes.  UA shows many bacteria but negative nitrate and only 0-3 WBCs.  Admission is requested for further evaluation and management.    Hospital Course:   Heatstroke -She did indeed meet SIRS criteria on admission, mainly tachycardia and fever; I suspect mainly due to heatstroke and not due to true sepsis. -She responded well to IV fluids and Tylenol. -She received Rocephin for a questionable UTI, which I do not believe she has, culture data is pending at time of discharge but I will not discharge her on antibiotics as I do not believe this is a true diagnosis. -Patient has been advised to drink plenty of fluids throughout the summer and to avoid sitting in direct sun and heat. -Due to her multiple frequent falls, we have consulted physical therapy who is recommending home health PT and that will be scheduled prior to discharge.  Hypothyroidism -Continue home dose of Synthroid.  Hyperlipidemia -Continue Crestor  Chronic kidney disease stage III -Creatinine remains at baseline of around 1.6-1.8.  Procedures:  None   Consultations:  None  Discharge Instructions  Discharge Instructions    Diet - low sodium heart healthy   Complete by:  As directed    Increase activity slowly   Complete by:  As directed  Allergies as of 04/07/2018      Reactions   Hydrocodone Nausea And Vomiting   Codeine Nausea And Vomiting      Medication List    TAKE these medications   aspirin EC 81 MG tablet Take 1 tablet (81 mg total) by mouth daily.   donepezil 5 MG tablet Commonly known as:  ARICEPT Take 5 mg by mouth daily.   levothyroxine 88 MCG tablet Commonly known as:  SYNTHROID, LEVOTHROID Take 88 mcg by mouth daily.   LORazepam 0.5  MG tablet Commonly known as:  ATIVAN Take 0.5-1 tablets by mouth 2 (two) times daily as needed for anxiety or sleep.   Melatonin 5 MG Tabs Take 1 tablet by mouth daily.   multivitamin capsule Take 1 capsule by mouth daily.   ondansetron 4 MG tablet Commonly known as:  ZOFRAN Take 4 mg by mouth every 8 (eight) hours as needed for nausea or vomiting.   potassium chloride SA 20 MEQ tablet Commonly known as:  K-DUR,KLOR-CON Take 1 tablet (20 mEq total) by mouth 2 (two) times daily for 2 days.   rosuvastatin 10 MG tablet Commonly known as:  CRESTOR Take 10 mg by mouth daily.   sertraline 100 MG tablet Commonly known as:  ZOLOFT Take 100 mg by mouth daily.      Allergies  Allergen Reactions  . Hydrocodone Nausea And Vomiting  . Codeine Nausea And Vomiting   Follow-up Information    [name], MD. Schedule an appointment as soon as possible for a visit in 2 week(s).   Specialty:  Internal Medicine Contact information: [address] [place] [address] [phone]            The results of significant diagnostics from this hospitalization (including imaging, microbiology, ancillary and laboratory) are listed below for reference.    Significant Diagnostic Studies: Dg Ankle 2 Views Left  Result Date: 04/06/2018 CLINICAL DATA:  Left ankle pain common no known injury, initial encounter EXAM: LEFT ANKLE - 2 VIEW COMPARISON:  None. FINDINGS: There is no evidence of fracture, dislocation, or joint effusion. There is no evidence of arthropathy or other focal bone abnormality. Soft tissues are unremarkable. IMPRESSION: No acute abnormality noted. Electronically Signed   By: Alcide Clever M.D.   On: 04/06/2018 20:43   Ct Head Wo Contrast  Result Date: 04/06/2018 CLINICAL DATA:  Patient found down.  Trauma.  Altered mental status. EXAM: CT HEAD WITHOUT CONTRAST CT CERVICAL SPINE WITHOUT CONTRAST TECHNIQUE: Multidetector CT imaging of the head and cervical spine was  performed following the standard protocol without intravenous contrast. Multiplanar CT image reconstructions of the cervical spine were also generated. COMPARISON:  None. FINDINGS: CT HEAD FINDINGS Brain: No subdural, epidural, or subarachnoid hemorrhage. Scattered white matter changes are centrally stable. A right thalamic lacunar infarct is stable. The ventricles and sulci are unchanged. Cerebellum, brainstem, and basal cisterns are normal. No mass effect or midline shift. Vascular: Calcified atherosclerosis in the intracranial carotids. Skull: Normal. Negative for fracture or focal lesion. Sinuses/Orbits: A tiny amount of fluid is seen posteriorly in both maxillary sinuses. Paranasal sinuses, mastoid air cells, and middle ears are otherwise normal in well aerated. Other: None. CT CERVICAL SPINE FINDINGS Alignment: Normal. Skull base and vertebrae: Incomplete fusion of the left side of the C1 posterior arch, unchanged. No acute fracture. Soft tissues and spinal canal: No prevertebral fluid or swelling. No visible canal hematoma. Disc levels: Mild degenerative changes most prominent at C5-6 with tiny anterior and  posterior osteophytes. The upper cervical spine facet degenerative changes noted. Upper chest: Negative. Other: No other abnormalities identified. IMPRESSION: 1. Chronic white matter changes in the brain. No acute intracranial abnormality. 2. There is a tiny amount of fluid in the posterior maxillary sinuses which is nonspecific. Recommend clinical correlation. 3. No fracture or traumatic malalignment in the cervical spine. Mild degenerative changes. Electronically Signed   By: Gerome Sam III M.D   On: 04/06/2018 13:37   Ct Cervical Spine Wo Contrast  Result Date: 04/06/2018 CLINICAL DATA:  Patient found down.  Trauma.  Altered mental status. EXAM: CT HEAD WITHOUT CONTRAST CT CERVICAL SPINE WITHOUT CONTRAST TECHNIQUE: Multidetector CT imaging of the head and cervical spine was performed following  the standard protocol without intravenous contrast. Multiplanar CT image reconstructions of the cervical spine were also generated. COMPARISON:  None. FINDINGS: CT HEAD FINDINGS Brain: No subdural, epidural, or subarachnoid hemorrhage. Scattered white matter changes are centrally stable. A right thalamic lacunar infarct is stable. The ventricles and sulci are unchanged. Cerebellum, brainstem, and basal cisterns are normal. No mass effect or midline shift. Vascular: Calcified atherosclerosis in the intracranial carotids. Skull: Normal. Negative for fracture or focal lesion. Sinuses/Orbits: A tiny amount of fluid is seen posteriorly in both maxillary sinuses. Paranasal sinuses, mastoid air cells, and middle ears are otherwise normal in well aerated. Other: None. CT CERVICAL SPINE FINDINGS Alignment: Normal. Skull base and vertebrae: Incomplete fusion of the left side of the C1 posterior arch, unchanged. No acute fracture. Soft tissues and spinal canal: No prevertebral fluid or swelling. No visible canal hematoma. Disc levels: Mild degenerative changes most prominent at C5-6 with tiny anterior and posterior osteophytes. The upper cervical spine facet degenerative changes noted. Upper chest: Negative. Other: No other abnormalities identified. IMPRESSION: 1. Chronic white matter changes in the brain. No acute intracranial abnormality. 2. There is a tiny amount of fluid in the posterior maxillary sinuses which is nonspecific. Recommend clinical correlation. 3. No fracture or traumatic malalignment in the cervical spine. Mild degenerative changes. Electronically Signed   By: Gerome Sam III M.D   On: 04/06/2018 13:37   Dg Chest Port 1 View  Result Date: 04/06/2018 CLINICAL DATA:  Acute mental status change.  Found down. EXAM: PORTABLE CHEST 1 VIEW COMPARISON:  December 02, 2017 FINDINGS: No pneumothorax. There is a torturous thoracic aorta which is unchanged given portable technique. The cardiomediastinal  silhouette is stable. No pulmonary nodules or masses. No focal infiltrates. No overt edema. IMPRESSION: No active disease. Electronically Signed   By: Gerome Sam III M.D   On: 04/06/2018 13:10    Microbiology: Recent Results (from the past 240 hour(s))  Urine culture     Status: Abnormal (Preliminary result)   Collection Time: 04/06/18 12:27 PM  Result Value Ref Range Status   Specimen Description   Final    URINE, CLEAN CATCH Performed at [facility], [address] [place], [place] [address]    Special Requests   Final    NONE Performed at [facility], [address], [place], [place] [address]    Culture >=100,000 COLONIES/mL ESCHERICHIA COLI (A)  Final   Report Status PENDING  Incomplete  Culture, blood (routine x 2)     Status: None (Preliminary result)   Collection Time: 04/06/18  1:02 PM  Result Value Ref Range Status   Specimen Description BLOOD LEFT ARM  Final   Special Requests   Final    BOTTLES DRAWN AEROBIC AND ANAEROBIC Blood Culture adequate  volume   Culture   Final    NO GROWTH < 24 HOURS Performed at [facility], [address], [place], [place] [address]    Report Status PENDING  Incomplete  Culture, blood (routine x 2)     Status: None (Preliminary result)   Collection Time: 04/06/18  1:13 PM  Result Value Ref Range Status   Specimen Description BLOOD LEFT WRIST  Final   Special Requests   Final    BOTTLES DRAWN AEROBIC AND ANAEROBIC Blood Culture adequate volume   Culture   Final    NO GROWTH < 24 HOURS Performed at [facility], [address] [place], [place] [address]    Report Status PENDING  Incomplete  MRSA PCR Screening     Status: None   Collection Time: 04/06/18  3:10 PM  Result Value Ref Range Status   MRSA by PCR NEGATIVE NEGATIVE Final    Comment:        The GeneXpert MRSA Assay (FDA approved for NASAL specimens only), is one component of a comprehensive MRSA colonization surveillance program. It is not intended to diagnose  MRSA infection nor to guide or monitor treatment for MRSA infections. Performed at [facility], [address], [place], [place] [address]      Labs: Basic Metabolic Panel: Recent Labs  Lab 04/06/18 1219 04/06/18 1227 04/07/18 0408 04/07/18 0855  NA 137 138 140  --   K 3.5 3.6 2.9*  --   CL 105 106 114*  --   CO2  --  15* 17*  --   GLUCOSE 223* 225* 82  --   BUN 28* 30* 25*  --   CREATININE 1.60* 1.89* 1.33*  --   CALCIUM  --  9.9 7.6*  --   MG  --   --   --  1.8   Liver Function Tests: Recent Labs  Lab 04/06/18 1227  AST 55*  ALT 28  ALKPHOS 81  BILITOT 1.5*  PROT 6.8  ALBUMIN 3.8   No results for input(s): LIPASE, AMYLASE in the last 168 hours. No results for input(s): AMMONIA in the last 168 hours. CBC: Recent Labs  Lab 04/06/18 1219 04/06/18 1227 04/07/18 0408  WBC  --  23.7* 5.3  NEUTROABS  --  19.2*  --   HGB 13.3 13.5 9.7*  HCT 39.0 40.1 29.5*  MCV  --  91.1 92.5  PLT  --  343 115*   Cardiac Enzymes: Recent Labs  Lab 04/06/18 1227  CKTOTAL 98  TROPONINI 0.09*   BNP: BNP (last 3 results) No results for input(s): BNP in the last 8760 hours.  ProBNP (last 3 results) No results for input(s): PROBNP in the last 8760 hours.  CBG: Recent Labs  Lab 04/06/18 1227  GLUCAP 203*       Signed:  Chaya Jan  [facility] Pager: [phone] 04/07/2018, 4:30 PM

## 2018-04-07 NOTE — Care Management Note (Addendum)
Case Management Note  Patient Details  Name: Robin Grant MRN: [id number] Date of Birth: 1930-10-27  Subjective/Objective:  Heat stroke. From home alone. Has a family friend [place] Reportedly they check on her daily. Independent with ADL's.   Action/Plan:  Patient has had [facility] in the past and is agreeable again.  CM will follow.  PT eval pending.   Expected Discharge Date:    04/07/2018              Expected Discharge Plan:     In-House Referral:     Discharge planning Services  CM Consult  Post Acute Care Choice:  Home Health Choice offered to:  Patient  DME Arranged:    DME Agency:     HH Arranged:    HH Agency:     Status of Service:  In process, will continue to follow  If discussed at Long Length of Stay Meetings, dates discussed:    Additional Comments:  Terree Gaultney, Chrystine OilerSharley Diane, RN 04/07/2018, 2:14 PM

## 2018-04-07 NOTE — Evaluation (Signed)
Physical Therapy Evaluation Patient Details Name: Robin Grant MRN: [id number] DOB: Oct 12, 1930 Today's Date: 04/07/2018   History of Present Illness  Robin Grant is a 84 y.o. female with past medical history significant for hyperlipidemia, hypothyroidism, question dementia who was found outside by the neighbor in the heat.  Details are unclear as there are no family or friends at bedside and patient still remains somewhat confused.  The neighbor found her outside delirious, slumped on her side, with a visible sunburn, bruises to her left temple, center of her chest both arms and right leg.  It is unclear how long she had been out in the heat.  Upon EMS arrival it appears they thought she might have been in V. tach and she received an amiodarone bolus and cardioversion, subsequent evaluation of strips shows sinus tachycardia. She is brought into the hospital for evaluation.  She was found to have a temperature of 104 that persisted for a few hours.  Labs showed creatinine of 1.89 which is around her baseline, gap acidosis with a bicarb of 15 and a gap of 17, minimally elevated troponin to 0.09, lactic acid was elevated at 2.4, WBCs are 23.7.  Chest x-ray shows no active disease, CT scan of the head and cervical spine shows chronic white matter changes in the brain with no acute intracranial abnormalities.  No fracture or malalignment of the cervical spine with mild degenerative changes.  UA shows many bacteria but negative nitrate and only 0-3 WBCs.  Admission is requested for further evaluation and management.  Clinical Impression  PT is able to get out of bed, ambulate with a walker and get in and out of a chair with moderate independence.  Recommend life alert for use at home as well as home health PT to ensure safety of the home.     Follow Up Recommendations Home health PT    Equipment Recommendations  None recommended by PT;Other (comment)(life alert )    Recommendations for Other Services    nA     Precautions / Restrictions Precautions Precautions: None Restrictions Weight Bearing Restrictions: No      Mobility  Bed Mobility Overal bed mobility: Modified Independent                Transfers Overall transfer level: Modified independent Equipment used: Rolling walker (2 wheeled)                Ambulation/Gait Ambulation/Gait assistance: Modified independent (Device/Increase time) Gait Distance (Feet): 200 Feet Assistive device: Rolling walker (2 wheeled) Gait Pattern/deviations: WFL(Within Functional Limits)        Stairs            Wheelchair Mobility    Modified Rankin (Stroke Patients Only)             Pertinent Vitals/Pain Pain Assessment: No/denies pain    Home Living Family/patient expects to be discharged to:: Private residence Living Arrangements: Alone Available Help at Discharge: Family Type of Home: House Home Access: Stairs to enter     Home Layout: One level Home Equipment: Environmental consultant - 2 wheels;Cane - single point;Hospital bed      Prior Function Level of Independence: Independent with assistive device(s)               Hand Dominance        Extremity/Trunk Assessment        Lower Extremity Assessment Lower Extremity Assessment: Overall WFL for tasks assessed       Communication  Communication: No difficulties  Cognition Arousal/Alertness: Awake/alert   Overall Cognitive Status: Within Functional Limits for tasks assessed                                               Assessment/Plan    PT Assessment All further PT needs can be met in the next venue of care  PT Problem List Decreased balance               End of Session Equipment Utilized During Treatment: Gait belt Activity Tolerance: Patient tolerated treatment well Patient left: in chair;with family/visitor present Nurse Communication: Mobility status      Time: 1530-1553 PT Time Calculation (min)  (ACUTE ONLY): 23 min   Charges:   PT Evaluation $PT Eval Low Complexity: [place], PT CLT [phone] 04/07/2018, 3:53 PM

## 2018-04-07 NOTE — Progress Notes (Signed)
Initial Nutrition Assessment  DOCUMENTATION CODES:   Severe malnutrition in context of chronic illness, Underweight  INTERVENTION:   - Ensure Enlive po TID, each supplement provides 350 kcal and 20 grams of protein  - Encourage PO intake  NUTRITION DIAGNOSIS:   Severe Malnutrition related to chronic illness, other (see comment)(dementia, pt lives alone) as evidenced by moderate fat depletion, severe fat depletion, moderate muscle depletion, severe muscle depletion, percent weight loss(21.3% weight loss in less than 9 months).  GOAL:   Patient will meet greater than or equal to 90% of their needs  MONITOR:   PO intake, Supplement acceptance, Diet advancement, Labs, Weight trends  REASON FOR ASSESSMENT:   Other (Comment) (underweight BMI)    ASSESSMENT:   84 year old female who presented to the ED after being found outside on the porch by her neighbor. PMH significant for hyperlipidemia, hypertension, hypothyroidism, and dementia.  Spoke with pt and youngest son at bedside. When asked about her PO intake PTA, pt reports it "was not real good." Pt's son endorses this. Pt sates "I can't eat much" and reports this has been going on for at least a couple of months. Pt reports this is due to her feeling nauseous prior to eating which happens "about every time" she tries to eat. Despite feelings of nausea and lack of appetite, pt reports consuming 3 meals daily. A meal might include eggs, bacon, and toast. Pt may eat half of that meal.  Pt mainly drinks water and has at least 1 oral nutrition supplement daily. Pt cannot remember the name. Pt agreeable to trying Ensure Enlive during current admission. RD to order.  Pt states she did "right good" with lunch meal. No meal completion charted at this time. Pt reports having gravy and creamed potatoes.  Pt endorses weight loss, stating that her UBW is 12-15 lbs heavier than her current weight. Per weight history in chart, pt has lost 26.4  lbs since 07/19/17. This is a 21.3% weight loss in less than 9 months which is significant for timeframe.  Medications reviewed and include: 88 mcg levothyroxine daily, MVI with minerals daily, 40 mEq K-dur x 3 runs today  Labs reviewed: potassium 2.9 (L), chloride 114 (H), BUN 25 (H), creatinine 1.33 (H), calcium 7.6 (L), hemoglobin 9.7 (L), HCT 29.5 (L)  I/O's: +1.6 L since admission  NUTRITION - FOCUSED PHYSICAL EXAM:    Most Recent Value  Orbital Region  Moderate depletion  Upper Arm Region  Severe depletion  Thoracic and Lumbar Region  Severe depletion  Buccal Region  Severe depletion  Temple Region  Moderate depletion  Clavicle Bone Region  Moderate depletion  Clavicle and Acromion Bone Region  Severe depletion  Scapular Bone Region  Unable to assess  Dorsal Hand  Moderate depletion  Patellar Region  Severe depletion  Anterior Thigh Region  Severe depletion  Posterior Calf Region  Severe depletion  Edema (RD Assessment)  None  Hair  Reviewed  Eyes  Reviewed  Mouth  Reviewed  Skin  Reviewed  Nails  Reviewed       Diet Order:   Diet Order           DIET SOFT Room service appropriate? Yes; Fluid consistency: Thin  Diet effective now          EDUCATION NEEDS:   No education needs have been identified at this time  Skin:  Skin Assessment: Reviewed RN Assessment  Last BM:  unknown/PTA  Height:   Ht Readings from Last 1  Encounters:  04/06/18 5\' 2"  (1.575 m)    Weight:   Wt Readings from Last 1 Encounters:  04/07/18 97 lb 10.6 oz (44.3 kg)    Ideal Body Weight:  50 kg  BMI:  Body mass index is 17.86 kg/m.  Estimated Nutritional Needs:   Kcal:  1300-1500 kcal/day  Protein:  55-70 grams/day  Fluid:  1.3-1.5 L/day    Earma Reading, MS, RD, LDN Pager: [phone] Weekend/After Hours: [phone]

## 2018-04-08 DIAGNOSIS — T670XXD Heatstroke and sunstroke, subsequent encounter: Secondary | ICD-10-CM | POA: Diagnosis not present

## 2018-04-08 DIAGNOSIS — S51801D Unspecified open wound of right forearm, subsequent encounter: Secondary | ICD-10-CM | POA: Diagnosis not present

## 2018-04-08 DIAGNOSIS — E782 Mixed hyperlipidemia: Secondary | ICD-10-CM | POA: Diagnosis not present

## 2018-04-08 DIAGNOSIS — E039 Hypothyroidism, unspecified: Secondary | ICD-10-CM | POA: Diagnosis not present

## 2018-04-08 DIAGNOSIS — S81801D Unspecified open wound, right lower leg, subsequent encounter: Secondary | ICD-10-CM | POA: Diagnosis not present

## 2018-04-08 DIAGNOSIS — F0390 Unspecified dementia without behavioral disturbance: Secondary | ICD-10-CM | POA: Diagnosis not present

## 2018-04-08 DIAGNOSIS — N390 Urinary tract infection, site not specified: Secondary | ICD-10-CM | POA: Diagnosis not present

## 2018-04-08 DIAGNOSIS — N183 Chronic kidney disease, stage 3 (moderate): Secondary | ICD-10-CM | POA: Diagnosis not present

## 2018-04-08 DIAGNOSIS — I129 Hypertensive chronic kidney disease with stage 1 through stage 4 chronic kidney disease, or unspecified chronic kidney disease: Secondary | ICD-10-CM | POA: Diagnosis not present

## 2018-04-08 LAB — URINE CULTURE: Culture: >=100000 — AB

## 2018-04-10 DIAGNOSIS — E782 Mixed hyperlipidemia: Secondary | ICD-10-CM | POA: Diagnosis not present

## 2018-04-10 DIAGNOSIS — E039 Hypothyroidism, unspecified: Secondary | ICD-10-CM | POA: Diagnosis not present

## 2018-04-10 DIAGNOSIS — F0390 Unspecified dementia without behavioral disturbance: Secondary | ICD-10-CM | POA: Diagnosis not present

## 2018-04-10 DIAGNOSIS — I129 Hypertensive chronic kidney disease with stage 1 through stage 4 chronic kidney disease, or unspecified chronic kidney disease: Secondary | ICD-10-CM | POA: Diagnosis not present

## 2018-04-10 DIAGNOSIS — N390 Urinary tract infection, site not specified: Secondary | ICD-10-CM | POA: Diagnosis not present

## 2018-04-10 DIAGNOSIS — S81801D Unspecified open wound, right lower leg, subsequent encounter: Secondary | ICD-10-CM | POA: Diagnosis not present

## 2018-04-10 DIAGNOSIS — N183 Chronic kidney disease, stage 3 (moderate): Secondary | ICD-10-CM | POA: Diagnosis not present

## 2018-04-10 DIAGNOSIS — S51801D Unspecified open wound of right forearm, subsequent encounter: Secondary | ICD-10-CM | POA: Diagnosis not present

## 2018-04-10 DIAGNOSIS — T670XXD Heatstroke and sunstroke, subsequent encounter: Secondary | ICD-10-CM | POA: Diagnosis not present

## 2018-04-11 DIAGNOSIS — T670XXA Heatstroke and sunstroke, initial encounter: Secondary | ICD-10-CM | POA: Diagnosis not present

## 2018-04-11 DIAGNOSIS — B9620 Unspecified Escherichia coli [E. coli] as the cause of diseases classified elsewhere: Secondary | ICD-10-CM | POA: Diagnosis not present

## 2018-04-11 DIAGNOSIS — E876 Hypokalemia: Secondary | ICD-10-CM | POA: Diagnosis not present

## 2018-04-11 LAB — CULTURE, BLOOD (ROUTINE X 2)
CULTURE: NO GROWTH
Culture: NO GROWTH
SPECIAL REQUESTS: ADEQUATE
SPECIAL REQUESTS: ADEQUATE

## 2018-04-14 DIAGNOSIS — S51801D Unspecified open wound of right forearm, subsequent encounter: Secondary | ICD-10-CM | POA: Diagnosis not present

## 2018-04-14 DIAGNOSIS — T670XXD Heatstroke and sunstroke, subsequent encounter: Secondary | ICD-10-CM | POA: Diagnosis not present

## 2018-04-14 DIAGNOSIS — F0390 Unspecified dementia without behavioral disturbance: Secondary | ICD-10-CM | POA: Diagnosis not present

## 2018-04-14 DIAGNOSIS — E782 Mixed hyperlipidemia: Secondary | ICD-10-CM | POA: Diagnosis not present

## 2018-04-14 DIAGNOSIS — E039 Hypothyroidism, unspecified: Secondary | ICD-10-CM | POA: Diagnosis not present

## 2018-04-14 DIAGNOSIS — N183 Chronic kidney disease, stage 3 (moderate): Secondary | ICD-10-CM | POA: Diagnosis not present

## 2018-04-14 DIAGNOSIS — I129 Hypertensive chronic kidney disease with stage 1 through stage 4 chronic kidney disease, or unspecified chronic kidney disease: Secondary | ICD-10-CM | POA: Diagnosis not present

## 2018-04-14 DIAGNOSIS — S81801D Unspecified open wound, right lower leg, subsequent encounter: Secondary | ICD-10-CM | POA: Diagnosis not present

## 2018-04-14 DIAGNOSIS — N390 Urinary tract infection, site not specified: Secondary | ICD-10-CM | POA: Diagnosis not present

## 2018-04-15 DIAGNOSIS — F0390 Unspecified dementia without behavioral disturbance: Secondary | ICD-10-CM | POA: Diagnosis not present

## 2018-04-15 DIAGNOSIS — E039 Hypothyroidism, unspecified: Secondary | ICD-10-CM | POA: Diagnosis not present

## 2018-04-15 DIAGNOSIS — N183 Chronic kidney disease, stage 3 (moderate): Secondary | ICD-10-CM | POA: Diagnosis not present

## 2018-04-15 DIAGNOSIS — S81801D Unspecified open wound, right lower leg, subsequent encounter: Secondary | ICD-10-CM | POA: Diagnosis not present

## 2018-04-15 DIAGNOSIS — T670XXD Heatstroke and sunstroke, subsequent encounter: Secondary | ICD-10-CM | POA: Diagnosis not present

## 2018-04-15 DIAGNOSIS — N390 Urinary tract infection, site not specified: Secondary | ICD-10-CM | POA: Diagnosis not present

## 2018-04-15 DIAGNOSIS — E782 Mixed hyperlipidemia: Secondary | ICD-10-CM | POA: Diagnosis not present

## 2018-04-15 DIAGNOSIS — I129 Hypertensive chronic kidney disease with stage 1 through stage 4 chronic kidney disease, or unspecified chronic kidney disease: Secondary | ICD-10-CM | POA: Diagnosis not present

## 2018-04-15 DIAGNOSIS — S51801D Unspecified open wound of right forearm, subsequent encounter: Secondary | ICD-10-CM | POA: Diagnosis not present

## 2018-04-16 DIAGNOSIS — N390 Urinary tract infection, site not specified: Secondary | ICD-10-CM | POA: Diagnosis not present

## 2018-04-16 DIAGNOSIS — I129 Hypertensive chronic kidney disease with stage 1 through stage 4 chronic kidney disease, or unspecified chronic kidney disease: Secondary | ICD-10-CM | POA: Diagnosis not present

## 2018-04-16 DIAGNOSIS — F0390 Unspecified dementia without behavioral disturbance: Secondary | ICD-10-CM | POA: Diagnosis not present

## 2018-04-16 DIAGNOSIS — T670XXD Heatstroke and sunstroke, subsequent encounter: Secondary | ICD-10-CM | POA: Diagnosis not present

## 2018-04-16 DIAGNOSIS — N183 Chronic kidney disease, stage 3 (moderate): Secondary | ICD-10-CM | POA: Diagnosis not present

## 2018-04-16 DIAGNOSIS — S51801D Unspecified open wound of right forearm, subsequent encounter: Secondary | ICD-10-CM | POA: Diagnosis not present

## 2018-04-16 DIAGNOSIS — S81801D Unspecified open wound, right lower leg, subsequent encounter: Secondary | ICD-10-CM | POA: Diagnosis not present

## 2018-04-16 DIAGNOSIS — E782 Mixed hyperlipidemia: Secondary | ICD-10-CM | POA: Diagnosis not present

## 2018-04-16 DIAGNOSIS — E039 Hypothyroidism, unspecified: Secondary | ICD-10-CM | POA: Diagnosis not present

## 2018-04-17 DIAGNOSIS — T670XXD Heatstroke and sunstroke, subsequent encounter: Secondary | ICD-10-CM | POA: Diagnosis not present

## 2018-04-17 DIAGNOSIS — S81801D Unspecified open wound, right lower leg, subsequent encounter: Secondary | ICD-10-CM | POA: Diagnosis not present

## 2018-04-17 DIAGNOSIS — N390 Urinary tract infection, site not specified: Secondary | ICD-10-CM | POA: Diagnosis not present

## 2018-04-17 DIAGNOSIS — N183 Chronic kidney disease, stage 3 (moderate): Secondary | ICD-10-CM | POA: Diagnosis not present

## 2018-04-17 DIAGNOSIS — F0390 Unspecified dementia without behavioral disturbance: Secondary | ICD-10-CM | POA: Diagnosis not present

## 2018-04-17 DIAGNOSIS — I129 Hypertensive chronic kidney disease with stage 1 through stage 4 chronic kidney disease, or unspecified chronic kidney disease: Secondary | ICD-10-CM | POA: Diagnosis not present

## 2018-04-17 DIAGNOSIS — S51801D Unspecified open wound of right forearm, subsequent encounter: Secondary | ICD-10-CM | POA: Diagnosis not present

## 2018-04-17 DIAGNOSIS — E039 Hypothyroidism, unspecified: Secondary | ICD-10-CM | POA: Diagnosis not present

## 2018-04-17 DIAGNOSIS — E782 Mixed hyperlipidemia: Secondary | ICD-10-CM | POA: Diagnosis not present

## 2018-04-18 DIAGNOSIS — N390 Urinary tract infection, site not specified: Secondary | ICD-10-CM | POA: Diagnosis not present

## 2018-04-18 DIAGNOSIS — S81801D Unspecified open wound, right lower leg, subsequent encounter: Secondary | ICD-10-CM | POA: Diagnosis not present

## 2018-04-18 DIAGNOSIS — T670XXD Heatstroke and sunstroke, subsequent encounter: Secondary | ICD-10-CM | POA: Diagnosis not present

## 2018-04-18 DIAGNOSIS — F0390 Unspecified dementia without behavioral disturbance: Secondary | ICD-10-CM | POA: Diagnosis not present

## 2018-04-18 DIAGNOSIS — I129 Hypertensive chronic kidney disease with stage 1 through stage 4 chronic kidney disease, or unspecified chronic kidney disease: Secondary | ICD-10-CM | POA: Diagnosis not present

## 2018-04-18 DIAGNOSIS — N183 Chronic kidney disease, stage 3 (moderate): Secondary | ICD-10-CM | POA: Diagnosis not present

## 2018-04-18 DIAGNOSIS — E782 Mixed hyperlipidemia: Secondary | ICD-10-CM | POA: Diagnosis not present

## 2018-04-18 DIAGNOSIS — E039 Hypothyroidism, unspecified: Secondary | ICD-10-CM | POA: Diagnosis not present

## 2018-04-18 DIAGNOSIS — S51801D Unspecified open wound of right forearm, subsequent encounter: Secondary | ICD-10-CM | POA: Diagnosis not present

## 2018-04-22 DIAGNOSIS — E782 Mixed hyperlipidemia: Secondary | ICD-10-CM | POA: Diagnosis not present

## 2018-04-22 DIAGNOSIS — N183 Chronic kidney disease, stage 3 (moderate): Secondary | ICD-10-CM | POA: Diagnosis not present

## 2018-04-22 DIAGNOSIS — S51801D Unspecified open wound of right forearm, subsequent encounter: Secondary | ICD-10-CM | POA: Diagnosis not present

## 2018-04-22 DIAGNOSIS — S81801D Unspecified open wound, right lower leg, subsequent encounter: Secondary | ICD-10-CM | POA: Diagnosis not present

## 2018-04-22 DIAGNOSIS — T670XXD Heatstroke and sunstroke, subsequent encounter: Secondary | ICD-10-CM | POA: Diagnosis not present

## 2018-04-22 DIAGNOSIS — I129 Hypertensive chronic kidney disease with stage 1 through stage 4 chronic kidney disease, or unspecified chronic kidney disease: Secondary | ICD-10-CM | POA: Diagnosis not present

## 2018-04-22 DIAGNOSIS — N390 Urinary tract infection, site not specified: Secondary | ICD-10-CM | POA: Diagnosis not present

## 2018-04-22 DIAGNOSIS — F0390 Unspecified dementia without behavioral disturbance: Secondary | ICD-10-CM | POA: Diagnosis not present

## 2018-04-22 DIAGNOSIS — E039 Hypothyroidism, unspecified: Secondary | ICD-10-CM | POA: Diagnosis not present

## 2018-04-23 DIAGNOSIS — S81801D Unspecified open wound, right lower leg, subsequent encounter: Secondary | ICD-10-CM | POA: Diagnosis not present

## 2018-04-23 DIAGNOSIS — F0390 Unspecified dementia without behavioral disturbance: Secondary | ICD-10-CM | POA: Diagnosis not present

## 2018-04-23 DIAGNOSIS — N390 Urinary tract infection, site not specified: Secondary | ICD-10-CM | POA: Diagnosis not present

## 2018-04-23 DIAGNOSIS — N183 Chronic kidney disease, stage 3 (moderate): Secondary | ICD-10-CM | POA: Diagnosis not present

## 2018-04-23 DIAGNOSIS — I129 Hypertensive chronic kidney disease with stage 1 through stage 4 chronic kidney disease, or unspecified chronic kidney disease: Secondary | ICD-10-CM | POA: Diagnosis not present

## 2018-04-23 DIAGNOSIS — S51801D Unspecified open wound of right forearm, subsequent encounter: Secondary | ICD-10-CM | POA: Diagnosis not present

## 2018-04-23 DIAGNOSIS — E782 Mixed hyperlipidemia: Secondary | ICD-10-CM | POA: Diagnosis not present

## 2018-04-23 DIAGNOSIS — E039 Hypothyroidism, unspecified: Secondary | ICD-10-CM | POA: Diagnosis not present

## 2018-04-23 DIAGNOSIS — T670XXD Heatstroke and sunstroke, subsequent encounter: Secondary | ICD-10-CM | POA: Diagnosis not present

## 2018-04-24 DIAGNOSIS — N183 Chronic kidney disease, stage 3 (moderate): Secondary | ICD-10-CM | POA: Diagnosis not present

## 2018-04-24 DIAGNOSIS — S51801D Unspecified open wound of right forearm, subsequent encounter: Secondary | ICD-10-CM | POA: Diagnosis not present

## 2018-04-24 DIAGNOSIS — F0390 Unspecified dementia without behavioral disturbance: Secondary | ICD-10-CM | POA: Diagnosis not present

## 2018-04-24 DIAGNOSIS — S81801D Unspecified open wound, right lower leg, subsequent encounter: Secondary | ICD-10-CM | POA: Diagnosis not present

## 2018-04-24 DIAGNOSIS — E039 Hypothyroidism, unspecified: Secondary | ICD-10-CM | POA: Diagnosis not present

## 2018-04-24 DIAGNOSIS — I129 Hypertensive chronic kidney disease with stage 1 through stage 4 chronic kidney disease, or unspecified chronic kidney disease: Secondary | ICD-10-CM | POA: Diagnosis not present

## 2018-04-24 DIAGNOSIS — N390 Urinary tract infection, site not specified: Secondary | ICD-10-CM | POA: Diagnosis not present

## 2018-04-24 DIAGNOSIS — T670XXD Heatstroke and sunstroke, subsequent encounter: Secondary | ICD-10-CM | POA: Diagnosis not present

## 2018-04-24 DIAGNOSIS — E782 Mixed hyperlipidemia: Secondary | ICD-10-CM | POA: Diagnosis not present

## 2018-04-25 DIAGNOSIS — E782 Mixed hyperlipidemia: Secondary | ICD-10-CM | POA: Diagnosis not present

## 2018-04-25 DIAGNOSIS — N390 Urinary tract infection, site not specified: Secondary | ICD-10-CM | POA: Diagnosis not present

## 2018-04-25 DIAGNOSIS — S81801D Unspecified open wound, right lower leg, subsequent encounter: Secondary | ICD-10-CM | POA: Diagnosis not present

## 2018-04-25 DIAGNOSIS — F0390 Unspecified dementia without behavioral disturbance: Secondary | ICD-10-CM | POA: Diagnosis not present

## 2018-04-25 DIAGNOSIS — N183 Chronic kidney disease, stage 3 (moderate): Secondary | ICD-10-CM | POA: Diagnosis not present

## 2018-04-25 DIAGNOSIS — S51801D Unspecified open wound of right forearm, subsequent encounter: Secondary | ICD-10-CM | POA: Diagnosis not present

## 2018-04-25 DIAGNOSIS — E039 Hypothyroidism, unspecified: Secondary | ICD-10-CM | POA: Diagnosis not present

## 2018-04-25 DIAGNOSIS — T670XXD Heatstroke and sunstroke, subsequent encounter: Secondary | ICD-10-CM | POA: Diagnosis not present

## 2018-04-25 DIAGNOSIS — I129 Hypertensive chronic kidney disease with stage 1 through stage 4 chronic kidney disease, or unspecified chronic kidney disease: Secondary | ICD-10-CM | POA: Diagnosis not present

## 2018-04-28 DIAGNOSIS — N183 Chronic kidney disease, stage 3 (moderate): Secondary | ICD-10-CM | POA: Diagnosis not present

## 2018-04-28 DIAGNOSIS — I129 Hypertensive chronic kidney disease with stage 1 through stage 4 chronic kidney disease, or unspecified chronic kidney disease: Secondary | ICD-10-CM | POA: Diagnosis not present

## 2018-04-28 DIAGNOSIS — T670XXD Heatstroke and sunstroke, subsequent encounter: Secondary | ICD-10-CM | POA: Diagnosis not present

## 2018-04-28 DIAGNOSIS — F0390 Unspecified dementia without behavioral disturbance: Secondary | ICD-10-CM | POA: Diagnosis not present

## 2018-04-28 DIAGNOSIS — N390 Urinary tract infection, site not specified: Secondary | ICD-10-CM | POA: Diagnosis not present

## 2018-04-28 DIAGNOSIS — S81801D Unspecified open wound, right lower leg, subsequent encounter: Secondary | ICD-10-CM | POA: Diagnosis not present

## 2018-04-28 DIAGNOSIS — E039 Hypothyroidism, unspecified: Secondary | ICD-10-CM | POA: Diagnosis not present

## 2018-04-28 DIAGNOSIS — E782 Mixed hyperlipidemia: Secondary | ICD-10-CM | POA: Diagnosis not present

## 2018-04-28 DIAGNOSIS — S81809A Unspecified open wound, unspecified lower leg, initial encounter: Secondary | ICD-10-CM | POA: Diagnosis not present

## 2018-04-28 DIAGNOSIS — S51801D Unspecified open wound of right forearm, subsequent encounter: Secondary | ICD-10-CM | POA: Diagnosis not present

## 2018-04-30 DIAGNOSIS — T670XXD Heatstroke and sunstroke, subsequent encounter: Secondary | ICD-10-CM | POA: Diagnosis not present

## 2018-04-30 DIAGNOSIS — F0390 Unspecified dementia without behavioral disturbance: Secondary | ICD-10-CM | POA: Diagnosis not present

## 2018-04-30 DIAGNOSIS — E039 Hypothyroidism, unspecified: Secondary | ICD-10-CM | POA: Diagnosis not present

## 2018-04-30 DIAGNOSIS — S51801D Unspecified open wound of right forearm, subsequent encounter: Secondary | ICD-10-CM | POA: Diagnosis not present

## 2018-04-30 DIAGNOSIS — S81801D Unspecified open wound, right lower leg, subsequent encounter: Secondary | ICD-10-CM | POA: Diagnosis not present

## 2018-04-30 DIAGNOSIS — N183 Chronic kidney disease, stage 3 (moderate): Secondary | ICD-10-CM | POA: Diagnosis not present

## 2018-04-30 DIAGNOSIS — I129 Hypertensive chronic kidney disease with stage 1 through stage 4 chronic kidney disease, or unspecified chronic kidney disease: Secondary | ICD-10-CM | POA: Diagnosis not present

## 2018-04-30 DIAGNOSIS — N390 Urinary tract infection, site not specified: Secondary | ICD-10-CM | POA: Diagnosis not present

## 2018-04-30 DIAGNOSIS — E782 Mixed hyperlipidemia: Secondary | ICD-10-CM | POA: Diagnosis not present

## 2018-05-01 DIAGNOSIS — E039 Hypothyroidism, unspecified: Secondary | ICD-10-CM | POA: Diagnosis not present

## 2018-05-01 DIAGNOSIS — N390 Urinary tract infection, site not specified: Secondary | ICD-10-CM | POA: Diagnosis not present

## 2018-05-01 DIAGNOSIS — I129 Hypertensive chronic kidney disease with stage 1 through stage 4 chronic kidney disease, or unspecified chronic kidney disease: Secondary | ICD-10-CM | POA: Diagnosis not present

## 2018-05-01 DIAGNOSIS — E782 Mixed hyperlipidemia: Secondary | ICD-10-CM | POA: Diagnosis not present

## 2018-05-01 DIAGNOSIS — S81801D Unspecified open wound, right lower leg, subsequent encounter: Secondary | ICD-10-CM | POA: Diagnosis not present

## 2018-05-01 DIAGNOSIS — N183 Chronic kidney disease, stage 3 (moderate): Secondary | ICD-10-CM | POA: Diagnosis not present

## 2018-05-01 DIAGNOSIS — T670XXD Heatstroke and sunstroke, subsequent encounter: Secondary | ICD-10-CM | POA: Diagnosis not present

## 2018-05-01 DIAGNOSIS — F0390 Unspecified dementia without behavioral disturbance: Secondary | ICD-10-CM | POA: Diagnosis not present

## 2018-05-01 DIAGNOSIS — S51801D Unspecified open wound of right forearm, subsequent encounter: Secondary | ICD-10-CM | POA: Diagnosis not present

## 2018-05-02 DIAGNOSIS — S81801D Unspecified open wound, right lower leg, subsequent encounter: Secondary | ICD-10-CM | POA: Diagnosis not present

## 2018-05-02 DIAGNOSIS — I129 Hypertensive chronic kidney disease with stage 1 through stage 4 chronic kidney disease, or unspecified chronic kidney disease: Secondary | ICD-10-CM | POA: Diagnosis not present

## 2018-05-02 DIAGNOSIS — N183 Chronic kidney disease, stage 3 (moderate): Secondary | ICD-10-CM | POA: Diagnosis not present

## 2018-05-02 DIAGNOSIS — E039 Hypothyroidism, unspecified: Secondary | ICD-10-CM | POA: Diagnosis not present

## 2018-05-02 DIAGNOSIS — N390 Urinary tract infection, site not specified: Secondary | ICD-10-CM | POA: Diagnosis not present

## 2018-05-02 DIAGNOSIS — F0390 Unspecified dementia without behavioral disturbance: Secondary | ICD-10-CM | POA: Diagnosis not present

## 2018-05-02 DIAGNOSIS — S51801D Unspecified open wound of right forearm, subsequent encounter: Secondary | ICD-10-CM | POA: Diagnosis not present

## 2018-05-02 DIAGNOSIS — T670XXD Heatstroke and sunstroke, subsequent encounter: Secondary | ICD-10-CM | POA: Diagnosis not present

## 2018-05-02 DIAGNOSIS — E782 Mixed hyperlipidemia: Secondary | ICD-10-CM | POA: Diagnosis not present

## 2018-05-05 DIAGNOSIS — T670XXD Heatstroke and sunstroke, subsequent encounter: Secondary | ICD-10-CM | POA: Diagnosis not present

## 2018-05-05 DIAGNOSIS — E039 Hypothyroidism, unspecified: Secondary | ICD-10-CM | POA: Diagnosis not present

## 2018-05-05 DIAGNOSIS — S51801D Unspecified open wound of right forearm, subsequent encounter: Secondary | ICD-10-CM | POA: Diagnosis not present

## 2018-05-05 DIAGNOSIS — I129 Hypertensive chronic kidney disease with stage 1 through stage 4 chronic kidney disease, or unspecified chronic kidney disease: Secondary | ICD-10-CM | POA: Diagnosis not present

## 2018-05-05 DIAGNOSIS — F0390 Unspecified dementia without behavioral disturbance: Secondary | ICD-10-CM | POA: Diagnosis not present

## 2018-05-05 DIAGNOSIS — S81801D Unspecified open wound, right lower leg, subsequent encounter: Secondary | ICD-10-CM | POA: Diagnosis not present

## 2018-05-05 DIAGNOSIS — N390 Urinary tract infection, site not specified: Secondary | ICD-10-CM | POA: Diagnosis not present

## 2018-05-05 DIAGNOSIS — N183 Chronic kidney disease, stage 3 (moderate): Secondary | ICD-10-CM | POA: Diagnosis not present

## 2018-05-05 DIAGNOSIS — E782 Mixed hyperlipidemia: Secondary | ICD-10-CM | POA: Diagnosis not present

## 2018-05-06 DIAGNOSIS — E039 Hypothyroidism, unspecified: Secondary | ICD-10-CM | POA: Diagnosis not present

## 2018-05-06 DIAGNOSIS — N183 Chronic kidney disease, stage 3 (moderate): Secondary | ICD-10-CM | POA: Diagnosis not present

## 2018-05-06 DIAGNOSIS — S51801D Unspecified open wound of right forearm, subsequent encounter: Secondary | ICD-10-CM | POA: Diagnosis not present

## 2018-05-06 DIAGNOSIS — T670XXD Heatstroke and sunstroke, subsequent encounter: Secondary | ICD-10-CM | POA: Diagnosis not present

## 2018-05-06 DIAGNOSIS — E782 Mixed hyperlipidemia: Secondary | ICD-10-CM | POA: Diagnosis not present

## 2018-05-06 DIAGNOSIS — I129 Hypertensive chronic kidney disease with stage 1 through stage 4 chronic kidney disease, or unspecified chronic kidney disease: Secondary | ICD-10-CM | POA: Diagnosis not present

## 2018-05-06 DIAGNOSIS — N390 Urinary tract infection, site not specified: Secondary | ICD-10-CM | POA: Diagnosis not present

## 2018-05-06 DIAGNOSIS — F0390 Unspecified dementia without behavioral disturbance: Secondary | ICD-10-CM | POA: Diagnosis not present

## 2018-05-06 DIAGNOSIS — S81801D Unspecified open wound, right lower leg, subsequent encounter: Secondary | ICD-10-CM | POA: Diagnosis not present

## 2018-05-07 DIAGNOSIS — N183 Chronic kidney disease, stage 3 (moderate): Secondary | ICD-10-CM | POA: Diagnosis not present

## 2018-05-07 DIAGNOSIS — N390 Urinary tract infection, site not specified: Secondary | ICD-10-CM | POA: Diagnosis not present

## 2018-05-07 DIAGNOSIS — I129 Hypertensive chronic kidney disease with stage 1 through stage 4 chronic kidney disease, or unspecified chronic kidney disease: Secondary | ICD-10-CM | POA: Diagnosis not present

## 2018-05-07 DIAGNOSIS — T670XXD Heatstroke and sunstroke, subsequent encounter: Secondary | ICD-10-CM | POA: Diagnosis not present

## 2018-05-07 DIAGNOSIS — E782 Mixed hyperlipidemia: Secondary | ICD-10-CM | POA: Diagnosis not present

## 2018-05-07 DIAGNOSIS — E039 Hypothyroidism, unspecified: Secondary | ICD-10-CM | POA: Diagnosis not present

## 2018-05-07 DIAGNOSIS — F0390 Unspecified dementia without behavioral disturbance: Secondary | ICD-10-CM | POA: Diagnosis not present

## 2018-05-07 DIAGNOSIS — S81809D Unspecified open wound, unspecified lower leg, subsequent encounter: Secondary | ICD-10-CM | POA: Diagnosis not present

## 2018-05-07 DIAGNOSIS — S51801D Unspecified open wound of right forearm, subsequent encounter: Secondary | ICD-10-CM | POA: Diagnosis not present

## 2018-05-07 DIAGNOSIS — S81801D Unspecified open wound, right lower leg, subsequent encounter: Secondary | ICD-10-CM | POA: Diagnosis not present

## 2018-05-08 DIAGNOSIS — E039 Hypothyroidism, unspecified: Secondary | ICD-10-CM | POA: Diagnosis not present

## 2018-05-08 DIAGNOSIS — T670XXD Heatstroke and sunstroke, subsequent encounter: Secondary | ICD-10-CM | POA: Diagnosis not present

## 2018-05-08 DIAGNOSIS — S51801D Unspecified open wound of right forearm, subsequent encounter: Secondary | ICD-10-CM | POA: Diagnosis not present

## 2018-05-08 DIAGNOSIS — E782 Mixed hyperlipidemia: Secondary | ICD-10-CM | POA: Diagnosis not present

## 2018-05-08 DIAGNOSIS — N183 Chronic kidney disease, stage 3 (moderate): Secondary | ICD-10-CM | POA: Diagnosis not present

## 2018-05-08 DIAGNOSIS — F0390 Unspecified dementia without behavioral disturbance: Secondary | ICD-10-CM | POA: Diagnosis not present

## 2018-05-08 DIAGNOSIS — I129 Hypertensive chronic kidney disease with stage 1 through stage 4 chronic kidney disease, or unspecified chronic kidney disease: Secondary | ICD-10-CM | POA: Diagnosis not present

## 2018-05-08 DIAGNOSIS — N390 Urinary tract infection, site not specified: Secondary | ICD-10-CM | POA: Diagnosis not present

## 2018-05-08 DIAGNOSIS — S81801D Unspecified open wound, right lower leg, subsequent encounter: Secondary | ICD-10-CM | POA: Diagnosis not present

## 2018-05-09 DIAGNOSIS — N390 Urinary tract infection, site not specified: Secondary | ICD-10-CM | POA: Diagnosis not present

## 2018-05-09 DIAGNOSIS — E039 Hypothyroidism, unspecified: Secondary | ICD-10-CM | POA: Diagnosis not present

## 2018-05-09 DIAGNOSIS — I129 Hypertensive chronic kidney disease with stage 1 through stage 4 chronic kidney disease, or unspecified chronic kidney disease: Secondary | ICD-10-CM | POA: Diagnosis not present

## 2018-05-09 DIAGNOSIS — S51801D Unspecified open wound of right forearm, subsequent encounter: Secondary | ICD-10-CM | POA: Diagnosis not present

## 2018-05-09 DIAGNOSIS — N183 Chronic kidney disease, stage 3 (moderate): Secondary | ICD-10-CM | POA: Diagnosis not present

## 2018-05-09 DIAGNOSIS — T670XXD Heatstroke and sunstroke, subsequent encounter: Secondary | ICD-10-CM | POA: Diagnosis not present

## 2018-05-09 DIAGNOSIS — F0390 Unspecified dementia without behavioral disturbance: Secondary | ICD-10-CM | POA: Diagnosis not present

## 2018-05-09 DIAGNOSIS — E782 Mixed hyperlipidemia: Secondary | ICD-10-CM | POA: Diagnosis not present

## 2018-05-09 DIAGNOSIS — S81801D Unspecified open wound, right lower leg, subsequent encounter: Secondary | ICD-10-CM | POA: Diagnosis not present

## 2018-05-13 DIAGNOSIS — I129 Hypertensive chronic kidney disease with stage 1 through stage 4 chronic kidney disease, or unspecified chronic kidney disease: Secondary | ICD-10-CM | POA: Diagnosis not present

## 2018-05-13 DIAGNOSIS — N183 Chronic kidney disease, stage 3 (moderate): Secondary | ICD-10-CM | POA: Diagnosis not present

## 2018-05-13 DIAGNOSIS — E039 Hypothyroidism, unspecified: Secondary | ICD-10-CM | POA: Diagnosis not present

## 2018-05-13 DIAGNOSIS — N390 Urinary tract infection, site not specified: Secondary | ICD-10-CM | POA: Diagnosis not present

## 2018-05-13 DIAGNOSIS — S81801D Unspecified open wound, right lower leg, subsequent encounter: Secondary | ICD-10-CM | POA: Diagnosis not present

## 2018-05-13 DIAGNOSIS — F0390 Unspecified dementia without behavioral disturbance: Secondary | ICD-10-CM | POA: Diagnosis not present

## 2018-05-13 DIAGNOSIS — T670XXD Heatstroke and sunstroke, subsequent encounter: Secondary | ICD-10-CM | POA: Diagnosis not present

## 2018-05-13 DIAGNOSIS — S51801D Unspecified open wound of right forearm, subsequent encounter: Secondary | ICD-10-CM | POA: Diagnosis not present

## 2018-05-13 DIAGNOSIS — E782 Mixed hyperlipidemia: Secondary | ICD-10-CM | POA: Diagnosis not present

## 2018-05-15 DIAGNOSIS — S81801D Unspecified open wound, right lower leg, subsequent encounter: Secondary | ICD-10-CM | POA: Diagnosis not present

## 2018-05-15 DIAGNOSIS — N390 Urinary tract infection, site not specified: Secondary | ICD-10-CM | POA: Diagnosis not present

## 2018-05-15 DIAGNOSIS — E039 Hypothyroidism, unspecified: Secondary | ICD-10-CM | POA: Diagnosis not present

## 2018-05-15 DIAGNOSIS — N183 Chronic kidney disease, stage 3 (moderate): Secondary | ICD-10-CM | POA: Diagnosis not present

## 2018-05-15 DIAGNOSIS — E782 Mixed hyperlipidemia: Secondary | ICD-10-CM | POA: Diagnosis not present

## 2018-05-15 DIAGNOSIS — I129 Hypertensive chronic kidney disease with stage 1 through stage 4 chronic kidney disease, or unspecified chronic kidney disease: Secondary | ICD-10-CM | POA: Diagnosis not present

## 2018-05-15 DIAGNOSIS — F0390 Unspecified dementia without behavioral disturbance: Secondary | ICD-10-CM | POA: Diagnosis not present

## 2018-05-15 DIAGNOSIS — S51801D Unspecified open wound of right forearm, subsequent encounter: Secondary | ICD-10-CM | POA: Diagnosis not present

## 2018-05-15 DIAGNOSIS — T670XXD Heatstroke and sunstroke, subsequent encounter: Secondary | ICD-10-CM | POA: Diagnosis not present

## 2018-05-16 DIAGNOSIS — S51801D Unspecified open wound of right forearm, subsequent encounter: Secondary | ICD-10-CM | POA: Diagnosis not present

## 2018-05-16 DIAGNOSIS — I129 Hypertensive chronic kidney disease with stage 1 through stage 4 chronic kidney disease, or unspecified chronic kidney disease: Secondary | ICD-10-CM | POA: Diagnosis not present

## 2018-05-16 DIAGNOSIS — N183 Chronic kidney disease, stage 3 (moderate): Secondary | ICD-10-CM | POA: Diagnosis not present

## 2018-05-16 DIAGNOSIS — T670XXD Heatstroke and sunstroke, subsequent encounter: Secondary | ICD-10-CM | POA: Diagnosis not present

## 2018-05-16 DIAGNOSIS — F0390 Unspecified dementia without behavioral disturbance: Secondary | ICD-10-CM | POA: Diagnosis not present

## 2018-05-16 DIAGNOSIS — E782 Mixed hyperlipidemia: Secondary | ICD-10-CM | POA: Diagnosis not present

## 2018-05-16 DIAGNOSIS — E039 Hypothyroidism, unspecified: Secondary | ICD-10-CM | POA: Diagnosis not present

## 2018-05-16 DIAGNOSIS — S81801D Unspecified open wound, right lower leg, subsequent encounter: Secondary | ICD-10-CM | POA: Diagnosis not present

## 2018-05-16 DIAGNOSIS — N390 Urinary tract infection, site not specified: Secondary | ICD-10-CM | POA: Diagnosis not present

## 2018-05-19 DIAGNOSIS — N390 Urinary tract infection, site not specified: Secondary | ICD-10-CM | POA: Diagnosis not present

## 2018-05-19 DIAGNOSIS — R531 Weakness: Secondary | ICD-10-CM | POA: Diagnosis not present

## 2018-05-19 DIAGNOSIS — T670XXD Heatstroke and sunstroke, subsequent encounter: Secondary | ICD-10-CM | POA: Diagnosis not present

## 2018-05-19 DIAGNOSIS — I129 Hypertensive chronic kidney disease with stage 1 through stage 4 chronic kidney disease, or unspecified chronic kidney disease: Secondary | ICD-10-CM | POA: Diagnosis not present

## 2018-05-19 DIAGNOSIS — N183 Chronic kidney disease, stage 3 (moderate): Secondary | ICD-10-CM | POA: Diagnosis not present

## 2018-05-19 DIAGNOSIS — Z9181 History of falling: Secondary | ICD-10-CM | POA: Diagnosis not present

## 2018-05-19 DIAGNOSIS — E782 Mixed hyperlipidemia: Secondary | ICD-10-CM | POA: Diagnosis not present

## 2018-05-19 DIAGNOSIS — S81801D Unspecified open wound, right lower leg, subsequent encounter: Secondary | ICD-10-CM | POA: Diagnosis not present

## 2018-05-19 DIAGNOSIS — S51801D Unspecified open wound of right forearm, subsequent encounter: Secondary | ICD-10-CM | POA: Diagnosis not present

## 2018-05-19 DIAGNOSIS — F0390 Unspecified dementia without behavioral disturbance: Secondary | ICD-10-CM | POA: Diagnosis not present

## 2018-05-19 DIAGNOSIS — E039 Hypothyroidism, unspecified: Secondary | ICD-10-CM | POA: Diagnosis not present

## 2018-05-20 DIAGNOSIS — E039 Hypothyroidism, unspecified: Secondary | ICD-10-CM | POA: Diagnosis not present

## 2018-05-20 DIAGNOSIS — S81801D Unspecified open wound, right lower leg, subsequent encounter: Secondary | ICD-10-CM | POA: Diagnosis not present

## 2018-05-20 DIAGNOSIS — T670XXD Heatstroke and sunstroke, subsequent encounter: Secondary | ICD-10-CM | POA: Diagnosis not present

## 2018-05-20 DIAGNOSIS — N390 Urinary tract infection, site not specified: Secondary | ICD-10-CM | POA: Diagnosis not present

## 2018-05-20 DIAGNOSIS — E782 Mixed hyperlipidemia: Secondary | ICD-10-CM | POA: Diagnosis not present

## 2018-05-20 DIAGNOSIS — I129 Hypertensive chronic kidney disease with stage 1 through stage 4 chronic kidney disease, or unspecified chronic kidney disease: Secondary | ICD-10-CM | POA: Diagnosis not present

## 2018-05-20 DIAGNOSIS — N183 Chronic kidney disease, stage 3 (moderate): Secondary | ICD-10-CM | POA: Diagnosis not present

## 2018-05-20 DIAGNOSIS — F0390 Unspecified dementia without behavioral disturbance: Secondary | ICD-10-CM | POA: Diagnosis not present

## 2018-05-20 DIAGNOSIS — S51801D Unspecified open wound of right forearm, subsequent encounter: Secondary | ICD-10-CM | POA: Diagnosis not present

## 2018-05-21 DIAGNOSIS — N390 Urinary tract infection, site not specified: Secondary | ICD-10-CM | POA: Diagnosis not present

## 2018-05-21 DIAGNOSIS — N183 Chronic kidney disease, stage 3 (moderate): Secondary | ICD-10-CM | POA: Diagnosis not present

## 2018-05-21 DIAGNOSIS — E782 Mixed hyperlipidemia: Secondary | ICD-10-CM | POA: Diagnosis not present

## 2018-05-21 DIAGNOSIS — E039 Hypothyroidism, unspecified: Secondary | ICD-10-CM | POA: Diagnosis not present

## 2018-05-21 DIAGNOSIS — I129 Hypertensive chronic kidney disease with stage 1 through stage 4 chronic kidney disease, or unspecified chronic kidney disease: Secondary | ICD-10-CM | POA: Diagnosis not present

## 2018-05-21 DIAGNOSIS — S91002A Unspecified open wound, left ankle, initial encounter: Secondary | ICD-10-CM | POA: Diagnosis not present

## 2018-05-21 DIAGNOSIS — S81801D Unspecified open wound, right lower leg, subsequent encounter: Secondary | ICD-10-CM | POA: Diagnosis not present

## 2018-05-21 DIAGNOSIS — T670XXD Heatstroke and sunstroke, subsequent encounter: Secondary | ICD-10-CM | POA: Diagnosis not present

## 2018-05-21 DIAGNOSIS — F0390 Unspecified dementia without behavioral disturbance: Secondary | ICD-10-CM | POA: Diagnosis not present

## 2018-05-21 DIAGNOSIS — S51801D Unspecified open wound of right forearm, subsequent encounter: Secondary | ICD-10-CM | POA: Diagnosis not present

## 2018-05-22 DIAGNOSIS — T670XXD Heatstroke and sunstroke, subsequent encounter: Secondary | ICD-10-CM | POA: Diagnosis not present

## 2018-05-22 DIAGNOSIS — S51801D Unspecified open wound of right forearm, subsequent encounter: Secondary | ICD-10-CM | POA: Diagnosis not present

## 2018-05-22 DIAGNOSIS — F0390 Unspecified dementia without behavioral disturbance: Secondary | ICD-10-CM | POA: Diagnosis not present

## 2018-05-22 DIAGNOSIS — E039 Hypothyroidism, unspecified: Secondary | ICD-10-CM | POA: Diagnosis not present

## 2018-05-22 DIAGNOSIS — E782 Mixed hyperlipidemia: Secondary | ICD-10-CM | POA: Diagnosis not present

## 2018-05-22 DIAGNOSIS — N390 Urinary tract infection, site not specified: Secondary | ICD-10-CM | POA: Diagnosis not present

## 2018-05-22 DIAGNOSIS — N183 Chronic kidney disease, stage 3 (moderate): Secondary | ICD-10-CM | POA: Diagnosis not present

## 2018-05-22 DIAGNOSIS — S81801D Unspecified open wound, right lower leg, subsequent encounter: Secondary | ICD-10-CM | POA: Diagnosis not present

## 2018-05-22 DIAGNOSIS — I129 Hypertensive chronic kidney disease with stage 1 through stage 4 chronic kidney disease, or unspecified chronic kidney disease: Secondary | ICD-10-CM | POA: Diagnosis not present

## 2018-05-23 DIAGNOSIS — F0390 Unspecified dementia without behavioral disturbance: Secondary | ICD-10-CM | POA: Diagnosis not present

## 2018-05-23 DIAGNOSIS — E782 Mixed hyperlipidemia: Secondary | ICD-10-CM | POA: Diagnosis not present

## 2018-05-23 DIAGNOSIS — I129 Hypertensive chronic kidney disease with stage 1 through stage 4 chronic kidney disease, or unspecified chronic kidney disease: Secondary | ICD-10-CM | POA: Diagnosis not present

## 2018-05-23 DIAGNOSIS — N390 Urinary tract infection, site not specified: Secondary | ICD-10-CM | POA: Diagnosis not present

## 2018-05-23 DIAGNOSIS — S81801D Unspecified open wound, right lower leg, subsequent encounter: Secondary | ICD-10-CM | POA: Diagnosis not present

## 2018-05-23 DIAGNOSIS — S51801D Unspecified open wound of right forearm, subsequent encounter: Secondary | ICD-10-CM | POA: Diagnosis not present

## 2018-05-23 DIAGNOSIS — T670XXD Heatstroke and sunstroke, subsequent encounter: Secondary | ICD-10-CM | POA: Diagnosis not present

## 2018-05-23 DIAGNOSIS — N183 Chronic kidney disease, stage 3 (moderate): Secondary | ICD-10-CM | POA: Diagnosis not present

## 2018-05-23 DIAGNOSIS — E039 Hypothyroidism, unspecified: Secondary | ICD-10-CM | POA: Diagnosis not present

## 2018-05-27 DIAGNOSIS — T670XXD Heatstroke and sunstroke, subsequent encounter: Secondary | ICD-10-CM | POA: Diagnosis not present

## 2018-05-27 DIAGNOSIS — E782 Mixed hyperlipidemia: Secondary | ICD-10-CM | POA: Diagnosis not present

## 2018-05-27 DIAGNOSIS — I129 Hypertensive chronic kidney disease with stage 1 through stage 4 chronic kidney disease, or unspecified chronic kidney disease: Secondary | ICD-10-CM | POA: Diagnosis not present

## 2018-05-27 DIAGNOSIS — S81801D Unspecified open wound, right lower leg, subsequent encounter: Secondary | ICD-10-CM | POA: Diagnosis not present

## 2018-05-27 DIAGNOSIS — F0390 Unspecified dementia without behavioral disturbance: Secondary | ICD-10-CM | POA: Diagnosis not present

## 2018-05-27 DIAGNOSIS — N390 Urinary tract infection, site not specified: Secondary | ICD-10-CM | POA: Diagnosis not present

## 2018-05-27 DIAGNOSIS — N183 Chronic kidney disease, stage 3 (moderate): Secondary | ICD-10-CM | POA: Diagnosis not present

## 2018-05-27 DIAGNOSIS — S51801D Unspecified open wound of right forearm, subsequent encounter: Secondary | ICD-10-CM | POA: Diagnosis not present

## 2018-05-27 DIAGNOSIS — E039 Hypothyroidism, unspecified: Secondary | ICD-10-CM | POA: Diagnosis not present

## 2018-05-28 DIAGNOSIS — N183 Chronic kidney disease, stage 3 (moderate): Secondary | ICD-10-CM | POA: Diagnosis not present

## 2018-05-28 DIAGNOSIS — S51801D Unspecified open wound of right forearm, subsequent encounter: Secondary | ICD-10-CM | POA: Diagnosis not present

## 2018-05-28 DIAGNOSIS — F0390 Unspecified dementia without behavioral disturbance: Secondary | ICD-10-CM | POA: Diagnosis not present

## 2018-05-28 DIAGNOSIS — T670XXD Heatstroke and sunstroke, subsequent encounter: Secondary | ICD-10-CM | POA: Diagnosis not present

## 2018-05-28 DIAGNOSIS — E782 Mixed hyperlipidemia: Secondary | ICD-10-CM | POA: Diagnosis not present

## 2018-05-28 DIAGNOSIS — S81801D Unspecified open wound, right lower leg, subsequent encounter: Secondary | ICD-10-CM | POA: Diagnosis not present

## 2018-05-28 DIAGNOSIS — I129 Hypertensive chronic kidney disease with stage 1 through stage 4 chronic kidney disease, or unspecified chronic kidney disease: Secondary | ICD-10-CM | POA: Diagnosis not present

## 2018-05-28 DIAGNOSIS — N390 Urinary tract infection, site not specified: Secondary | ICD-10-CM | POA: Diagnosis not present

## 2018-05-28 DIAGNOSIS — E039 Hypothyroidism, unspecified: Secondary | ICD-10-CM | POA: Diagnosis not present

## 2018-05-29 DIAGNOSIS — F0390 Unspecified dementia without behavioral disturbance: Secondary | ICD-10-CM | POA: Diagnosis not present

## 2018-05-29 DIAGNOSIS — I129 Hypertensive chronic kidney disease with stage 1 through stage 4 chronic kidney disease, or unspecified chronic kidney disease: Secondary | ICD-10-CM | POA: Diagnosis not present

## 2018-05-29 DIAGNOSIS — T670XXD Heatstroke and sunstroke, subsequent encounter: Secondary | ICD-10-CM | POA: Diagnosis not present

## 2018-05-29 DIAGNOSIS — E039 Hypothyroidism, unspecified: Secondary | ICD-10-CM | POA: Diagnosis not present

## 2018-05-29 DIAGNOSIS — N390 Urinary tract infection, site not specified: Secondary | ICD-10-CM | POA: Diagnosis not present

## 2018-05-29 DIAGNOSIS — S51801D Unspecified open wound of right forearm, subsequent encounter: Secondary | ICD-10-CM | POA: Diagnosis not present

## 2018-05-29 DIAGNOSIS — N183 Chronic kidney disease, stage 3 (moderate): Secondary | ICD-10-CM | POA: Diagnosis not present

## 2018-05-29 DIAGNOSIS — E782 Mixed hyperlipidemia: Secondary | ICD-10-CM | POA: Diagnosis not present

## 2018-05-29 DIAGNOSIS — S81801D Unspecified open wound, right lower leg, subsequent encounter: Secondary | ICD-10-CM | POA: Diagnosis not present

## 2018-06-04 DIAGNOSIS — S81801D Unspecified open wound, right lower leg, subsequent encounter: Secondary | ICD-10-CM | POA: Diagnosis not present

## 2018-06-04 DIAGNOSIS — T670XXD Heatstroke and sunstroke, subsequent encounter: Secondary | ICD-10-CM | POA: Diagnosis not present

## 2018-06-04 DIAGNOSIS — I129 Hypertensive chronic kidney disease with stage 1 through stage 4 chronic kidney disease, or unspecified chronic kidney disease: Secondary | ICD-10-CM | POA: Diagnosis not present

## 2018-06-04 DIAGNOSIS — F0390 Unspecified dementia without behavioral disturbance: Secondary | ICD-10-CM | POA: Diagnosis not present

## 2018-06-04 DIAGNOSIS — S51801D Unspecified open wound of right forearm, subsequent encounter: Secondary | ICD-10-CM | POA: Diagnosis not present

## 2018-06-04 DIAGNOSIS — N183 Chronic kidney disease, stage 3 (moderate): Secondary | ICD-10-CM | POA: Diagnosis not present

## 2018-06-04 DIAGNOSIS — E782 Mixed hyperlipidemia: Secondary | ICD-10-CM | POA: Diagnosis not present

## 2018-06-04 DIAGNOSIS — E039 Hypothyroidism, unspecified: Secondary | ICD-10-CM | POA: Diagnosis not present

## 2018-06-04 DIAGNOSIS — N390 Urinary tract infection, site not specified: Secondary | ICD-10-CM | POA: Diagnosis not present

## 2018-06-05 DIAGNOSIS — I739 Peripheral vascular disease, unspecified: Secondary | ICD-10-CM | POA: Diagnosis not present

## 2018-06-05 DIAGNOSIS — L97929 Non-pressure chronic ulcer of unspecified part of left lower leg with unspecified severity: Secondary | ICD-10-CM | POA: Diagnosis not present

## 2018-06-05 DIAGNOSIS — L97329 Non-pressure chronic ulcer of left ankle with unspecified severity: Secondary | ICD-10-CM | POA: Diagnosis not present

## 2018-06-07 DIAGNOSIS — S81801D Unspecified open wound, right lower leg, subsequent encounter: Secondary | ICD-10-CM | POA: Diagnosis not present

## 2018-06-07 DIAGNOSIS — Z4800 Encounter for change or removal of nonsurgical wound dressing: Secondary | ICD-10-CM | POA: Diagnosis not present

## 2018-06-07 DIAGNOSIS — S51801D Unspecified open wound of right forearm, subsequent encounter: Secondary | ICD-10-CM | POA: Diagnosis not present

## 2018-06-07 DIAGNOSIS — I129 Hypertensive chronic kidney disease with stage 1 through stage 4 chronic kidney disease, or unspecified chronic kidney disease: Secondary | ICD-10-CM | POA: Diagnosis not present

## 2018-06-07 DIAGNOSIS — F0390 Unspecified dementia without behavioral disturbance: Secondary | ICD-10-CM | POA: Diagnosis not present

## 2018-06-07 DIAGNOSIS — Z9181 History of falling: Secondary | ICD-10-CM | POA: Diagnosis not present

## 2018-06-07 DIAGNOSIS — N183 Chronic kidney disease, stage 3 (moderate): Secondary | ICD-10-CM | POA: Diagnosis not present

## 2018-06-07 DIAGNOSIS — E039 Hypothyroidism, unspecified: Secondary | ICD-10-CM | POA: Diagnosis not present

## 2018-06-07 DIAGNOSIS — E782 Mixed hyperlipidemia: Secondary | ICD-10-CM | POA: Diagnosis not present

## 2018-06-10 DIAGNOSIS — S51801D Unspecified open wound of right forearm, subsequent encounter: Secondary | ICD-10-CM | POA: Diagnosis not present

## 2018-06-10 DIAGNOSIS — Z4800 Encounter for change or removal of nonsurgical wound dressing: Secondary | ICD-10-CM | POA: Diagnosis not present

## 2018-06-10 DIAGNOSIS — I129 Hypertensive chronic kidney disease with stage 1 through stage 4 chronic kidney disease, or unspecified chronic kidney disease: Secondary | ICD-10-CM | POA: Diagnosis not present

## 2018-06-10 DIAGNOSIS — E782 Mixed hyperlipidemia: Secondary | ICD-10-CM | POA: Diagnosis not present

## 2018-06-10 DIAGNOSIS — N183 Chronic kidney disease, stage 3 (moderate): Secondary | ICD-10-CM | POA: Diagnosis not present

## 2018-06-10 DIAGNOSIS — E039 Hypothyroidism, unspecified: Secondary | ICD-10-CM | POA: Diagnosis not present

## 2018-06-10 DIAGNOSIS — S81801D Unspecified open wound, right lower leg, subsequent encounter: Secondary | ICD-10-CM | POA: Diagnosis not present

## 2018-06-10 DIAGNOSIS — F0390 Unspecified dementia without behavioral disturbance: Secondary | ICD-10-CM | POA: Diagnosis not present

## 2018-06-10 DIAGNOSIS — Z9181 History of falling: Secondary | ICD-10-CM | POA: Diagnosis not present

## 2018-06-12 DIAGNOSIS — I70202 Unspecified atherosclerosis of native arteries of extremities, left leg: Secondary | ICD-10-CM | POA: Diagnosis not present

## 2018-06-12 DIAGNOSIS — Z872 Personal history of diseases of the skin and subcutaneous tissue: Secondary | ICD-10-CM | POA: Diagnosis not present

## 2018-06-12 DIAGNOSIS — L97329 Non-pressure chronic ulcer of left ankle with unspecified severity: Secondary | ICD-10-CM | POA: Diagnosis not present

## 2018-06-16 DIAGNOSIS — E039 Hypothyroidism, unspecified: Secondary | ICD-10-CM | POA: Diagnosis not present

## 2018-06-16 DIAGNOSIS — I129 Hypertensive chronic kidney disease with stage 1 through stage 4 chronic kidney disease, or unspecified chronic kidney disease: Secondary | ICD-10-CM | POA: Diagnosis not present

## 2018-06-16 DIAGNOSIS — Z4800 Encounter for change or removal of nonsurgical wound dressing: Secondary | ICD-10-CM | POA: Diagnosis not present

## 2018-06-16 DIAGNOSIS — F0390 Unspecified dementia without behavioral disturbance: Secondary | ICD-10-CM | POA: Diagnosis not present

## 2018-06-16 DIAGNOSIS — E782 Mixed hyperlipidemia: Secondary | ICD-10-CM | POA: Diagnosis not present

## 2018-06-16 DIAGNOSIS — S51801D Unspecified open wound of right forearm, subsequent encounter: Secondary | ICD-10-CM | POA: Diagnosis not present

## 2018-06-16 DIAGNOSIS — Z9181 History of falling: Secondary | ICD-10-CM | POA: Diagnosis not present

## 2018-06-16 DIAGNOSIS — S81801D Unspecified open wound, right lower leg, subsequent encounter: Secondary | ICD-10-CM | POA: Diagnosis not present

## 2018-06-16 DIAGNOSIS — N183 Chronic kidney disease, stage 3 (moderate): Secondary | ICD-10-CM | POA: Diagnosis not present

## 2018-06-23 ENCOUNTER — Encounter: Payer: Self-pay | Admitting: Vascular Surgery

## 2018-06-23 ENCOUNTER — Ambulatory Visit (INDEPENDENT_AMBULATORY_CARE_PROVIDER_SITE_OTHER): Payer: Medicare HMO | Admitting: Vascular Surgery

## 2018-06-23 VITALS — BP 129/86 | HR 96 | Temp 98.0°F | Resp 22 | Ht 62.0 in

## 2018-06-23 DIAGNOSIS — S91301A Unspecified open wound, right foot, initial encounter: Secondary | ICD-10-CM

## 2018-06-23 DIAGNOSIS — I129 Hypertensive chronic kidney disease with stage 1 through stage 4 chronic kidney disease, or unspecified chronic kidney disease: Secondary | ICD-10-CM | POA: Diagnosis not present

## 2018-06-23 DIAGNOSIS — N183 Chronic kidney disease, stage 3 (moderate): Secondary | ICD-10-CM | POA: Diagnosis not present

## 2018-06-23 DIAGNOSIS — F0390 Unspecified dementia without behavioral disturbance: Secondary | ICD-10-CM | POA: Diagnosis not present

## 2018-06-23 DIAGNOSIS — Z4800 Encounter for change or removal of nonsurgical wound dressing: Secondary | ICD-10-CM | POA: Diagnosis not present

## 2018-06-23 DIAGNOSIS — E782 Mixed hyperlipidemia: Secondary | ICD-10-CM | POA: Diagnosis not present

## 2018-06-23 DIAGNOSIS — S81801D Unspecified open wound, right lower leg, subsequent encounter: Secondary | ICD-10-CM | POA: Diagnosis not present

## 2018-06-23 DIAGNOSIS — Z9181 History of falling: Secondary | ICD-10-CM | POA: Diagnosis not present

## 2018-06-23 DIAGNOSIS — S51801D Unspecified open wound of right forearm, subsequent encounter: Secondary | ICD-10-CM | POA: Diagnosis not present

## 2018-06-23 DIAGNOSIS — E039 Hypothyroidism, unspecified: Secondary | ICD-10-CM | POA: Diagnosis not present

## 2018-06-23 NOTE — Progress Notes (Signed)
[facility] [place]  Patient name: Robin Grant MRN: [id number] DOB: May 24, 1931 Sex: female  REASON FOR CONSULT: Evaluation of adequacy of blood flow to left foot with extensive wound left lateral ankle  HPI: Robin Grant is a 84 y.o. female, who is seen today for evaluation of adequacy of flow to left foot.  She is here today with her son and daughter-in-law.  Celebrated her 87th birthday last week.  Her ankle wound dates back to June 19.  She was found unresponsive on her front porch.  It was unknown how long she had been down.  She developed a large blister over her lateral malleolus on her left ankle.  She is subsequently had ongoing difficulty with healing of this.  She has developed a very large eschar is partially separated.  She was seen at [facility] and there was concern regarding adequacy of flow and see me today for further discussion.  Was living independently prior to her event in June.  Now lives with her son and daughter-in-law.  Denies any prior history of claudication  Past Medical History:  Diagnosis Date  . Anxiety   . Chronic constipation   . Dementia   . HTN (hypertension)   . Hyperlipidemia   . Hypothyroidism   . Insomnia     Family History  Problem Relation Age of Onset  . Breast cancer Mother        deceased from MI  . Stroke Father        MI too  . Colon cancer Maternal Aunt   . Rheumatic fever Sister 6       open heart surgery age 37, deceased age 83  . Lung cancer Sister   . Heart attack Sister   . Kidney disease Sister        born with one kidney, required transplant    SOCIAL HISTORY: Social History   Socioeconomic History  . Marital status: Widowed    Spouse name: Not on file  . Number of children: 2  . Years of education: Not on file  . Highest education level: Not on file  Occupational History  . Occupation:  retired    Associate Professor: RETIRED    Comment:  [employer]  . Financial resource strain: Not on file  . Food insecurity:    Worry: Not on file    Inability: Not on file  . Transportation needs:    Medical: Not on file    Non-medical: Not on file  Tobacco Use  . Smoking status: Former Games developer  . Smokeless tobacco: Never Used  Substance and Sexual Activity  . Alcohol use: No  . Drug use: No  . Sexual activity: Never  Lifestyle  . Physical activity:    Days per week: Not on file    Minutes per session: Not on file  . Stress: Not on file  Relationships  . Social connections:    Talks on phone: Not on file    Gets together: Not on file    Attends religious service: Not on file    Active member of club or organization: Not on file    Attends meetings of clubs or organizations: Not on file    Relationship status: Not on file  . Intimate partner violence:    Fear of current or ex partner: Not on file    Emotionally abused: Not on file    Physically abused: Not on  file    Forced sexual activity: Not on file  Other Topics Concern  . Not on file  Social History Narrative  . Not on file    Allergies  Allergen Reactions  . Hydrocodone Nausea And Vomiting  . Codeine Nausea And Vomiting    Current Outpatient Medications  Medication Sig Dispense Refill  . aspirin EC 81 MG tablet Take 1 tablet (81 mg total) by mouth daily. 30 tablet 2  . donepezil (ARICEPT) 5 MG tablet Take 5 mg by mouth daily.    Marland Kitchen. levothyroxine (SYNTHROID, LEVOTHROID) 88 MCG tablet Take 88 mcg by mouth daily.  4  . LORazepam (ATIVAN) 0.5 MG tablet Take 0.5-1 tablets by mouth 2 (two) times daily as needed for anxiety or sleep.   3  . Melatonin 5 MG TABS Take 1 tablet by mouth daily.    . Multiple Vitamin (MULTIVITAMIN) capsule Take 1 capsule by mouth daily.      . ondansetron (ZOFRAN) 4 MG tablet Take 4 mg by mouth every 8 (eight) hours as needed for nausea or vomiting.    . rosuvastatin (CRESTOR) 10 MG tablet Take 10 mg by mouth  daily.    . sertraline (ZOLOFT) 100 MG tablet Take 100 mg by mouth daily.    . potassium chloride SA (K-DUR,KLOR-CON) 20 MEQ tablet Take 1 tablet (20 mEq total) by mouth 2 (two) times daily for 2 days. 4 tablet 0   No current facility-administered medications for this visit.     REVIEW OF SYSTEMS:  [X]  denotes positive finding, [ ]  denotes negative finding Cardiac  Comments:  Chest pain or chest pressure:    Shortness of breath upon exertion:    Short of breath when lying flat:    Irregular heart rhythm:        Vascular    Pain in calf, thigh, or hip brought on by ambulation:    Pain in feet at night that wakes you up from your sleep:     Blood clot in your veins:    Leg swelling:         Pulmonary    Oxygen at home:    Productive cough:     Wheezing:         Neurologic    Sudden weakness in arms or legs:     Sudden numbness in arms or legs:     Sudden onset of difficulty speaking or slurred speech:    Temporary loss of vision in one eye:     Problems with dizziness:         Gastrointestinal    Blood in stool:     Vomited blood:         Genitourinary    Burning when urinating:     Blood in urine:        Psychiatric    Major depression:         Hematologic    Bleeding problems:    Problems with blood clotting too easily:        Skin    Rashes or ulcers:        Constitutional    Fever or chills:      PHYSICAL EXAM: Vitals:   06/23/18 1528 06/23/18 1531  BP: (!) 149/100 129/86  Pulse: 99 96  Resp: (!) 22   Temp: 98 F (36.7 C)   TempSrc: Temporal   Height: 5\' 2"  (1.575 m)     GENERAL: The patient is a well-nourished female, in no  acute distress. The vital signs are documented above. CARDIOVASCULAR: 2+ femoral and 2+ popliteal pulses bilaterally.  He does have a 1-2+ dorsalis pedis pulse on the left PULMONARY: There is good air exchange  ABDOMEN: Soft and non-tender  MUSCULOSKELETAL: There are no major deformities or cyanosis. NEUROLOGIC: No focal  weakness or paresthesias are detected. SKIN: Large ulcer over the lateral aspect of her malleolus with a large eschar present.  See picture below PSYCHIATRIC: The patient has a normal affect.      DATA:  Noninvasive studies reveal ankle arm index of 0.74 on the left and 0.96 on the right  MEDICAL ISSUES: I suspect that this wound was related to her being unresponsive and a pressure sore over her lateral malleolus on the left when she had the event in mid June.  She has infarcted this portion of the skin is now separating this.  This does extend through the skin to the fat.  There is no bone exposed.  She does have a palpable dorsalis pedis pulse so I do not feel that there is any role for revascularization.  I discussed this at length with the patient and her son and daughter-in-law present.  Explained that this will take a great deal of time but should heal with local wound care as long as her does not have bony involvement below the eschar.  I did debride eschar today in the office.  She will be referred back to the wound center in [facility].  We will see her on an as-needed basis   Larina Earthly, MD [facility] Tel [phone] Pager [phone]

## 2018-06-30 DIAGNOSIS — E782 Mixed hyperlipidemia: Secondary | ICD-10-CM | POA: Diagnosis not present

## 2018-06-30 DIAGNOSIS — E039 Hypothyroidism, unspecified: Secondary | ICD-10-CM | POA: Diagnosis not present

## 2018-06-30 DIAGNOSIS — S81801D Unspecified open wound, right lower leg, subsequent encounter: Secondary | ICD-10-CM | POA: Diagnosis not present

## 2018-06-30 DIAGNOSIS — N183 Chronic kidney disease, stage 3 (moderate): Secondary | ICD-10-CM | POA: Diagnosis not present

## 2018-06-30 DIAGNOSIS — S51801D Unspecified open wound of right forearm, subsequent encounter: Secondary | ICD-10-CM | POA: Diagnosis not present

## 2018-06-30 DIAGNOSIS — Z4800 Encounter for change or removal of nonsurgical wound dressing: Secondary | ICD-10-CM | POA: Diagnosis not present

## 2018-06-30 DIAGNOSIS — Z9181 History of falling: Secondary | ICD-10-CM | POA: Diagnosis not present

## 2018-06-30 DIAGNOSIS — F0390 Unspecified dementia without behavioral disturbance: Secondary | ICD-10-CM | POA: Diagnosis not present

## 2018-06-30 DIAGNOSIS — I129 Hypertensive chronic kidney disease with stage 1 through stage 4 chronic kidney disease, or unspecified chronic kidney disease: Secondary | ICD-10-CM | POA: Diagnosis not present

## 2018-07-03 DIAGNOSIS — S91002D Unspecified open wound, left ankle, subsequent encounter: Secondary | ICD-10-CM | POA: Diagnosis not present

## 2018-07-03 DIAGNOSIS — L97929 Non-pressure chronic ulcer of unspecified part of left lower leg with unspecified severity: Secondary | ICD-10-CM | POA: Diagnosis not present

## 2018-07-04 DIAGNOSIS — S51801D Unspecified open wound of right forearm, subsequent encounter: Secondary | ICD-10-CM | POA: Diagnosis not present

## 2018-07-04 DIAGNOSIS — F0390 Unspecified dementia without behavioral disturbance: Secondary | ICD-10-CM | POA: Diagnosis not present

## 2018-07-04 DIAGNOSIS — N183 Chronic kidney disease, stage 3 (moderate): Secondary | ICD-10-CM | POA: Diagnosis not present

## 2018-07-04 DIAGNOSIS — E782 Mixed hyperlipidemia: Secondary | ICD-10-CM | POA: Diagnosis not present

## 2018-07-04 DIAGNOSIS — Z9181 History of falling: Secondary | ICD-10-CM | POA: Diagnosis not present

## 2018-07-04 DIAGNOSIS — E039 Hypothyroidism, unspecified: Secondary | ICD-10-CM | POA: Diagnosis not present

## 2018-07-04 DIAGNOSIS — I129 Hypertensive chronic kidney disease with stage 1 through stage 4 chronic kidney disease, or unspecified chronic kidney disease: Secondary | ICD-10-CM | POA: Diagnosis not present

## 2018-07-04 DIAGNOSIS — Z4800 Encounter for change or removal of nonsurgical wound dressing: Secondary | ICD-10-CM | POA: Diagnosis not present

## 2018-07-04 DIAGNOSIS — S81801D Unspecified open wound, right lower leg, subsequent encounter: Secondary | ICD-10-CM | POA: Diagnosis not present

## 2018-07-07 DIAGNOSIS — N183 Chronic kidney disease, stage 3 (moderate): Secondary | ICD-10-CM | POA: Diagnosis not present

## 2018-07-07 DIAGNOSIS — S51801D Unspecified open wound of right forearm, subsequent encounter: Secondary | ICD-10-CM | POA: Diagnosis not present

## 2018-07-07 DIAGNOSIS — E039 Hypothyroidism, unspecified: Secondary | ICD-10-CM | POA: Diagnosis not present

## 2018-07-07 DIAGNOSIS — S81801D Unspecified open wound, right lower leg, subsequent encounter: Secondary | ICD-10-CM | POA: Diagnosis not present

## 2018-07-07 DIAGNOSIS — E782 Mixed hyperlipidemia: Secondary | ICD-10-CM | POA: Diagnosis not present

## 2018-07-07 DIAGNOSIS — F0390 Unspecified dementia without behavioral disturbance: Secondary | ICD-10-CM | POA: Diagnosis not present

## 2018-07-07 DIAGNOSIS — Z9181 History of falling: Secondary | ICD-10-CM | POA: Diagnosis not present

## 2018-07-07 DIAGNOSIS — Z4800 Encounter for change or removal of nonsurgical wound dressing: Secondary | ICD-10-CM | POA: Diagnosis not present

## 2018-07-07 DIAGNOSIS — I129 Hypertensive chronic kidney disease with stage 1 through stage 4 chronic kidney disease, or unspecified chronic kidney disease: Secondary | ICD-10-CM | POA: Diagnosis not present

## 2018-07-14 DIAGNOSIS — E039 Hypothyroidism, unspecified: Secondary | ICD-10-CM | POA: Diagnosis not present

## 2018-07-14 DIAGNOSIS — Z9181 History of falling: Secondary | ICD-10-CM | POA: Diagnosis not present

## 2018-07-14 DIAGNOSIS — N183 Chronic kidney disease, stage 3 (moderate): Secondary | ICD-10-CM | POA: Diagnosis not present

## 2018-07-14 DIAGNOSIS — S51801D Unspecified open wound of right forearm, subsequent encounter: Secondary | ICD-10-CM | POA: Diagnosis not present

## 2018-07-14 DIAGNOSIS — Z4800 Encounter for change or removal of nonsurgical wound dressing: Secondary | ICD-10-CM | POA: Diagnosis not present

## 2018-07-14 DIAGNOSIS — S81801D Unspecified open wound, right lower leg, subsequent encounter: Secondary | ICD-10-CM | POA: Diagnosis not present

## 2018-07-14 DIAGNOSIS — I129 Hypertensive chronic kidney disease with stage 1 through stage 4 chronic kidney disease, or unspecified chronic kidney disease: Secondary | ICD-10-CM | POA: Diagnosis not present

## 2018-07-14 DIAGNOSIS — E782 Mixed hyperlipidemia: Secondary | ICD-10-CM | POA: Diagnosis not present

## 2018-07-14 DIAGNOSIS — F0390 Unspecified dementia without behavioral disturbance: Secondary | ICD-10-CM | POA: Diagnosis not present

## 2018-07-21 DIAGNOSIS — E039 Hypothyroidism, unspecified: Secondary | ICD-10-CM | POA: Diagnosis not present

## 2018-07-21 DIAGNOSIS — S81801D Unspecified open wound, right lower leg, subsequent encounter: Secondary | ICD-10-CM | POA: Diagnosis not present

## 2018-07-21 DIAGNOSIS — Z23 Encounter for immunization: Secondary | ICD-10-CM | POA: Diagnosis not present

## 2018-07-21 DIAGNOSIS — E782 Mixed hyperlipidemia: Secondary | ICD-10-CM | POA: Diagnosis not present

## 2018-07-21 DIAGNOSIS — Z9181 History of falling: Secondary | ICD-10-CM | POA: Diagnosis not present

## 2018-07-21 DIAGNOSIS — I129 Hypertensive chronic kidney disease with stage 1 through stage 4 chronic kidney disease, or unspecified chronic kidney disease: Secondary | ICD-10-CM | POA: Diagnosis not present

## 2018-07-21 DIAGNOSIS — Z4800 Encounter for change or removal of nonsurgical wound dressing: Secondary | ICD-10-CM | POA: Diagnosis not present

## 2018-07-21 DIAGNOSIS — F0390 Unspecified dementia without behavioral disturbance: Secondary | ICD-10-CM | POA: Diagnosis not present

## 2018-07-21 DIAGNOSIS — R079 Chest pain, unspecified: Secondary | ICD-10-CM | POA: Diagnosis not present

## 2018-07-21 DIAGNOSIS — S51801D Unspecified open wound of right forearm, subsequent encounter: Secondary | ICD-10-CM | POA: Diagnosis not present

## 2018-07-21 DIAGNOSIS — N183 Chronic kidney disease, stage 3 (moderate): Secondary | ICD-10-CM | POA: Diagnosis not present

## 2018-07-28 ENCOUNTER — Encounter: Payer: Self-pay | Admitting: Surgery

## 2018-07-28 DIAGNOSIS — Z9181 History of falling: Secondary | ICD-10-CM | POA: Diagnosis not present

## 2018-07-28 DIAGNOSIS — N183 Chronic kidney disease, stage 3 (moderate): Secondary | ICD-10-CM | POA: Diagnosis not present

## 2018-07-28 DIAGNOSIS — S51801D Unspecified open wound of right forearm, subsequent encounter: Secondary | ICD-10-CM | POA: Diagnosis not present

## 2018-07-28 DIAGNOSIS — Z4800 Encounter for change or removal of nonsurgical wound dressing: Secondary | ICD-10-CM | POA: Diagnosis not present

## 2018-07-28 DIAGNOSIS — I129 Hypertensive chronic kidney disease with stage 1 through stage 4 chronic kidney disease, or unspecified chronic kidney disease: Secondary | ICD-10-CM | POA: Diagnosis not present

## 2018-07-28 DIAGNOSIS — E039 Hypothyroidism, unspecified: Secondary | ICD-10-CM | POA: Diagnosis not present

## 2018-07-28 DIAGNOSIS — E782 Mixed hyperlipidemia: Secondary | ICD-10-CM | POA: Diagnosis not present

## 2018-07-28 DIAGNOSIS — S81801D Unspecified open wound, right lower leg, subsequent encounter: Secondary | ICD-10-CM | POA: Diagnosis not present

## 2018-07-28 DIAGNOSIS — F0390 Unspecified dementia without behavioral disturbance: Secondary | ICD-10-CM | POA: Diagnosis not present

## 2018-08-05 DIAGNOSIS — E039 Hypothyroidism, unspecified: Secondary | ICD-10-CM | POA: Diagnosis not present

## 2018-08-05 DIAGNOSIS — F0390 Unspecified dementia without behavioral disturbance: Secondary | ICD-10-CM | POA: Diagnosis not present

## 2018-08-05 DIAGNOSIS — I129 Hypertensive chronic kidney disease with stage 1 through stage 4 chronic kidney disease, or unspecified chronic kidney disease: Secondary | ICD-10-CM | POA: Diagnosis not present

## 2018-08-05 DIAGNOSIS — N183 Chronic kidney disease, stage 3 (moderate): Secondary | ICD-10-CM | POA: Diagnosis not present

## 2018-08-05 DIAGNOSIS — S81801D Unspecified open wound, right lower leg, subsequent encounter: Secondary | ICD-10-CM | POA: Diagnosis not present

## 2018-08-05 DIAGNOSIS — E782 Mixed hyperlipidemia: Secondary | ICD-10-CM | POA: Diagnosis not present

## 2018-08-05 DIAGNOSIS — Z9181 History of falling: Secondary | ICD-10-CM | POA: Diagnosis not present

## 2018-08-05 DIAGNOSIS — S51801D Unspecified open wound of right forearm, subsequent encounter: Secondary | ICD-10-CM | POA: Diagnosis not present

## 2018-08-05 DIAGNOSIS — Z4800 Encounter for change or removal of nonsurgical wound dressing: Secondary | ICD-10-CM | POA: Diagnosis not present

## 2018-08-11 DIAGNOSIS — N183 Chronic kidney disease, stage 3 (moderate): Secondary | ICD-10-CM | POA: Diagnosis not present

## 2018-08-11 DIAGNOSIS — S81801D Unspecified open wound, right lower leg, subsequent encounter: Secondary | ICD-10-CM | POA: Diagnosis not present

## 2018-08-11 DIAGNOSIS — S51801A Unspecified open wound of right forearm, initial encounter: Secondary | ICD-10-CM | POA: Diagnosis not present

## 2018-08-11 DIAGNOSIS — Z9181 History of falling: Secondary | ICD-10-CM | POA: Diagnosis not present

## 2018-08-11 DIAGNOSIS — R531 Weakness: Secondary | ICD-10-CM | POA: Diagnosis not present

## 2018-09-10 IMAGING — CT CT CERVICAL SPINE W/O CM
4 of 7 series · 15 of 33 positions shown, 16 images · non-contrast
Comparison: None.

CLINICAL DATA: Patient found down.  Trauma.  Altered mental status.

EXAM:
CT HEAD WITHOUT CONTRAST
CT CERVICAL SPINE WITHOUT CONTRAST
TECHNIQUE: Multidetector CT imaging of the head and cervical spine was
performed following the standard protocol without intravenous
contrast. Multiplanar CT image reconstructions of the cervical spine
were also generated.

[Series 5: coronal soft tissue · coronal · 0.32mm/px · 3 of 69 slices shown]
[im 18/69  bone]
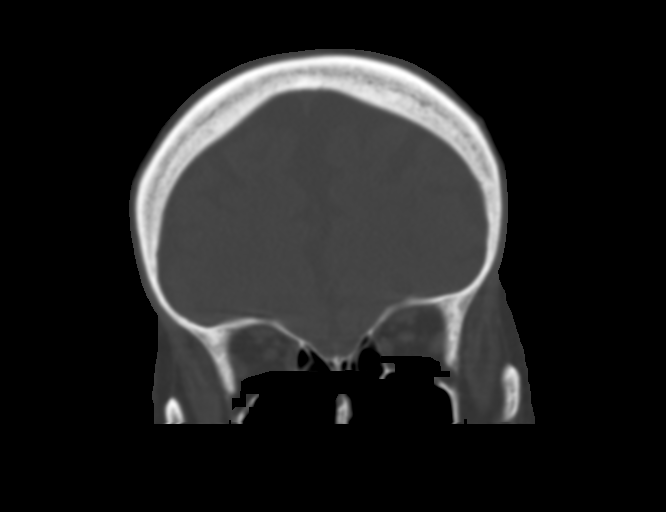
[im 35/69  bone]
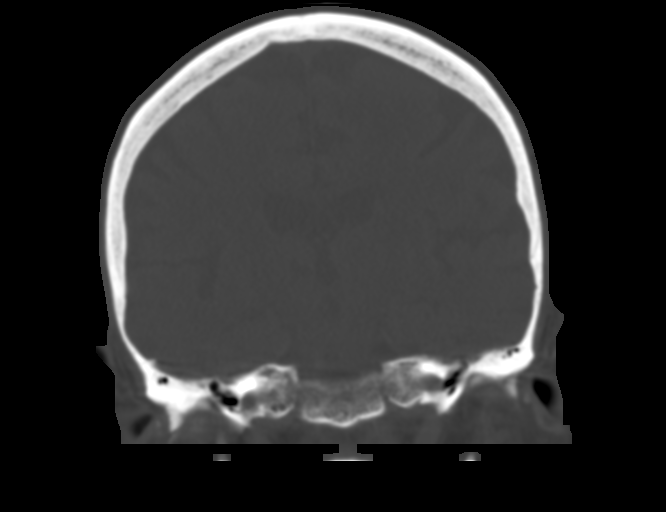
[im 52/69  bone]
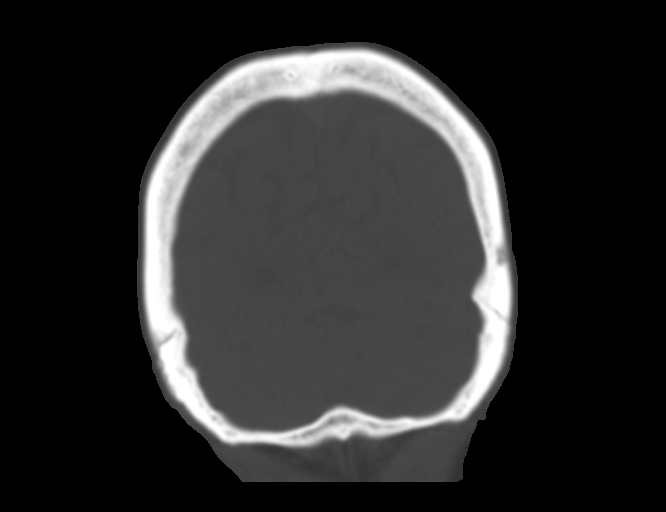

[Series 8: c spine soft · axial · 0.41mm/px · z∈[-758,-642]mm · 4 of 85 slices shown]
[im 17/85  soft-tissue]
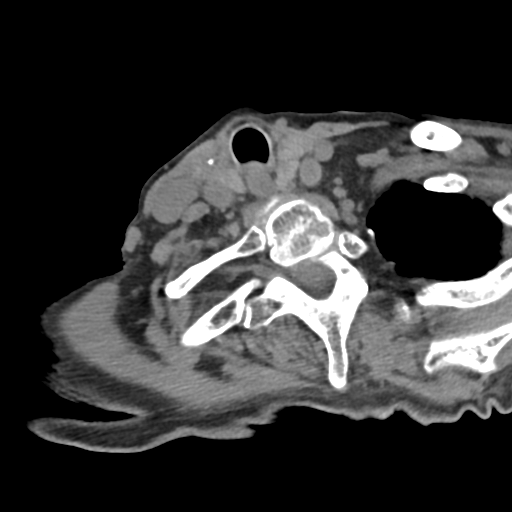
[im 34/85  soft-tissue]
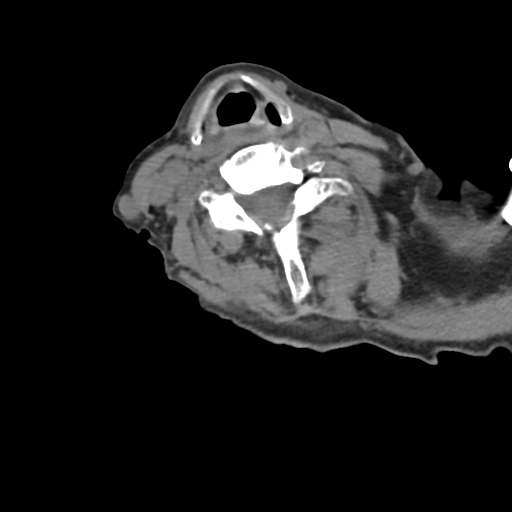
[im 51/85  soft-tissue]
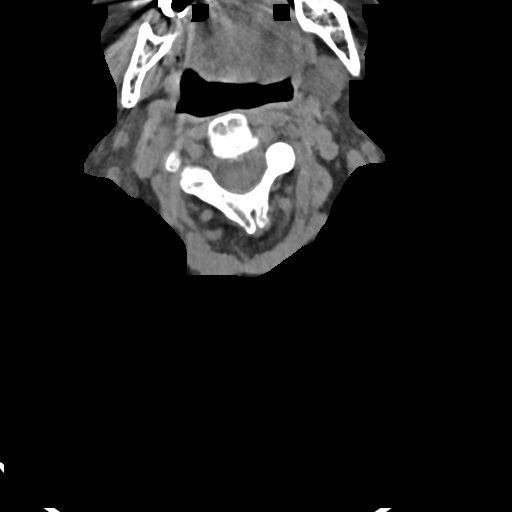
[im 68/85  soft-tissue]
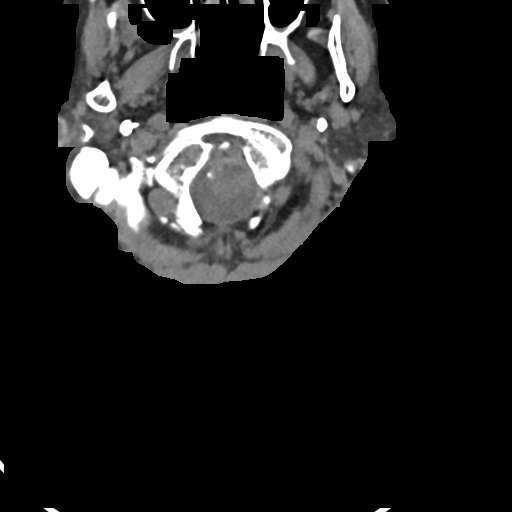

[Series 9: sagittal bone · sagittal · 0.30mm/px · 4 of 61 slices shown]
[im 13/61  bone]
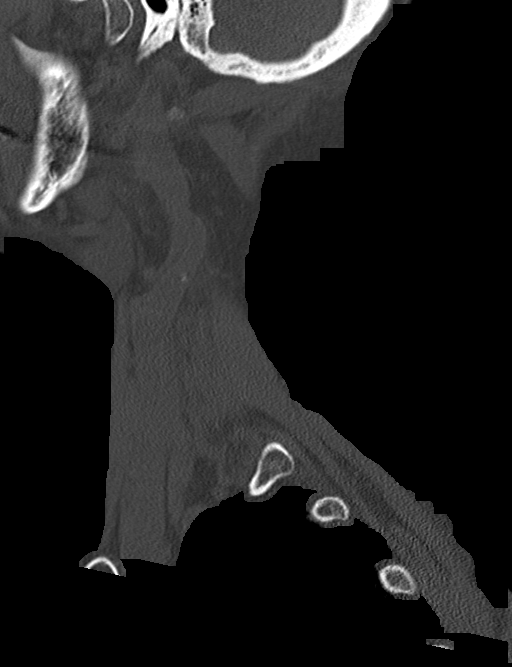
[im 25/61  bone]
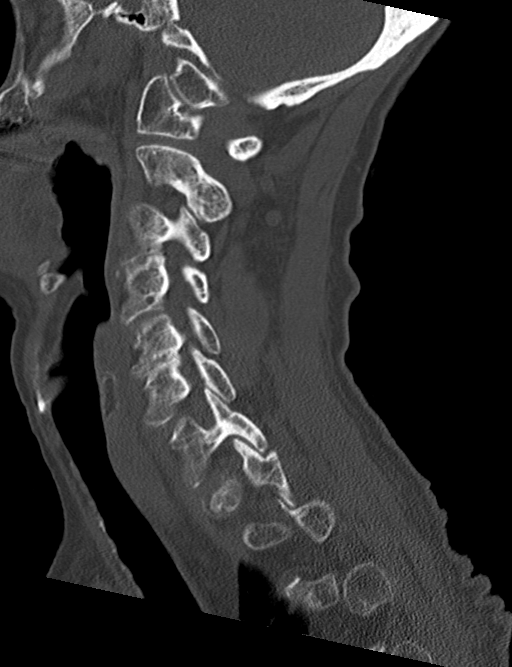
[im 37/61  bone]
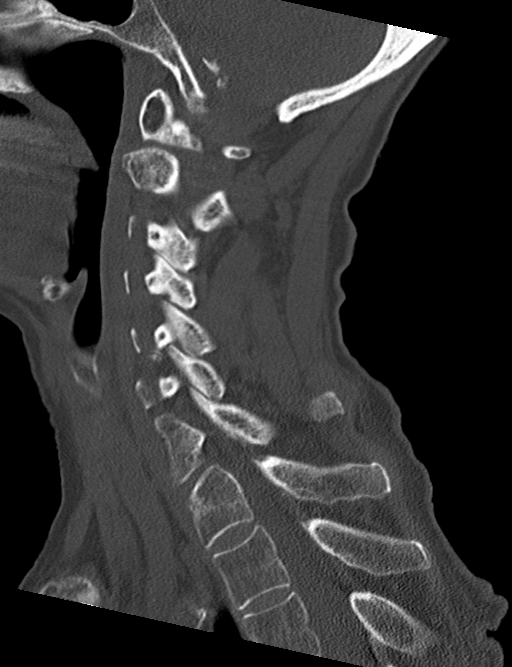
[im 49/61  bone]
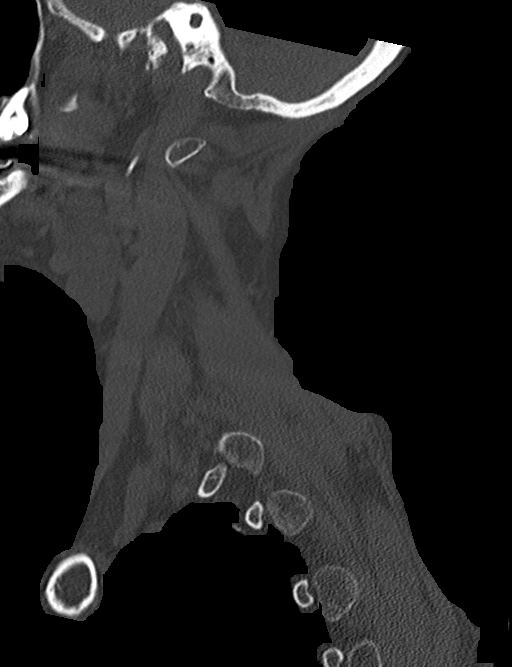

[Series 11: orthogonal bone · axial · 0.23mm/px · z∈[-798,-694]mm · 4 of 99 slices shown, 5 images]
[im 20/99  soft-tissue]
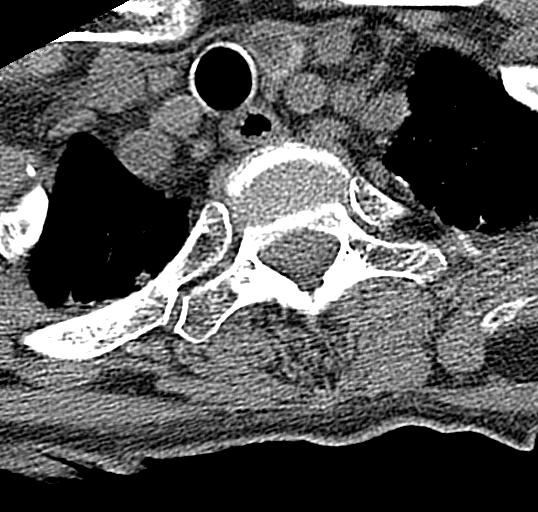
[im 20/99  bone]
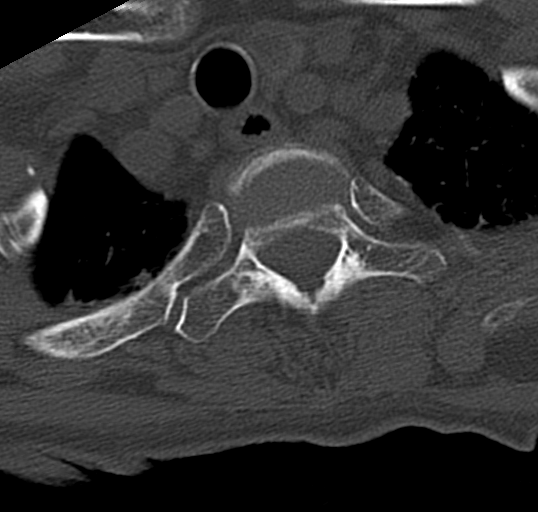
[im 40/99  bone]
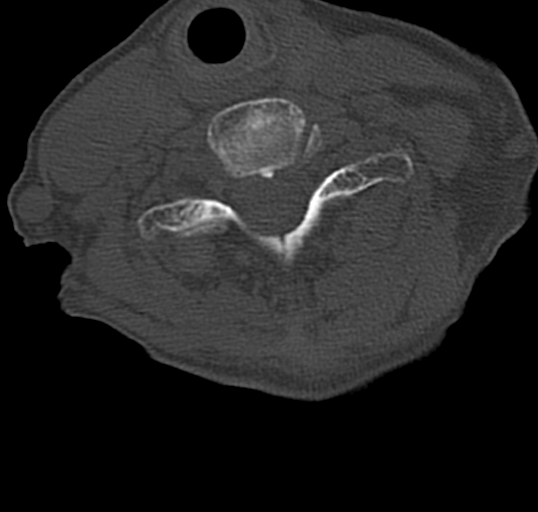
[im 59/99  bone]
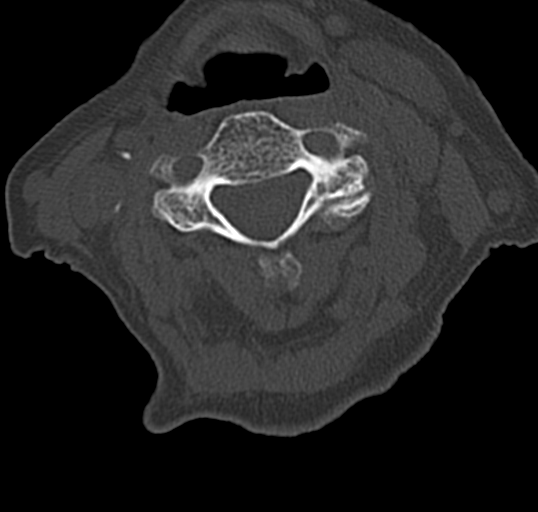
[im 79/99  bone]
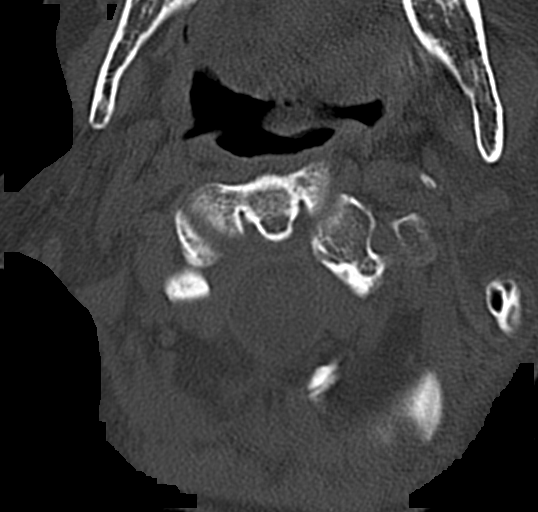

[15 of 33 positions shown; findings below may reference images not displayed]

FINDINGS: CT HEAD FINDINGS

Brain: No subdural, epidural, or subarachnoid hemorrhage. Scattered
white matter changes are centrally stable. A right thalamic lacunar
infarct is stable. The ventricles and sulci are unchanged.
Cerebellum, brainstem, and basal cisterns are normal. No mass effect
or midline shift.

Vascular: Calcified atherosclerosis in the intracranial carotids.

Skull: Normal. Negative for fracture or focal lesion.

Sinuses/Orbits: A tiny amount of fluid is seen posteriorly in both
maxillary sinuses. Paranasal sinuses, mastoid air cells, and middle
ears are otherwise normal in well aerated.

Other: None.

CT CERVICAL SPINE FINDINGS

Alignment: Normal.

Skull base and vertebrae: Incomplete fusion of the left side of the
C1 posterior arch, unchanged. No acute fracture.

Soft tissues and spinal canal: No prevertebral fluid or swelling. No
visible canal hematoma.

Disc levels: Mild degenerative changes most prominent at C5-6 with
tiny anterior and posterior osteophytes. The upper cervical spine
facet degenerative changes noted.

Upper chest: Negative.

Other: No other abnormalities identified.
IMPRESSION: 1. Chronic white matter changes in the brain. No acute intracranial
abnormality.
2. There is a tiny amount of fluid in the posterior maxillary
sinuses which is nonspecific. Recommend clinical correlation.
3. No fracture or traumatic malalignment in the cervical spine. Mild
degenerative changes.

## 2019-06-01 DIAGNOSIS — H5200 Hypermetropia, unspecified eye: Secondary | ICD-10-CM | POA: Diagnosis not present

## 2019-12-21 ENCOUNTER — Encounter (HOSPITAL_COMMUNITY): Payer: Self-pay

## 2019-12-21 ENCOUNTER — Other Ambulatory Visit: Payer: Self-pay

## 2019-12-21 ENCOUNTER — Emergency Department (HOSPITAL_COMMUNITY): Payer: Medicare HMO

## 2019-12-21 ENCOUNTER — Inpatient Hospital Stay (HOSPITAL_COMMUNITY)
Admission: EM | Admit: 2019-12-21 | Discharge: 2019-12-24 | DRG: 522 | Disposition: A | Discharge status: Home Health | Payer: Medicare HMO | Attending: Internal Medicine | Admitting: Internal Medicine

## 2019-12-21 DIAGNOSIS — Z471 Aftercare following joint replacement surgery: Secondary | ICD-10-CM | POA: Diagnosis not present

## 2019-12-21 DIAGNOSIS — S72002A Fracture of unspecified part of neck of left femur, initial encounter for closed fracture: Principal | ICD-10-CM | POA: Diagnosis present

## 2019-12-21 DIAGNOSIS — Z9181 History of falling: Secondary | ICD-10-CM

## 2019-12-21 DIAGNOSIS — Z79899 Other long term (current) drug therapy: Secondary | ICD-10-CM

## 2019-12-21 DIAGNOSIS — Z841 Family history of disorders of kidney and ureter: Secondary | ICD-10-CM

## 2019-12-21 DIAGNOSIS — Z20822 Contact with and (suspected) exposure to covid-19: Secondary | ICD-10-CM | POA: Diagnosis present

## 2019-12-21 DIAGNOSIS — E039 Hypothyroidism, unspecified: Secondary | ICD-10-CM | POA: Diagnosis not present

## 2019-12-21 DIAGNOSIS — D62 Acute posthemorrhagic anemia: Secondary | ICD-10-CM | POA: Diagnosis not present

## 2019-12-21 DIAGNOSIS — R296 Repeated falls: Secondary | ICD-10-CM | POA: Diagnosis present

## 2019-12-21 DIAGNOSIS — S72009A Fracture of unspecified part of neck of unspecified femur, initial encounter for closed fracture: Secondary | ICD-10-CM | POA: Diagnosis present

## 2019-12-21 DIAGNOSIS — I129 Hypertensive chronic kidney disease with stage 1 through stage 4 chronic kidney disease, or unspecified chronic kidney disease: Secondary | ICD-10-CM | POA: Diagnosis not present

## 2019-12-21 DIAGNOSIS — Y929 Unspecified place or not applicable: Secondary | ICD-10-CM | POA: Diagnosis not present

## 2019-12-21 DIAGNOSIS — N179 Acute kidney failure, unspecified: Secondary | ICD-10-CM | POA: Diagnosis present

## 2019-12-21 DIAGNOSIS — Z87891 Personal history of nicotine dependence: Secondary | ICD-10-CM | POA: Diagnosis not present

## 2019-12-21 DIAGNOSIS — F0390 Unspecified dementia without behavioral disturbance: Secondary | ICD-10-CM | POA: Diagnosis not present

## 2019-12-21 DIAGNOSIS — N1832 Chronic kidney disease, stage 3b: Secondary | ICD-10-CM | POA: Diagnosis present

## 2019-12-21 DIAGNOSIS — Z803 Family history of malignant neoplasm of breast: Secondary | ICD-10-CM

## 2019-12-21 DIAGNOSIS — K219 Gastro-esophageal reflux disease without esophagitis: Secondary | ICD-10-CM | POA: Diagnosis not present

## 2019-12-21 DIAGNOSIS — N183 Chronic kidney disease, stage 3 unspecified: Secondary | ICD-10-CM | POA: Diagnosis not present

## 2019-12-21 DIAGNOSIS — Z800 Family history of malignant neoplasm of digestive organs: Secondary | ICD-10-CM | POA: Diagnosis not present

## 2019-12-21 DIAGNOSIS — Z419 Encounter for procedure for purposes other than remedying health state, unspecified: Secondary | ICD-10-CM

## 2019-12-21 DIAGNOSIS — Z885 Allergy status to narcotic agent status: Secondary | ICD-10-CM

## 2019-12-21 DIAGNOSIS — Z79890 Hormone replacement therapy (postmenopausal): Secondary | ICD-10-CM

## 2019-12-21 DIAGNOSIS — E872 Acidosis: Secondary | ICD-10-CM | POA: Diagnosis not present

## 2019-12-21 DIAGNOSIS — F419 Anxiety disorder, unspecified: Secondary | ICD-10-CM | POA: Diagnosis present

## 2019-12-21 DIAGNOSIS — Z96642 Presence of left artificial hip joint: Secondary | ICD-10-CM

## 2019-12-21 DIAGNOSIS — E785 Hyperlipidemia, unspecified: Secondary | ICD-10-CM | POA: Diagnosis not present

## 2019-12-21 DIAGNOSIS — W19XXXA Unspecified fall, initial encounter: Secondary | ICD-10-CM | POA: Diagnosis present

## 2019-12-21 DIAGNOSIS — Z823 Family history of stroke: Secondary | ICD-10-CM

## 2019-12-21 DIAGNOSIS — Z8249 Family history of ischemic heart disease and other diseases of the circulatory system: Secondary | ICD-10-CM

## 2019-12-21 DIAGNOSIS — M25562 Pain in left knee: Secondary | ICD-10-CM | POA: Diagnosis not present

## 2019-12-21 DIAGNOSIS — S8992XA Unspecified injury of left lower leg, initial encounter: Secondary | ICD-10-CM | POA: Diagnosis not present

## 2019-12-21 DIAGNOSIS — I10 Essential (primary) hypertension: Secondary | ICD-10-CM | POA: Diagnosis present

## 2019-12-21 DIAGNOSIS — Z801 Family history of malignant neoplasm of trachea, bronchus and lung: Secondary | ICD-10-CM | POA: Diagnosis not present

## 2019-12-21 DIAGNOSIS — S72002D Fracture of unspecified part of neck of left femur, subsequent encounter for closed fracture with routine healing: Secondary | ICD-10-CM | POA: Diagnosis not present

## 2019-12-21 DIAGNOSIS — S299XXA Unspecified injury of thorax, initial encounter: Secondary | ICD-10-CM | POA: Diagnosis not present

## 2019-12-21 DIAGNOSIS — D631 Anemia in chronic kidney disease: Secondary | ICD-10-CM | POA: Diagnosis not present

## 2019-12-21 DIAGNOSIS — M12552 Traumatic arthropathy, left hip: Secondary | ICD-10-CM | POA: Diagnosis not present

## 2019-12-21 LAB — COMPREHENSIVE METABOLIC PANEL
ALT: 18 U/L (ref 0–44)
AST: 21 U/L (ref 15–41)
Albumin: 3 g/dL — ABNORMAL LOW (ref 3.5–5.0)
Alkaline Phosphatase: 148 U/L — ABNORMAL HIGH (ref 38–126)
Anion gap: 12 (ref 5–15)
BUN: 44 mg/dL — ABNORMAL HIGH (ref 8–23)
CO2: 18 mmol/L — ABNORMAL LOW (ref 22–32)
Calcium: 9.1 mg/dL (ref 8.9–10.3)
Chloride: 107 mmol/L (ref 98–111)
Creatinine, Ser: 2.02 mg/dL — ABNORMAL HIGH (ref 0.44–1.00)
GFR calc Af Amer: 25 mL/min — ABNORMAL LOW (ref >60)
GFR calc non Af Amer: 22 mL/min — ABNORMAL LOW (ref >60)
Glucose, Bld: 90 mg/dL (ref 70–99)
Potassium: 4 mmol/L (ref 3.5–5.1)
Sodium: 137 mmol/L (ref 135–145)
Total Bilirubin: 2 mg/dL — ABNORMAL HIGH (ref 0.3–1.2)
Total Protein: 6.6 g/dL (ref 6.5–8.1)

## 2019-12-21 LAB — CBC WITH DIFFERENTIAL/PLATELET
Abs Immature Granulocytes: 0.35 10*3/uL — ABNORMAL HIGH (ref 0.00–0.07)
Basophils Absolute: 0 10*3/uL (ref 0.0–0.1)
Basophils Relative: 1 %
Eosinophils Absolute: 0.2 10*3/uL (ref 0.0–0.5)
Eosinophils Relative: 3 %
HCT: 41.9 % (ref 36.0–46.0)
Hemoglobin: 13.2 g/dL (ref 12.0–15.0)
Immature Granulocytes: 5 %
Lymphocytes Relative: 10 %
Lymphs Abs: 0.7 10*3/uL (ref 0.7–4.0)
MCH: 29.7 pg (ref 26.0–34.0)
MCHC: 31.5 g/dL (ref 30.0–36.0)
MCV: 94.4 fL (ref 80.0–100.0)
Monocytes Absolute: 0.7 10*3/uL (ref 0.1–1.0)
Monocytes Relative: 10 %
Neutro Abs: 4.8 10*3/uL (ref 1.7–7.7)
Neutrophils Relative %: 71 %
Platelets: 251 10*3/uL (ref 150–400)
RBC: 4.44 MIL/uL (ref 3.87–5.11)
RDW: 14.6 % (ref 11.5–15.5)
WBC: 6.8 10*3/uL (ref 4.0–10.5)
nRBC: 0 % (ref 0.0–0.2)

## 2019-12-21 LAB — RESPIRATORY PANEL BY RT PCR (FLU A&B, COVID)
Influenza A by PCR: NEGATIVE
Influenza B by PCR: NEGATIVE
SARS Coronavirus 2 by RT PCR: NEGATIVE

## 2019-12-21 LAB — TYPE AND SCREEN
ABO/RH(D): O POS
Antibody Screen: NEGATIVE

## 2019-12-21 LAB — CK: Total CK: 125 U/L (ref 38–234)

## 2019-12-21 MED ORDER — ADULT MULTIVITAMIN W/MINERALS CH
1.0000 | ORAL_TABLET | Freq: Every day | ORAL | Status: DC
  Administered 2019-12-21 – 2019-12-24 (×4): 1 via ORAL
  Filled 2019-12-21 (×4): qty 1

## 2019-12-21 MED ORDER — MELATONIN 3 MG PO TABS
3.0000 mg | ORAL_TABLET | Freq: Every day | ORAL | Status: DC
  Administered 2019-12-21 – 2019-12-23 (×3): 3 mg via ORAL
  Filled 2019-12-21 (×5): qty 1

## 2019-12-21 MED ORDER — TRANEXAMIC ACID-NACL 1000-0.7 MG/100ML-% IV SOLN
1000.0000 mg | INTRAVENOUS | Status: AC
  Administered 2019-12-22: 1000 mg via INTRAVENOUS
  Filled 2019-12-21: qty 100

## 2019-12-21 MED ORDER — ENSURE PRE-SURGERY PO LIQD
296.0000 mL | Freq: Once | ORAL | Status: AC
  Administered 2019-12-22: 296 mL via ORAL
  Filled 2019-12-21: qty 296

## 2019-12-21 MED ORDER — ONDANSETRON HCL 4 MG PO TABS
4.0000 mg | ORAL_TABLET | Freq: Three times a day (TID) | ORAL | Status: DC | PRN
  Administered 2019-12-24: 4 mg via ORAL
  Filled 2019-12-21: qty 1

## 2019-12-21 MED ORDER — DONEPEZIL HCL 5 MG PO TABS
5.0000 mg | ORAL_TABLET | Freq: Every day | ORAL | Status: DC
  Administered 2019-12-21 – 2019-12-23 (×3): 5 mg via ORAL
  Filled 2019-12-21 (×5): qty 1

## 2019-12-21 MED ORDER — LORAZEPAM 0.5 MG PO TABS: 0.2500 mg | ORAL_TABLET | Freq: Two times a day (BID) | ORAL | Status: DC | PRN

## 2019-12-21 MED ORDER — CHLORHEXIDINE GLUCONATE 4 % EX LIQD
60.0000 mL | Freq: Once | CUTANEOUS | Status: DC
  Filled 2019-12-21: qty 60

## 2019-12-21 MED ORDER — LACTATED RINGERS IV SOLN: INTRAVENOUS | Status: AC

## 2019-12-21 MED ORDER — SODIUM BICARBONATE 650 MG PO TABS
650.0000 mg | ORAL_TABLET | Freq: Two times a day (BID) | ORAL | Status: DC
  Administered 2019-12-21 – 2019-12-24 (×7): 650 mg via ORAL
  Filled 2019-12-21 (×11): qty 1

## 2019-12-21 MED ORDER — LEVOTHYROXINE SODIUM 88 MCG PO TABS
88.0000 ug | ORAL_TABLET | Freq: Every day | ORAL | Status: DC
  Administered 2019-12-22 – 2019-12-24 (×3): 88 ug via ORAL
  Filled 2019-12-21 (×5): qty 1

## 2019-12-21 MED ORDER — ONDANSETRON HCL 4 MG PO TABS: 4.0000 mg | ORAL_TABLET | Freq: Four times a day (QID) | ORAL | Status: DC | PRN

## 2019-12-21 MED ORDER — HEPARIN SODIUM (PORCINE) 5000 UNIT/ML IJ SOLN
5000.0000 [IU] | Freq: Three times a day (TID) | INTRAMUSCULAR | Status: DC
  Administered 2019-12-21 – 2019-12-24 (×8): 5000 [IU] via SUBCUTANEOUS
  Filled 2019-12-21 (×8): qty 1

## 2019-12-21 MED ORDER — SERTRALINE HCL 100 MG PO TABS
100.0000 mg | ORAL_TABLET | Freq: Every day | ORAL | Status: DC
  Administered 2019-12-21 – 2019-12-24 (×4): 100 mg via ORAL
  Filled 2019-12-21 (×2): qty 1
  Filled 2019-12-21: qty 2
  Filled 2019-12-21: qty 1

## 2019-12-21 MED ORDER — FENTANYL CITRATE (PF) 100 MCG/2ML IJ SOLN
50.0000 ug | Freq: Once | INTRAMUSCULAR | Status: AC
  Administered 2019-12-21: 50 ug via INTRAVENOUS
  Filled 2019-12-21: qty 2

## 2019-12-21 MED ORDER — POVIDONE-IODINE 10 % EX SWAB: 2.0000 "application " | Freq: Once | CUTANEOUS | Status: DC

## 2019-12-21 MED ORDER — ACETAMINOPHEN 325 MG PO TABS: 650.0000 mg | ORAL_TABLET | Freq: Four times a day (QID) | ORAL | Status: DC | PRN

## 2019-12-21 MED ORDER — FENTANYL CITRATE (PF) 100 MCG/2ML IJ SOLN
25.0000 ug | INTRAMUSCULAR | Status: DC | PRN
  Administered 2019-12-21: 25 ug via INTRAVENOUS
  Filled 2019-12-21: qty 2

## 2019-12-21 MED ORDER — ACETAMINOPHEN 650 MG RE SUPP: 650.0000 mg | Freq: Four times a day (QID) | RECTAL | Status: DC | PRN

## 2019-12-21 MED ORDER — CEFAZOLIN SODIUM-DEXTROSE 2-4 GM/100ML-% IV SOLN
2.0000 g | INTRAVENOUS | Status: AC
  Administered 2019-12-22: 2 g via INTRAVENOUS
  Filled 2019-12-21: qty 100

## 2019-12-21 MED ORDER — ONDANSETRON HCL 4 MG/2ML IJ SOLN: 4.0000 mg | Freq: Four times a day (QID) | INTRAMUSCULAR | Status: DC | PRN

## 2019-12-21 NOTE — ED Notes (Addendum)
Patient found sitting in the floor at the foot of the bed by [place], phlebotomist. Pt appears uninjured. Denies pain from fall. Pt assisted back to by this nurse, Delorise Jackson and Jimmey Ralph, NT's. Patient moved to room 14 so she can be visualized. Family members notified of event and asked to sit with patient. Phillips Hay, family member, agreed to sit with patient.  Benjiman Core, MD has assessed patient. Safety Zone submitted on fall event.

## 2019-12-21 NOTE — ED Triage Notes (Signed)
Pt presents to ED with left hip pain and left knee pain following unwitnessed fall on Wednesday.

## 2019-12-21 NOTE — ED Provider Notes (Signed)
  Physical Exam  BP 125/82 (BP Location: Left Arm)   Pulse (!) 103   Temp 97.8 F (36.6 C) (Oral)   Resp 20   Ht 5\' 2"  (1.575 m)   Wt 49.9 kg   SpO2 99%   BMI 20.12 kg/m   Physical Exam  ED Course/Procedures     Procedures  MDM      Called to see patient.  Reportedly had fallen in her room here.  Patient only complaining of mild pain in her left hip.  No other sign of trauma on her head neck or other extremities.  Does not appear to need further imaging at this time.  Continue admission plan.   , MD 12/21/19 [phone]

## 2019-12-21 NOTE — H&P (Signed)
History and Physical    Robin Grant:[id number] DOB: 03/16/1931 DOA: 12/21/2019  PCP: Asencion Noble, MD   Patient coming from: Home  Chief Complaint: Left hip and knee pain status post fall  HPI: Robin Grant is a 86 y.o. female with medical history significant for dementia, dyslipidemia, hypothyroidism, and CKD stage IIIb who presented to the ED with multiple falls over this past week.  She originally fell on Wednesday of last week, approximately 5 days ago, and has not been able to walk right since that time.  Family members did not notice much in the way of bruising or any other injury and therefore, figure that she did not have a significant injury.  The patient, however has been having persistent pain since then which has been worsening.  She has no other complaints at this time.  Her pain appears to be well controlled.   ED Course: Stable vital signs noted with laboratory data remarkable for BUN 44 and creatinine 2.02 and bicarb of 18.  She is pleasantly confused and cannot give me any meaningful history.  She is noted to have left femoral neck fracture with varus angulation noted on pelvic films.  She has been given some fentanyl with good relief of pain noted.  Review of Systems: Cannot be obtained given patient condition.  Past Medical History:  Diagnosis Date  . Anxiety   . Chronic constipation   . Dementia ([place])   . HTN (hypertension)   . Hyperlipidemia   . Hypothyroidism   . Insomnia     Past Surgical History:  Procedure Laterality Date  . APPENDECTOMY    . COLONOSCOPY  2007   Dr. Gala Romney, pancolonic diverticula.  . COLONOSCOPY N/A 07/28/2013   Procedure: COLONOSCOPY;  Surgeon: Danie Binder, MD;  Location: [facility];  Service: Endoscopy;  Laterality: N/A;  PLEASE SCHEDULE AT 3 PM PER DR. FIELDS   . ESOPHAGOGASTRODUODENOSCOPY  05/21/2011   Dr. Gala Romney: normal tubular esophagus, s/p empiric dilation with 49 F, innocent appearing AVM, small hiatal hernia  . GIVENS  CAPSULE STUDY N/A 07/30/2013   Procedure: GIVENS CAPSULE STUDY;  Surgeon: Daneil Dolin, MD;  Location: [facility];  Service: Endoscopy;  Laterality: N/A;  . PORT-A-CATH REMOVAL  2008  . right ankle surgery  2007   osteomyelitis  . TUBAL LIGATION       reports that she has quit smoking. She has never used smokeless tobacco. She reports that she does not drink alcohol or use drugs.  Allergies  Allergen Reactions  . Hydrocodone Nausea And Vomiting  . Codeine Nausea And Vomiting    Family History  Problem Relation Age of Onset  . Breast cancer Mother        deceased from MI  . Stroke Father        MI too  . Colon cancer Maternal Aunt   . Rheumatic fever Sister 54       open heart surgery age 52, deceased age 43  . Lung cancer Sister   . Heart attack Sister   . Kidney disease Sister        born with one kidney, required transplant    Prior to Admission medications   Medication Sig Start Date End Date Taking? Authorizing Provider  donepezil (ARICEPT) 5 MG tablet Take 5 mg by mouth daily.    [provider]  levothyroxine (SYNTHROID, LEVOTHROID) 88 MCG tablet Take 88 mcg by mouth daily. 05/28/17   [provider]  LORazepam (ATIVAN) 0.5 MG tablet Take 0.5-1 tablets by mouth 2 (two) times daily as needed for anxiety or sleep.  04/18/17   [provider]  Melatonin 5 MG TABS Take 1 tablet by mouth daily.    [provider]  Multiple Vitamin (MULTIVITAMIN) capsule Take 1 capsule by mouth daily.      [provider]  ondansetron (ZOFRAN) 4 MG tablet Take 4 mg by mouth every 8 (eight) hours as needed for nausea or vomiting.    [provider]  potassium chloride SA (K-DUR,KLOR-CON) 20 MEQ tablet Take 1 tablet (20 mEq total) by mouth 2 (two) times daily for 2 days. 04/07/18 04/09/18  Philip Aspen, Limmie Patricia, MD  rosuvastatin (CRESTOR) 10 MG tablet Take 10 mg by mouth daily.    [provider]  sertraline (ZOLOFT) 100 MG  tablet Take 100 mg by mouth daily.    [provider]    Physical Exam: Vitals:   12/21/19 1140 12/21/19 1146  BP: 125/82   Pulse: (!) 103   Resp: 20   Temp: 97.8 F (36.6 C)   TempSrc: Oral   SpO2: 99%   Weight:  49.9 kg  Height:  5\' 2"  (1.575 m)    Constitutional: NAD, calm, comfortable, pleasantly confused Vitals:   12/21/19 1140 12/21/19 1146  BP: 125/82   Pulse: (!) 103   Resp: 20   Temp: 97.8 F (36.6 C)   TempSrc: Oral   SpO2: 99%   Weight:  49.9 kg  Height:  5\' 2"  (1.575 m)   Eyes: lids and conjunctivae normal ENMT: Mucous membranes are moist.  Neck: normal, supple Respiratory: clear to auscultation bilaterally. Normal respiratory effort. No accessory muscle use.  Cardiovascular: Regular rate and rhythm, no murmurs. No extremity edema. Abdomen: no tenderness, no distention. Bowel sounds positive.  Musculoskeletal:  No joint deformity upper and lower extremities.  Left hip tender to palpation with discomfort on range of motion. Skin: no rashes, lesions, ulcers.  Psychiatric: Pleasantly confused  Labs on Admission: I have personally reviewed following labs and imaging studies  CBC: Recent Labs  Lab 12/21/19 1246  WBC 6.8  NEUTROABS 4.8  HGB 13.2  HCT 41.9  MCV 94.4  PLT 251   Basic Metabolic Panel: Recent Labs  Lab 12/21/19 1246  NA 137  K 4.0  CL 107  CO2 18*  GLUCOSE 90  BUN 44*  CREATININE 2.02*  CALCIUM 9.1   GFR: Estimated Creatinine Clearance: 15.7 mL/min (A) (by C-G formula based on SCr of 2.02 mg/dL (H)). Liver Function Tests: Recent Labs  Lab 12/21/19 1246  AST 21  ALT 18  ALKPHOS 148*  BILITOT 2.0*  PROT 6.6  ALBUMIN 3.0*   No results for input(s): LIPASE, AMYLASE in the last 168 hours. No results for input(s): AMMONIA in the last 168 hours. Coagulation Profile: No results for input(s): INR, PROTIME in the last 168 hours. Cardiac Enzymes: No results for input(s): CKTOTAL, CKMB, CKMBINDEX, TROPONINI in the  last 168 hours. BNP (last 3 results) No results for input(s): PROBNP in the last 8760 hours. HbA1C: No results for input(s): HGBA1C in the last 72 hours. CBG: No results for input(s): GLUCAP in the last 168 hours. Lipid Profile: No results for input(s): CHOL, HDL, LDLCALC, TRIG, CHOLHDL, LDLDIRECT in the last 72 hours. Thyroid Function Tests: No results for input(s): TSH, T4TOTAL, FREET4, T3FREE, THYROIDAB in the last 72 hours. Anemia Panel: No results for input(s): VITAMINB12, FOLATE, FERRITIN, TIBC, IRON, RETICCTPCT in the  last 72 hours. Urine analysis:    Component Value Date/Time   COLORURINE AMBER (A) 04/06/2018 1227   APPEARANCEUR CLOUDY (A) 04/06/2018 1227   LABSPEC 1.017 04/06/2018 1227   PHURINE 5.0 04/06/2018 1227   GLUCOSEU NEGATIVE 04/06/2018 1227   HGBUR NEGATIVE 04/06/2018 1227   BILIRUBINUR NEGATIVE 04/06/2018 1227   KETONESUR 5 (A) 04/06/2018 1227   PROTEINUR 30 (A) 04/06/2018 1227   NITRITE NEGATIVE 04/06/2018 1227   LEUKOCYTESUR TRACE (A) 04/06/2018 1227    Radiological Exams on Admission: DG Chest 1 View  Result Date: 12/21/2019 CLINICAL DATA:  Post fall. Left hip and knee pain after unwitnessed fall. EXAM: CHEST  1 VIEW COMPARISON:  Radiograph 04/06/2018 FINDINGS: The cardiomediastinal contours are unchanged. Mild aortic atherosclerosis and tortuosity. Subsegmental atelectasis or scarring at the left lung base. Pulmonary vasculature is normal. No consolidation, pleural effusion, or pneumothorax. No acute osseous abnormalities are seen. Bones are diffusely under mineralized. IMPRESSION: No acute abnormality. Aortic Atherosclerosis (ICD10-I70.0). Electronically Signed   By: Narda Rutherford M.D.   On: 12/21/2019 12:36   DG Knee Complete 4 Views Left  Result Date: 12/21/2019 CLINICAL DATA:  Left knee pain after unwitnessed fall EXAM: LEFT KNEE - COMPLETE 4+ VIEW COMPARISON:  None. FINDINGS: Osseous demineralization. No evidence of fracture, dislocation, or joint  effusion. Mild tricompartmental joint space narrowing. Chondrocalcinosis. Scattered vascular calcifications. Soft tissues within normal limits. IMPRESSION: Negative. Electronically Signed   By: Duanne Guess D.O.   On: 12/21/2019 12:37   DG Hip Unilat W or Wo Pelvis 2-3 Views Left  Result Date: 12/21/2019 CLINICAL DATA:  Fall, left hip pain EXAM: DG HIP (WITH OR WITHOUT PELVIS) 2-3V LEFT COMPARISON:  None. FINDINGS: There is a left femoral neck fracture with varus angulation. No subluxation or dislocation. SI joints symmetric and unremarkable. Vascular calcifications noted. Rounded calcification in the pelvis, presumably fibroid. IMPRESSION: Left femoral neck fracture with varus angulation. Electronically Signed   By: Charlett Nose M.D.   On: 12/21/2019 12:33    Assessment/Plan Active Problems:   Hip fracture (HCC)    Left femoral neck fracture status post fall -Pain management with fentanyl pushes as needed -Further evaluation and management per orthopedics, who is aware of patient.  Please call on arrival. -Continue n.p.o. for now as ORIF is anticipated today -Fall precautions  AKI on CKD stage IIIb -Likely prerenal with baseline creatinine 1.3-1.6 -IV fluid with LR -Start on oral sodium bicarbonate and monitor metabolic acidosis -Check urine electrolytes for now -Avoid nephrotoxic agents -Recheck a.m. labs  Hypothyroidism -Continue Synthroid  Dementia -Continue Aricept, Zoloft, and Ativan as needed for anxiety  Dyslipidemia -Hold Crestor for now and check CK levels   DVT prophylaxis: Heparin Code Status: Full Family Communication: Discussed with daughter Chayah Mckee Disposition Plan: Transfer to [facility] for orthopedic evaluation and management Consults called: EDP had spoken with Dr. August Saucer with orthopedics Admission status: Inpatient, MedSurg   Marzelle Rutten D Sherryll Burger DO [facility]  If 7PM-7AM, please contact night-coverage [email or web address]  12/21/2019, 2:07 PM

## 2019-12-21 NOTE — ED Provider Notes (Signed)
[facility] EMERGENCY DEPARTMENT Provider Note   CSN: [id number] Arrival date & time: 12/21/19  1050     History Chief Complaint  Patient presents with  . Hip Pain    Robin Grant is a 86 y.o. female.  HPI   This patient is an 86 year old female, she does have dementia, level 5 caveat applies.  The patient presents after having multiple falls this week.  Originally it was on Wednesday, 5 days ago and she has not been able to walk right since that time and continues to fall.  She complains of pain in the left hip and knee.  I did obtain additional history from the family members by phone who report that she has been complaining of persistent pain for 5 days but seems to be getting worse.  There is no signs of head injury, no complaints of coughing shortness of breath chest pain vomiting or diarrhea.  Level 5 caveat applies secondary to dementia.  The  Past Medical History:  Diagnosis Date  . Anxiety   . Chronic constipation   . Dementia (HCC)   . HTN (hypertension)   . Hyperlipidemia   . Hypothyroidism   . Insomnia     Patient Active Problem List   Diagnosis Date Noted  . Heat stroke and sunstroke 04/06/2018  . Acute lower UTI 04/06/2018  . Dehydration 12/03/2017  . Hypothyroidism 12/03/2017  . Acidosis 12/03/2017  . Acute renal failure superimposed on stage 3 chronic kidney disease (HCC) 12/02/2017  . Left sided chest pain 07/19/2017  . Mixed hyperlipidemia 07/19/2017  . Rectal bleeding 07/26/2013  . Diverticulosis 07/26/2013  . Benign hypertension 07/26/2013  . Chronic kidney disease, stage III (moderate) 07/26/2013  . Left sided abdominal pain 05/03/2011  . Laryngopharyngeal reflux (LPR) 05/03/2011  . Esophageal dysphagia 05/03/2011  . CONSTIPATION 02/15/2009  . Hyperlipidemia 02/14/2009  . Essential hypertension 02/14/2009    Past Surgical History:  Procedure Laterality Date  . APPENDECTOMY    . COLONOSCOPY  2007   Dr. [name], pancolonic diverticula.  .  COLONOSCOPY N/A 07/28/2013   Procedure: COLONOSCOPY;  Surgeon: West Bali, MD;  [facility];  Service: Endoscopy;  Laterality: N/A;  PLEASE SCHEDULE AT 3 PM PER DR. FIELDS   . ESOPHAGOGASTRODUODENOSCOPY  05/21/2011   Dr. [name]: normal tubular esophagus, s/p empiric dilation with 20 F, innocent appearing AVM, small hiatal hernia  . GIVENS CAPSULE STUDY N/A 07/30/2013   Procedure: GIVENS CAPSULE STUDY;  Surgeon: [name], MD;  Location: [facility];  Service: Endoscopy;  Laterality: N/A;  . PORT-A-CATH REMOVAL  2008  . right ankle surgery  2007   osteomyelitis  . TUBAL LIGATION       OB History   No obstetric history on file.     Family History  Problem Relation Age of Onset  . Breast cancer Mother        deceased from MI  . Stroke Father        MI too  . Colon cancer Maternal Aunt   . Rheumatic fever Sister 52       open heart surgery age 75, deceased age 4  . Lung cancer Sister   . Heart attack Sister   . Kidney disease Sister        born with one kidney, required transplant    Social History   Tobacco Use  . Smoking status: Former Games developer  . Smokeless tobacco: Never Used  Substance Use Topics  . Alcohol  use: No  . Drug use: No    Home Medications Prior to Admission medications   Medication Sig Start Date End Date Taking? Authorizing Provider  donepezil (ARICEPT) 5 MG tablet Take 5 mg by mouth daily.    [provider]  levothyroxine (SYNTHROID, LEVOTHROID) 88 MCG tablet Take 88 mcg by mouth daily. 05/28/17   [provider]  LORazepam (ATIVAN) 0.5 MG tablet Take 0.5-1 tablets by mouth 2 (two) times daily as needed for anxiety or sleep.  04/18/17   [provider]  Melatonin 5 MG TABS Take 1 tablet by mouth daily.    [provider]  Multiple Vitamin (MULTIVITAMIN) capsule Take 1 capsule by mouth daily.      [provider]  ondansetron (ZOFRAN) 4 MG tablet Take 4 mg by mouth every 8 (eight) hours as  needed for nausea or vomiting.    [provider]  potassium chloride SA (K-DUR,KLOR-CON) 20 MEQ tablet Take 1 tablet (20 mEq total) by mouth 2 (two) times daily for 2 days. 04/07/18 04/09/18  Isaac Bliss, Rayford Halsted, MD  rosuvastatin (CRESTOR) 10 MG tablet Take 10 mg by mouth daily.    [provider]  sertraline (ZOLOFT) 100 MG tablet Take 100 mg by mouth daily.    [provider]    Allergies    Hydrocodone and Codeine  Review of Systems   Review of Systems  Unable to perform ROS: Dementia    Physical Exam Updated Vital Signs BP 125/82 (BP Location: Left Arm)   Pulse (!) 103   Temp 97.8 F (36.6 C) (Oral)   Resp 20   Ht 1.575 m (5\' 2" )   Wt 49.9 kg   SpO2 99%   BMI 20.12 kg/m   Physical Exam Vitals and nursing note reviewed.  Constitutional:      General: She is not in acute distress.    Appearance: She is well-developed.  HENT:     Head: Normocephalic and atraumatic.     Mouth/Throat:     Pharynx: No oropharyngeal exudate.  Eyes:     General: No scleral icterus.       Right eye: No discharge.        Left eye: No discharge.     Conjunctiva/sclera: Conjunctivae normal.     Pupils: Pupils are equal, round, and reactive to light.  Neck:     Thyroid: No thyromegaly.     Vascular: No JVD.  Cardiovascular:     Rate and Rhythm: Regular rhythm. Tachycardia present.     Heart sounds: Normal heart sounds. No murmur. No friction rub. No gallop.   Pulmonary:     Effort: Pulmonary effort is normal. No respiratory distress.     Breath sounds: Normal breath sounds. No wheezing or rales.  Abdominal:     General: Bowel sounds are normal. There is no distension.     Palpations: Abdomen is soft. There is no mass.     Tenderness: There is no abdominal tenderness.  Musculoskeletal:        General: No tenderness. Normal range of motion.     Cervical back: Normal range of motion and neck supple.     Comments: The left leg is shortened and externally  rotated.  There is no pain with range of motion of the knee.  There is pain with range of motion of the left hip to both flexion extension and internal and external rotation.  All other extremities appear normal and atraumatic except for  some skin tears of the bilateral upper extremities  Lymphadenopathy:     Cervical: No cervical adenopathy.  Skin:    General: Skin is warm and dry.     Findings: No erythema or rash.  Neurological:     Mental Status: She is alert.     Coordination: Coordination normal.  Psychiatric:        Behavior: Behavior normal.     ED Results / Procedures / Treatments   Labs (all labs ordered are listed, but only abnormal results are displayed) Labs Reviewed  CBC WITH DIFFERENTIAL/PLATELET  COMPREHENSIVE METABOLIC PANEL  TYPE AND SCREEN    EKG None  Radiology DG Chest 1 View  Result Date: 12/21/2019 CLINICAL DATA:  Post fall. Left hip and knee pain after unwitnessed fall. EXAM: CHEST  1 VIEW COMPARISON:  Radiograph 04/06/2018 FINDINGS: The cardiomediastinal contours are unchanged. Mild aortic atherosclerosis and tortuosity. Subsegmental atelectasis or scarring at the left lung base. Pulmonary vasculature is normal. No consolidation, pleural effusion, or pneumothorax. No acute osseous abnormalities are seen. Bones are diffusely under mineralized. IMPRESSION: No acute abnormality. Aortic Atherosclerosis (ICD10-I70.0). Electronically Signed   By: Narda Rutherford M.D.   On: 12/21/2019 12:36   DG Knee Complete 4 Views Left  Result Date: 12/21/2019 CLINICAL DATA:  Left knee pain after unwitnessed fall EXAM: LEFT KNEE - COMPLETE 4+ VIEW COMPARISON:  None. FINDINGS: Osseous demineralization. No evidence of fracture, dislocation, or joint effusion. Mild tricompartmental joint space narrowing. Chondrocalcinosis. Scattered vascular calcifications. Soft tissues within normal limits. IMPRESSION: Negative. Electronically Signed   By: Duanne Guess D.O.   On: 12/21/2019  12:37   DG Hip Unilat W or Wo Pelvis 2-3 Views Left  Result Date: 12/21/2019 CLINICAL DATA:  Fall, left hip pain EXAM: DG HIP (WITH OR WITHOUT PELVIS) 2-3V LEFT COMPARISON:  None. FINDINGS: There is a left femoral neck fracture with varus angulation. No subluxation or dislocation. SI joints symmetric and unremarkable. Vascular calcifications noted. Rounded calcification in the pelvis, presumably fibroid. IMPRESSION: Left femoral neck fracture with varus angulation. Electronically Signed   By: Charlett Nose M.D.   On: 12/21/2019 12:33    Procedures Procedures (including critical care time)  Medications Ordered in ED Medications  fentaNYL (SUBLIMAZE) injection 50 mcg (has no administration in time range)    ED Course  I have reviewed the triage vital signs and the nursing notes.  Pertinent labs & imaging results that were available during my care of the patient were reviewed by me and considered in my medical decision making (see chart for details).    MDM Rules/Calculators/A&P                      Confirmed with family that the patient is not on any blood thinners, she is pleasantly demented and does not appear to be in severe distress, vital signs reflect only a mild tachycardia.  I have personally viewed and interpreted the x-ray which shows a left femoral neck fracture.  I do not see any significant traumatic injury to the knee though there does appear to be some arthritis, chest x-ray without acute infiltrates, preop EKG and labs pending, pain medication given, will consult with orthopedics.  CT scan does not fact confirm an essentially nondisplaced fracture intertrochanteric, discussed with orthopedics, hospitalist will admit and transfer for orthopedic care  Final Clinical Impression(s) / ED Diagnoses Final diagnoses:  Closed fracture of left hip, initial encounter [facility])    Rx / DC Orders ED Discharge  Orders    None       Eber Hong, MD 12/22/19 1526

## 2019-12-21 NOTE — Consult Note (Signed)
ORTHOPAEDIC CONSULTATION  REQUESTING PHYSICIAN: Erick Blinks, DO  Chief Complaint: "My left hip hurts"  HPI: Robin Grant is a 86 y.o. female who presents with left hip pain following fall about 5 days ago.  She has not been able to fully weightbear on the LLE since the fall.  She has a history of dementia and history is somewhat limited.  She does not complain of any other extremity pain.   Past Medical History:  Diagnosis Date  . Anxiety   . Chronic constipation   . Dementia (HCC)   . HTN (hypertension)   . Hyperlipidemia   . Hypothyroidism   . Insomnia    Past Surgical History:  Procedure Laterality Date  . APPENDECTOMY    . COLONOSCOPY  2007   Dr. [name], pancolonic diverticula.  . COLONOSCOPY N/A 07/28/2013   Procedure: COLONOSCOPY;  Surgeon: West Bali, MD;  Location[facility];  Service: Endoscopy;  Laterality: N/A;  PLEASE SCHEDULE AT 3 PM PER DR. FIELDS   . ESOPHAGOGASTRODUODENOSCOPY  05/21/2011   Dr. [name]: normal tubular esophagus, s/p empiric dilation with 51 F, innocent appearing AVM, small hiatal hernia  . GIVENS CAPSULE STUDY N/A 07/30/2013   Procedure: GIVENS CAPSULE STUDY;  Surgeon: [name], MD;  Location: [facility];  Service: Endoscopy;  Laterality: N/A;  . PORT-A-CATH REMOVAL  2008  . right ankle surgery  2007   osteomyelitis  . TUBAL LIGATION     Social History   Socioeconomic History  . Marital status: Widowed    Spouse name: Not on file  . Number of children: 2  . Years of education: Not on file  . Highest education level: Not on file  Occupational History  . Occupation:  retired    Associate Professor: RETIRED    Comment: [employer]  Tobacco Use  . Smoking status: Former Games developer  . Smokeless tobacco: Never Used  Substance and Sexual Activity  . Alcohol use: No  . Drug use: No  . Sexual activity: Never  Other Topics Concern  . Not on file  Social History Narrative  . Not on file   Social Determinants of Health    Financial Resource Strain:   . Difficulty of Paying Living Expenses:   Food Insecurity:   . Worried About Programme researcher, broadcasting/film/video in the Last Year:   . Barista in the Last Year:   Transportation Needs:   . Freight forwarder (Medical):   Marland Kitchen Lack of Transportation (Non-Medical):   Physical Activity:   . Days of Exercise per Week:   . Minutes of Exercise per Session:   Stress:   . Feeling of Stress :   Social Connections:   . Frequency of Communication with Friends and Family:   . Frequency of Social Gatherings with Friends and Family:   . Attends Religious Services:   . Active Member of Clubs or Organizations:   . Attends Banker Meetings:   Marland Kitchen Marital Status:    Family History  Problem Relation Age of Onset  . Breast cancer Mother        deceased from MI  . Stroke Father        MI too  . Colon cancer Maternal Aunt   . Rheumatic fever Sister 20       open heart surgery age 95, deceased age 79  . Lung cancer Sister   . Heart attack Sister   . Kidney disease Sister  born with one kidney, required transplant   - negative except otherwise stated in the family history section Allergies  Allergen Reactions  . Hydrocodone Nausea And Vomiting  . Codeine Nausea And Vomiting   Prior to Admission medications   Medication Sig Start Date End Date Taking? Authorizing Provider  donepezil (ARICEPT) 5 MG tablet Take 5 mg by mouth daily.   Yes [provider]  levothyroxine (SYNTHROID, LEVOTHROID) 88 MCG tablet Take 88 mcg by mouth daily. 05/28/17  Yes [provider]  LORazepam (ATIVAN) 0.5 MG tablet Take 0.5-1 tablets by mouth 2 (two) times daily as needed for anxiety or sleep.  04/18/17  Yes [provider]  Melatonin 5 MG TABS Take 1 tablet by mouth daily.   Yes [provider]  Multiple Vitamin (MULTIVITAMIN) capsule Take 1 capsule by mouth daily.     Yes [provider]  ondansetron (ZOFRAN) 4 MG tablet Take 4  mg by mouth every 8 (eight) hours as needed for nausea or vomiting.   Yes [provider]  sertraline (ZOLOFT) 100 MG tablet Take 100 mg by mouth daily.   Yes [provider]   DG Chest 1 View  Result Date: 12/21/2019 CLINICAL DATA:  Post fall. Left hip and knee pain after unwitnessed fall. EXAM: CHEST  1 VIEW COMPARISON:  Radiograph 04/06/2018 FINDINGS: The cardiomediastinal contours are unchanged. Mild aortic atherosclerosis and tortuosity. Subsegmental atelectasis or scarring at the left lung base. Pulmonary vasculature is normal. No consolidation, pleural effusion, or pneumothorax. No acute osseous abnormalities are seen. Bones are diffusely under mineralized. IMPRESSION: No acute abnormality. Aortic Atherosclerosis (ICD10-I70.0). Electronically Signed   By: Keith Rake M.D.   On: 12/21/2019 12:36   DG Knee Complete 4 Views Left  Result Date: 12/21/2019 CLINICAL DATA:  Left knee pain after unwitnessed fall EXAM: LEFT KNEE - COMPLETE 4+ VIEW COMPARISON:  None. FINDINGS: Osseous demineralization. No evidence of fracture, dislocation, or joint effusion. Mild tricompartmental joint space narrowing. Chondrocalcinosis. Scattered vascular calcifications. Soft tissues within normal limits. IMPRESSION: Negative. Electronically Signed   By: Davina Poke D.O.   On: 12/21/2019 12:37   DG Hip Unilat W or Wo Pelvis 2-3 Views Left  Result Date: 12/21/2019 CLINICAL DATA:  Fall, left hip pain EXAM: DG HIP (WITH OR WITHOUT PELVIS) 2-3V LEFT COMPARISON:  None. FINDINGS: There is a left femoral neck fracture with varus angulation. No subluxation or dislocation. SI joints symmetric and unremarkable. Vascular calcifications noted. Rounded calcification in the pelvis, presumably fibroid. IMPRESSION: Left femoral neck fracture with varus angulation. Electronically Signed   By: Rolm Baptise M.D.   On: 12/21/2019 12:33   - pertinent xrays, CT, MRI studies were reviewed and independently  interpreted  Positive ROS: All other systems have been reviewed and were otherwise negative with the exception of those mentioned in the HPI and as above.  Physical Exam: General: Alert, no acute distress Psychiatric: Patient is competent for consent with normal mood and affect Lymphatic: No axillary or cervical lymphadenopathy Cardiovascular: No pedal edema Respiratory: No cyanosis, no use of accessory musculature GI: No organomegaly, abdomen is soft and non-tender    Images:  @ENCIMAGES @  Labs:  Lab Results  Component Value Date   HGBA1C 5.2 07/19/2017   REPTSTATUS 04/11/2018 FINAL 04/06/2018   CULT  04/06/2018    NO GROWTH 5 DAYS Performed at [facility], [address], [place], [place] [address]    LABORGA ESCHERICHIA COLI (A) 04/06/2018    Lab Results  Component Value  Date   ALBUMIN 3.0 (L) 12/21/2019   ALBUMIN 3.8 04/06/2018   ALBUMIN 2.8 (L) 12/05/2017    Neurologic: Patient does not have protective sensation bilateral lower extremities.   MUSCULOSKELETAL:   Shortened and externally rotated LLE.  No significant pain with logroll of bilateral lower extremities.  Negative for knee effusion bilaterally. No pain with passive ROM of bilateral ankles, knees, wrists, elbows, shoulders.  1+ DP pulse of LLE.  Dorsiflexion/plantar flexion intact to LLE.    Assessment: Left hip fracture  Plan: Patient admitted to hospitalist service.  Plan for left hip hemiarthroplasty vs total hip arthroplasty tomorrow afternoon.  Procedure was discussed with patient and her questions were answered to her satisfaction. Plan aspirin BID with SCD for DVT prophylaxis.    Thank you for the consult and the opportunity to see Ms. Wilma Flavin [place], [place] [facility] [phone] 10:35 PM

## 2019-12-22 ENCOUNTER — Inpatient Hospital Stay (HOSPITAL_COMMUNITY): Payer: Medicare HMO

## 2019-12-22 ENCOUNTER — Encounter (HOSPITAL_COMMUNITY)
Admission: EM | Disposition: A | Discharge status: Home Health | Payer: Self-pay | Source: Home / Self Care | Attending: Family Medicine

## 2019-12-22 ENCOUNTER — Inpatient Hospital Stay (HOSPITAL_COMMUNITY): Payer: Medicare HMO | Admitting: Anesthesiology

## 2019-12-22 ENCOUNTER — Encounter (HOSPITAL_COMMUNITY): Payer: Self-pay | Admitting: Internal Medicine

## 2019-12-22 DIAGNOSIS — M12552 Traumatic arthropathy, left hip: Secondary | ICD-10-CM

## 2019-12-22 HISTORY — PX: TOTAL HIP ARTHROPLASTY: SHX124

## 2019-12-22 HISTORY — PX: HIP ARTHROPLASTY: SHX981

## 2019-12-22 LAB — SURGICAL PCR SCREEN
MRSA, PCR: NEGATIVE
Staphylococcus aureus: POSITIVE — AB

## 2019-12-22 LAB — CBC WITH DIFFERENTIAL/PLATELET
Abs Immature Granulocytes: 0.49 10*3/uL — ABNORMAL HIGH (ref 0.00–0.07)
Basophils Absolute: 0.1 10*3/uL (ref 0.0–0.1)
Basophils Relative: 1 %
Eosinophils Absolute: 0.2 10*3/uL (ref 0.0–0.5)
Eosinophils Relative: 2 %
HCT: 34 % — ABNORMAL LOW (ref 36.0–46.0)
Hemoglobin: 11.1 g/dL — ABNORMAL LOW (ref 12.0–15.0)
Immature Granulocytes: 7 %
Lymphocytes Relative: 13 %
Lymphs Abs: 0.9 10*3/uL (ref 0.7–4.0)
MCH: 30 pg (ref 26.0–34.0)
MCHC: 32.6 g/dL (ref 30.0–36.0)
MCV: 91.9 fL (ref 80.0–100.0)
Monocytes Absolute: 0.5 10*3/uL (ref 0.1–1.0)
Monocytes Relative: 8 %
Neutro Abs: 4.9 10*3/uL (ref 1.7–7.7)
Neutrophils Relative %: 69 %
Platelets: 247 10*3/uL (ref 150–400)
RBC: 3.7 MIL/uL — ABNORMAL LOW (ref 3.87–5.11)
RDW: 14.5 % (ref 11.5–15.5)
WBC: 7 10*3/uL (ref 4.0–10.5)
nRBC: 0 % (ref 0.0–0.2)

## 2019-12-22 LAB — COMPREHENSIVE METABOLIC PANEL
ALT: 19 U/L (ref 0–44)
AST: 24 U/L (ref 15–41)
Albumin: 2.3 g/dL — ABNORMAL LOW (ref 3.5–5.0)
Alkaline Phosphatase: 120 U/L (ref 38–126)
Anion gap: 12 (ref 5–15)
BUN: 38 mg/dL — ABNORMAL HIGH (ref 8–23)
CO2: 18 mmol/L — ABNORMAL LOW (ref 22–32)
Calcium: 8.7 mg/dL — ABNORMAL LOW (ref 8.9–10.3)
Chloride: 108 mmol/L (ref 98–111)
Creatinine, Ser: 2.03 mg/dL — ABNORMAL HIGH (ref 0.44–1.00)
GFR calc Af Amer: 25 mL/min — ABNORMAL LOW (ref >60)
GFR calc non Af Amer: 22 mL/min — ABNORMAL LOW (ref >60)
Glucose, Bld: 84 mg/dL (ref 70–99)
Potassium: 3.7 mmol/L (ref 3.5–5.1)
Sodium: 138 mmol/L (ref 135–145)
Total Bilirubin: 1.6 mg/dL — ABNORMAL HIGH (ref 0.3–1.2)
Total Protein: 5.3 g/dL — ABNORMAL LOW (ref 6.5–8.1)

## 2019-12-22 LAB — TYPE AND SCREEN
ABO/RH(D): O POS
Antibody Screen: NEGATIVE

## 2019-12-22 LAB — ABO/RH: ABO/RH(D): O POS

## 2019-12-22 SURGERY — HEMIARTHROPLASTY, HIP, DIRECT ANTERIOR APPROACH, FOR FRACTURE
Anesthesia: General | Site: Hip | Laterality: Left

## 2019-12-22 MED ORDER — ONDANSETRON HCL 4 MG/2ML IJ SOLN
4.0000 mg | Freq: Four times a day (QID) | INTRAMUSCULAR | Status: DC | PRN
  Administered 2019-12-22 – 2019-12-23 (×2): 4 mg via INTRAVENOUS
  Filled 2019-12-22 (×2): qty 2

## 2019-12-22 MED ORDER — PHENYLEPHRINE 40 MCG/ML (10ML) SYRINGE FOR IV PUSH (FOR BLOOD PRESSURE SUPPORT)
PREFILLED_SYRINGE | INTRAVENOUS | Status: AC
  Filled 2019-12-22: qty 20

## 2019-12-22 MED ORDER — FENTANYL CITRATE (PF) 100 MCG/2ML IJ SOLN
INTRAMUSCULAR | Status: AC
  Filled 2019-12-22: qty 2

## 2019-12-22 MED ORDER — MORPHINE SULFATE (PF) 2 MG/ML IV SOLN: 0.5000 mg | INTRAVENOUS | Status: DC | PRN

## 2019-12-22 MED ORDER — PROMETHAZINE HCL 25 MG/ML IJ SOLN
INTRAMUSCULAR | Status: AC
  Filled 2019-12-22: qty 1

## 2019-12-22 MED ORDER — ONDANSETRON HCL 4 MG/2ML IJ SOLN
INTRAMUSCULAR | Status: DC | PRN
  Administered 2019-12-22: 4 mg via INTRAVENOUS

## 2019-12-22 MED ORDER — ONDANSETRON HCL 4 MG/2ML IJ SOLN
INTRAMUSCULAR | Status: AC
  Filled 2019-12-22: qty 2

## 2019-12-22 MED ORDER — METHOCARBAMOL 1000 MG/10ML IJ SOLN
500.0000 mg | Freq: Four times a day (QID) | INTRAVENOUS | Status: DC | PRN
  Filled 2019-12-22: qty 5

## 2019-12-22 MED ORDER — CHLORHEXIDINE GLUCONATE CLOTH 2 % EX PADS
6.0000 | MEDICATED_PAD | Freq: Every day | CUTANEOUS | Status: DC
  Administered 2019-12-23 – 2019-12-24 (×2): 6 via TOPICAL

## 2019-12-22 MED ORDER — FENTANYL CITRATE (PF) 100 MCG/2ML IJ SOLN
25.0000 ug | INTRAMUSCULAR | Status: DC | PRN
  Administered 2019-12-22: 25 ug via INTRAVENOUS

## 2019-12-22 MED ORDER — PHENOL 1.4 % MT LIQD: 1.0000 | OROMUCOSAL | Status: DC | PRN

## 2019-12-22 MED ORDER — HYDROCODONE-ACETAMINOPHEN 7.5-325 MG PO TABS: 1.0000 | ORAL_TABLET | ORAL | Status: DC | PRN

## 2019-12-22 MED ORDER — METHOCARBAMOL 500 MG PO TABS: 500.0000 mg | ORAL_TABLET | Freq: Four times a day (QID) | ORAL | Status: DC | PRN

## 2019-12-22 MED ORDER — SUCCINYLCHOLINE CHLORIDE 200 MG/10ML IV SOSY
PREFILLED_SYRINGE | INTRAVENOUS | Status: AC
  Filled 2019-12-22: qty 20

## 2019-12-22 MED ORDER — METOCLOPRAMIDE HCL 5 MG PO TABS: 5.0000 mg | ORAL_TABLET | Freq: Three times a day (TID) | ORAL | Status: DC | PRN

## 2019-12-22 MED ORDER — ASPIRIN 81 MG PO CHEW
81.0000 mg | CHEWABLE_TABLET | Freq: Two times a day (BID) | ORAL | Status: DC
  Administered 2019-12-22 – 2019-12-24 (×4): 81 mg via ORAL
  Filled 2019-12-22 (×4): qty 1

## 2019-12-22 MED ORDER — MENTHOL 3 MG MT LOZG: 1.0000 | LOZENGE | OROMUCOSAL | Status: DC | PRN

## 2019-12-22 MED ORDER — SODIUM CHLORIDE 0.9 % IR SOLN
Status: DC | PRN
  Administered 2019-12-22: 3000 mL

## 2019-12-22 MED ORDER — HYDROCODONE-ACETAMINOPHEN 5-325 MG PO TABS
1.0000 | ORAL_TABLET | ORAL | Status: DC | PRN
  Administered 2019-12-22 – 2019-12-23 (×2): 2 via ORAL
  Filled 2019-12-22 (×2): qty 2

## 2019-12-22 MED ORDER — PROMETHAZINE HCL 25 MG/ML IJ SOLN
6.2500 mg | INTRAMUSCULAR | Status: DC | PRN
  Administered 2019-12-22: 6.25 mg via INTRAVENOUS

## 2019-12-22 MED ORDER — 0.9 % SODIUM CHLORIDE (POUR BTL) OPTIME
TOPICAL | Status: DC | PRN
  Administered 2019-12-22: 17:00:00 1000 mL

## 2019-12-22 MED ORDER — PROPOFOL 10 MG/ML IV BOLUS
INTRAVENOUS | Status: DC | PRN
  Administered 2019-12-22: 60 mg via INTRAVENOUS
  Administered 2019-12-22: 20 mg via INTRAVENOUS

## 2019-12-22 MED ORDER — MUPIROCIN 2 % EX OINT
1.0000 "application " | TOPICAL_OINTMENT | Freq: Two times a day (BID) | CUTANEOUS | Status: DC
  Administered 2019-12-22 – 2019-12-24 (×4): 1 via NASAL
  Filled 2019-12-22: qty 22

## 2019-12-22 MED ORDER — DIPHENHYDRAMINE HCL 12.5 MG/5ML PO ELIX: 12.5000 mg | ORAL_SOLUTION | ORAL | Status: DC | PRN

## 2019-12-22 MED ORDER — ROCURONIUM BROMIDE 10 MG/ML (PF) SYRINGE
PREFILLED_SYRINGE | INTRAVENOUS | Status: AC
  Filled 2019-12-22: qty 20

## 2019-12-22 MED ORDER — SUGAMMADEX SODIUM 200 MG/2ML IV SOLN
INTRAVENOUS | Status: DC | PRN
  Administered 2019-12-22: 200 mg via INTRAVENOUS

## 2019-12-22 MED ORDER — ESMOLOL HCL 100 MG/10ML IV SOLN
INTRAVENOUS | Status: AC
  Filled 2019-12-22: qty 10

## 2019-12-22 MED ORDER — SODIUM CHLORIDE 0.9 % IV SOLN: INTRAVENOUS | Status: DC

## 2019-12-22 MED ORDER — DOCUSATE SODIUM 100 MG PO CAPS
100.0000 mg | ORAL_CAPSULE | Freq: Two times a day (BID) | ORAL | Status: DC
  Administered 2019-12-22 – 2019-12-24 (×4): 100 mg via ORAL
  Filled 2019-12-22 (×4): qty 1

## 2019-12-22 MED ORDER — FENTANYL CITRATE (PF) 250 MCG/5ML IJ SOLN
INTRAMUSCULAR | Status: AC
  Filled 2019-12-22: qty 5

## 2019-12-22 MED ORDER — CEFAZOLIN SODIUM-DEXTROSE 1-4 GM/50ML-% IV SOLN
1.0000 g | Freq: Four times a day (QID) | INTRAVENOUS | Status: AC
  Administered 2019-12-22 – 2019-12-23 (×2): 1 g via INTRAVENOUS
  Filled 2019-12-22 (×2): qty 50

## 2019-12-22 MED ORDER — ESMOLOL HCL 100 MG/10ML IV SOLN
INTRAVENOUS | Status: DC | PRN
  Administered 2019-12-22: 20 mg via INTRAVENOUS

## 2019-12-22 MED ORDER — LIDOCAINE 2% (20 MG/ML) 5 ML SYRINGE
INTRAMUSCULAR | Status: DC | PRN
  Administered 2019-12-22: 50 mg via INTRAVENOUS

## 2019-12-22 MED ORDER — ROCURONIUM BROMIDE 10 MG/ML (PF) SYRINGE
PREFILLED_SYRINGE | INTRAVENOUS | Status: DC | PRN
  Administered 2019-12-22: 30 mg via INTRAVENOUS

## 2019-12-22 MED ORDER — AMLODIPINE BESYLATE 5 MG PO TABS
5.0000 mg | ORAL_TABLET | Freq: Every day | ORAL | Status: DC
  Administered 2019-12-22 – 2019-12-24 (×3): 5 mg via ORAL
  Filled 2019-12-22 (×3): qty 1

## 2019-12-22 MED ORDER — METOCLOPRAMIDE HCL 5 MG/ML IJ SOLN: 5.0000 mg | Freq: Three times a day (TID) | INTRAMUSCULAR | Status: DC | PRN

## 2019-12-22 MED ORDER — EPHEDRINE 5 MG/ML INJ
INTRAVENOUS | Status: AC
  Filled 2019-12-22: qty 10

## 2019-12-22 MED ORDER — LACTATED RINGERS IV SOLN: INTRAVENOUS | Status: DC

## 2019-12-22 MED ORDER — ACETAMINOPHEN 500 MG PO TABS: 1000.0000 mg | ORAL_TABLET | Freq: Once | ORAL | Status: DC

## 2019-12-22 MED ORDER — PHENYLEPHRINE 40 MCG/ML (10ML) SYRINGE FOR IV PUSH (FOR BLOOD PRESSURE SUPPORT)
PREFILLED_SYRINGE | INTRAVENOUS | Status: AC
  Filled 2019-12-22: qty 10

## 2019-12-22 MED ORDER — TRAMADOL HCL 50 MG PO TABS: 50.0000 mg | ORAL_TABLET | Freq: Four times a day (QID) | ORAL | Status: DC | PRN

## 2019-12-22 MED ORDER — FENTANYL CITRATE (PF) 100 MCG/2ML IJ SOLN
INTRAMUSCULAR | Status: DC | PRN
  Administered 2019-12-22 (×3): 50 ug via INTRAVENOUS
  Administered 2019-12-22 (×2): 25 ug via INTRAVENOUS
  Administered 2019-12-22: 50 ug via INTRAVENOUS

## 2019-12-22 MED ORDER — DEXAMETHASONE SODIUM PHOSPHATE 10 MG/ML IJ SOLN
INTRAMUSCULAR | Status: AC
  Filled 2019-12-22: qty 1

## 2019-12-22 MED ORDER — ACETAMINOPHEN 325 MG PO TABS
325.0000 mg | ORAL_TABLET | Freq: Four times a day (QID) | ORAL | Status: DC | PRN
  Administered 2019-12-24: 650 mg via ORAL
  Filled 2019-12-22: qty 2

## 2019-12-22 MED ORDER — POLYETHYLENE GLYCOL 3350 17 G PO PACK: 17.0000 g | PACK | Freq: Every day | ORAL | Status: DC | PRN

## 2019-12-22 MED ORDER — ONDANSETRON HCL 4 MG PO TABS: 4.0000 mg | ORAL_TABLET | Freq: Four times a day (QID) | ORAL | Status: DC | PRN

## 2019-12-22 MED ORDER — LIDOCAINE 2% (20 MG/ML) 5 ML SYRINGE
INTRAMUSCULAR | Status: AC
  Filled 2019-12-22: qty 5

## 2019-12-22 MED ORDER — SUCCINYLCHOLINE CHLORIDE 200 MG/10ML IV SOSY
PREFILLED_SYRINGE | INTRAVENOUS | Status: DC | PRN
  Administered 2019-12-22: 40 mg via INTRAVENOUS

## 2019-12-22 MED ORDER — ARTIFICIAL TEARS OPHTHALMIC OINT
TOPICAL_OINTMENT | OPHTHALMIC | Status: AC
  Filled 2019-12-22: qty 3.5

## 2019-12-22 SURGICAL SUPPLY — 89 items
BLADE CLIPPER SURG (BLADE) IMPLANT
BLADE SAW RECIP 87.9 MT (BLADE) IMPLANT
BLADE SAW SAG 73X25 THK (BLADE)
BLADE SAW SGTL 18X1.27X75 (BLADE) ×1 IMPLANT
BLADE SAW SGTL 18X1.27X75MM (BLADE) ×1
BLADE SAW SGTL 73X25 THK (BLADE) IMPLANT
COVER BACK TABLE 24X17X13 BIG (DRAPES) IMPLANT
COVER WAND RF STERILE (DRAPES) ×1 IMPLANT
DRAPE C-ARM 42X72 X-RAY (DRAPES) ×2 IMPLANT
DRAPE HALF SHEET 40X57 (DRAPES) ×4 IMPLANT
DRAPE IMP U-DRAPE 54X76 (DRAPES) ×3 IMPLANT
DRAPE INCISE IOBAN 66X45 STRL (DRAPES) IMPLANT
DRAPE STERI IOBAN 125X83 (DRAPES) ×2 IMPLANT
DRAPE SURG 17X23 STRL (DRAPES) IMPLANT
DRAPE U-SHAPE 47X51 STRL (DRAPES) ×7 IMPLANT
DRESSING AQUACEL AG SP 3.5X10 (GAUZE/BANDAGES/DRESSINGS) IMPLANT
DRILL BIT 1/8DIAX5INL DISPOSE (BIT) ×3 IMPLANT
DRSG AQUACEL AG SP 3.5X10 (GAUZE/BANDAGES/DRESSINGS) ×3
DRSG PAD ABDOMINAL 8X10 ST (GAUZE/BANDAGES/DRESSINGS) ×1 IMPLANT
DRSG XEROFORM 1X8 (GAUZE/BANDAGES/DRESSINGS) ×2 IMPLANT
DURAPREP 26ML APPLICATOR (WOUND CARE) ×3 IMPLANT
ELECT BLADE 4.0 EZ CLEAN MEGAD (MISCELLANEOUS) ×3
ELECT BLADE 6.5 EXT (BLADE) IMPLANT
ELECT CAUTERY BLADE 6.4 (BLADE) ×3 IMPLANT
ELECT REM PT RETURN 9FT ADLT (ELECTROSURGICAL) ×3
ELECTRODE BLDE 4.0 EZ CLN MEGD (MISCELLANEOUS) IMPLANT
ELECTRODE REM PT RTRN 9FT ADLT (ELECTROSURGICAL) ×1 IMPLANT
EVACUATOR 1/8 PVC DRAIN (DRAIN) IMPLANT
FACESHIELD WRAPAROUND (MASK) ×3 IMPLANT
FACESHIELD WRAPAROUND OR TEAM (MASK) ×1 IMPLANT
GAUZE SPONGE 4X4 12PLY STRL (GAUZE/BANDAGES/DRESSINGS) ×3 IMPLANT
GAUZE XEROFORM 5X9 LF (GAUZE/BANDAGES/DRESSINGS) ×1 IMPLANT
GLOVE BIO SURGEON ST LM GN SZ9 (GLOVE) ×1 IMPLANT
GLOVE BIO SURGEON STRL SZ7.5 (GLOVE) ×2 IMPLANT
GLOVE BIOGEL PI IND STRL 7.0 (GLOVE) IMPLANT
GLOVE BIOGEL PI IND STRL 7.5 (GLOVE) IMPLANT
GLOVE BIOGEL PI IND STRL 8 (GLOVE) ×1 IMPLANT
GLOVE BIOGEL PI INDICATOR 7.0 (GLOVE) ×2
GLOVE BIOGEL PI INDICATOR 7.5 (GLOVE) ×6
GLOVE BIOGEL PI INDICATOR 8 (GLOVE) ×2
GLOVE ECLIPSE 8.0 STRL XLNG CF (GLOVE) ×3 IMPLANT
GOWN STRL REUS W/ TWL LRG LVL3 (GOWN DISPOSABLE) ×1 IMPLANT
GOWN STRL REUS W/ TWL XL LVL3 (GOWN DISPOSABLE) ×1 IMPLANT
GOWN STRL REUS W/TWL LRG LVL3 (GOWN DISPOSABLE) ×3
GOWN STRL REUS W/TWL XL LVL3 (GOWN DISPOSABLE) ×6
HANDPIECE INTERPULSE COAX TIP (DISPOSABLE)
HEAD M SROM 36MM PLUS 1.5 (Hips) IMPLANT
HOOD PEEL AWAY FACE SHEILD DIS (HOOD) ×2 IMPLANT
IMMOBILIZER KNEE 20 (SOFTGOODS)
IMMOBILIZER KNEE 20 THIGH 36 (SOFTGOODS) IMPLANT
IMMOBILIZER KNEE 22 UNIV (SOFTGOODS) IMPLANT
IMMOBILIZER KNEE 24 THIGH 36 (MISCELLANEOUS) IMPLANT
IMMOBILIZER KNEE 24 UNIV (MISCELLANEOUS)
KIT BASIN OR (CUSTOM PROCEDURE TRAY) ×3 IMPLANT
KIT TURNOVER KIT B (KITS) ×3 IMPLANT
LINER ACETAB NEUTRAL 36ID 520D (Liner) ×4 IMPLANT
MANIFOLD NEPTUNE II (INSTRUMENTS) ×3 IMPLANT
NDL HYPO 25GX1X1/2 BEV (NEEDLE) IMPLANT
NDL SUT .5 MAYO 1.404X.05X (NEEDLE) ×1 IMPLANT
NEEDLE HYPO 25GX1X1/2 BEV (NEEDLE) IMPLANT
NEEDLE MAYO TAPER (NEEDLE)
NS IRRIG 1000ML POUR BTL (IV SOLUTION) ×3 IMPLANT
PACK TOTAL JOINT (CUSTOM PROCEDURE TRAY) ×3 IMPLANT
PACK UNIVERSAL I (CUSTOM PROCEDURE TRAY) ×3 IMPLANT
PAD ARMBOARD 7.5X6 YLW CONV (MISCELLANEOUS) ×6 IMPLANT
PASSER SUT SWANSON 36MM LOOP (INSTRUMENTS) ×1 IMPLANT
PILLOW ABDUCTION MEDIUM (MISCELLANEOUS) IMPLANT
PIN SECTOR W/GRIP ACE CUP 52MM (Hips) ×2 IMPLANT
PIN STEINMAN 3/16 (PIN) IMPLANT
SET HNDPC FAN SPRY TIP SCT (DISPOSABLE) IMPLANT
SPONGE LAP 18X18 RF (DISPOSABLE) ×2 IMPLANT
SPONGE LAP 4X18 RFD (DISPOSABLE) IMPLANT
SROM M HEAD 36MM PLUS 1.5 (Hips) ×3 IMPLANT
STAPLER VISISTAT 35W (STAPLE) ×3 IMPLANT
STEM CORAIL KA11 (Stem) ×2 IMPLANT
SUCTION FRAZIER HANDLE 10FR (MISCELLANEOUS) ×3
SUCTION TUBE FRAZIER 10FR DISP (MISCELLANEOUS) ×1 IMPLANT
SUT ETHIBOND NAB CT1 #1 30IN (SUTURE) ×4 IMPLANT
SUT VIC AB 0 CT1 27 (SUTURE) ×3
SUT VIC AB 0 CT1 27XBRD ANBCTR (SUTURE) ×2 IMPLANT
SUT VIC AB 1 CT1 27 (SUTURE) ×3
SUT VIC AB 1 CT1 27XBRD ANBCTR (SUTURE) ×2 IMPLANT
SUT VIC AB 2-0 CT1 27 (SUTURE) ×3
SUT VIC AB 2-0 CT1 TAPERPNT 27 (SUTURE) ×4 IMPLANT
SYR CONTROL 10ML LL (SYRINGE) IMPLANT
TOWEL GREEN STERILE (TOWEL DISPOSABLE) ×3 IMPLANT
TOWEL GREEN STERILE FF (TOWEL DISPOSABLE) ×3 IMPLANT
TRAY FOLEY MTR SLVR 16FR STAT (SET/KITS/TRAYS/PACK) IMPLANT
WATER STERILE IRR 1000ML POUR (IV SOLUTION) ×6 IMPLANT

## 2019-12-22 NOTE — Brief Op Note (Signed)
12/21/2019 - 12/22/2019  5:34 PM  PATIENT:  Robin Grant  86 y.o. female  PRE-OPERATIVE DIAGNOSIS:  Left Hip Fracture  POST-OPERATIVE DIAGNOSIS:  Left Hip Fracture  PROCEDURE:  Procedure(s): TOTAL HIP ARTHROPLASTY (Left)  SURGEON:  Surgeon(s) and Role:    Kathryne Hitch, MD - Primary  PHYSICIAN ASSISTANT: Rexene Edison, PA-C  ANESTHESIA:   general  EBL:  200 mL   COUNTS:  YES  PATIENT DISPOSITION:  PACU - hemodynamically stable.   Delay start of Pharmacological VTE agent (>24hrs) due to surgical blood loss or risk of bleeding: no

## 2019-12-22 NOTE — Transfer of Care (Signed)
Immediate Anesthesia Transfer of Care Note  Patient: Robin Grant  Procedure(s) Performed: TOTAL HIP ARTHROPLASTY (Left Hip)  Patient Location: PACU  Anesthesia Type:General  Level of Consciousness: awake  Airway & Oxygen Therapy: Patient Spontanous Breathing and Patient connected to face mask oxygen  Post-op Assessment: Report given to RN and Post -op Vital signs reviewed and stable  Post vital signs: Reviewed and stable  Last Vitals:  Vitals Value Taken Time  BP 154/80 12/22/19 1757  Temp    Pulse 95 12/22/19 1800  Resp 20 12/22/19 1800  SpO2 99 % 12/22/19 1800  Vitals shown include unvalidated device data.  Last Pain:  Vitals:   12/22/19 0825  TempSrc:   PainSc: 0-No pain         Complications: No apparent anesthesia complications

## 2019-12-22 NOTE — Progress Notes (Signed)
Hospitalist progress note   Patient from home, Dispo skilled facility once surgery is performed and patient can work with therapy  Robin Grant [id number] DOB: 1931/10/07 DOA: 12/21/2019  PCP: [name], MD   Narrative:  44 white female HLD, hypothyroid, dementia, CKD 3, reflux, HTN, prior noncardiac chest pain, last admitted 04/2018 heatstroke at [facility] Presented 3/15 multiple falls with inability to ambulate-came to [facility], ED found to have BUN/creatinine 44/2.02 bicarb 18 as well as left femoral neck fracture  Data Reviewed:  Labs are pending from this morning  Assessment & Plan:  Left femoral neck fracture Mod-high risk procedure given orthopedic, but no Heart h/o await screening labs this morning  my perspective stabilized for surgery Defer pain control weightbearing status postop anticoagulation and postop follow-up to orthopedics Acute kidney injury superimposed on CKD 3 Follow a.m. labs, continue LR 75 cc/H and repeat labs a.m. Continue sodium bicarb 650 twice daily Dementia, agitation and dementia May continue Zoloft 100 daily, Aricept 5 daily, Ativan 0.5-1 twice daily Has had dementia--lives with Brother [name]  HTn Uncontrolled--start Norvasc 5 today Hypothyroidism Continue levothyroxine 88 mcg every morning-needs outpatient TSH in 2 to 3 weeks will likely be delayed  DVT prophylaxis continue heparin-called son [phone] [[facility]PAO] and updated  Subjective Quite confused-think she is at home, thinks this is [place], Subjective: Thinks the year is 2002 and her review systems cannot be completed because of this  Consultants:   Orthopedics  Objective: Vitals:   12/21/19 1953 12/21/19 2011 12/21/19 2132 12/22/19 0323  BP: (!) 152/96 (!) 158/97 (!) 174/108 (!) 144/87  Pulse: 100 98 99 97  Resp: (!) 26 (!) 28 17 17   Temp:   98.2 F (36.8 C) 98.2 F (36.8 C)  TempSrc:   Oral Oral  SpO2: 98% 98% 98% 97%  Weight:      Height:        Intake/Output  Summary (Last 24 hours) at 12/22/2019 0731 Last data filed at 12/22/2019 0400 Gross per 24 hour  Intake 296 ml  Output --  Net 296 ml   Filed Weights   12/21/19 1146  Weight: 49.9 kg    Examination: Awake but confused cta bo no added sound no rales no rhonchi Abdomen soft nt LLE externall rotated Neuro intact to power   Scheduled Meds: . chlorhexidine  60 mL Topical Once  . donepezil  5 mg Oral QHS  . heparin  5,000 Units Subcutaneous Q8H  . levothyroxine  88 mcg Oral QAC breakfast  . Melatonin  3 mg Oral QHS  . multivitamin with minerals  1 tablet Oral Daily  . povidone-iodine  2 application Topical Once  . sertraline  100 mg Oral Daily  . sodium bicarbonate  650 mg Oral BID   Continuous Infusions: .  ceFAZolin (ANCEF) IV    . lactated ringers 75 mL/hr at 12/21/19 2214  . tranexamic acid       LOS: 1 day   Time spent: 58 30, MD [facility]  12/22/2019, 7:31 AM

## 2019-12-22 NOTE — Progress Notes (Signed)
Patient has been monitored while in Short Stay.

## 2019-12-22 NOTE — Plan of Care (Signed)

## 2019-12-22 NOTE — Anesthesia Preprocedure Evaluation (Addendum)
Anesthesia Evaluation  Patient identified by MRN, date of birth, ID band Patient awake and Patient confused    Reviewed: Allergy & Precautions, NPO status , Patient's Chart, lab work & pertinent test results  History of Anesthesia Complications Negative for: history of anesthetic complications  Airway Mallampati: II  TM Distance: >3 FB Neck ROM: Full    Dental  (+) Missing, Partial Upper,    Pulmonary former smoker,    Pulmonary exam normal        Cardiovascular hypertension, Normal cardiovascular exam  TTE 2018: EF 60-65%, grade 1 diastolic dysfunction, mild TR   Neuro/Psych Anxiety Dementia negative neurological ROS     GI/Hepatic negative GI ROS, Neg liver ROS,   Endo/Other  Hypothyroidism   Renal/GU Renal InsufficiencyRenal disease (Cr 2.03)  negative genitourinary   Musculoskeletal negative musculoskeletal ROS (+)   Abdominal   Peds  Hematology  (+) anemia , Hgb 11.1   Anesthesia Other Findings Day of surgery medications reviewed with patient.  Reproductive/Obstetrics negative OB ROS                            Anesthesia Physical Anesthesia Plan  ASA: II  Anesthesia Plan: General   Post-op Pain Management:    Induction: Intravenous  PONV Risk Score and Plan: 4 or greater and Treatment may vary due to age or medical condition and Ondansetron  Airway Management Planned: Oral ETT  Additional Equipment: None  Intra-op Plan:   Post-operative Plan: Extubation in OR  Informed Consent: I have reviewed the patients History and Physical, chart, labs and discussed the procedure including the risks, benefits and alternatives for the proposed anesthesia with the patient or authorized representative who has indicated his/her understanding and acceptance.     Dental advisory given and Consent reviewed with POA  Plan Discussed with: CRNA  Anesthesia Plan Comments: (Consent  obtained from POA (son [name]) over telephone. Stephannie Peters, MD)       Anesthesia Quick Evaluation

## 2019-12-22 NOTE — Progress Notes (Signed)
Patient ID: Robin Grant, female   DOB: [birth year], 86 y.o.   MRN: [id number] I have seen and examined the patient and reviewed her x-rays.  She does have a displaced left hip femoral neck fracture.  We have recommended a left hip hemiarthroplasty vs a total hip since she does ambulate and responds to pain.  She does follow commands appropriately and reports significant left hip pain.  This fracture occurred after a mechanical fall a few days ago.

## 2019-12-22 NOTE — Plan of Care (Signed)
  Problem: Activity: Goal: Ability to ambulate and perform ADLs will improve Outcome: Progressing   Problem: Self-Concept: Goal: Ability to maintain and perform role responsibilities to the fullest extent possible will improve Outcome: Progressing   Problem: Pain Management: Goal: Pain level will decrease Outcome: Progressing   

## 2019-12-22 NOTE — Anesthesia Postprocedure Evaluation (Signed)
Anesthesia Post Note  Patient: Robin Grant  Procedure(s) Performed: TOTAL HIP ARTHROPLASTY (Left Hip)     Patient location during evaluation: PACU Anesthesia Type: General Level of consciousness: awake and alert Pain management: pain level controlled Vital Signs Assessment: post-procedure vital signs reviewed and stable Respiratory status: spontaneous breathing, nonlabored ventilation, respiratory function stable and patient connected to nasal cannula oxygen Cardiovascular status: blood pressure returned to baseline and stable Postop Assessment: no apparent nausea or vomiting Anesthetic complications: no    Last Vitals:  Vitals:   12/22/19 0742 12/22/19 1800  BP: 129/73 (!) 154/80  Pulse: 85 95  Resp: 17 20  Temp: 36.7 C 36.9 C  SpO2: 95% 99%    Last Pain:  Vitals:   12/22/19 1800  TempSrc:   PainSc: 0-No pain                 Trevor Iha

## 2019-12-22 NOTE — Anesthesia Procedure Notes (Signed)
Procedure Name: Intubation Date/Time: 12/22/2019 4:04 PM Performed by: Lovie Chol, CRNA Pre-anesthesia Checklist: Patient identified, Emergency Drugs available, Suction available, Patient being monitored and Timeout performed Patient Re-evaluated:Patient Re-evaluated prior to induction Oxygen Delivery Method: Circle system utilized Preoxygenation: Pre-oxygenation with 100% oxygen Induction Type: IV induction, Rapid sequence and Cricoid Pressure applied Laryngoscope Size: Miller and 2 Grade View: Grade I Tube type: Oral Tube size: 7.0 mm Number of attempts: 1 Airway Equipment and Method: Stylet Placement Confirmation: ETT inserted through vocal cords under direct vision,  positive ETCO2 and breath sounds checked- equal and bilateral Secured at: 21 cm Tube secured with: Tape Dental Injury: Teeth and Oropharynx as per pre-operative assessment

## 2019-12-23 ENCOUNTER — Encounter (HOSPITAL_COMMUNITY): Payer: Self-pay | Admitting: Internal Medicine

## 2019-12-23 LAB — CBC WITH DIFFERENTIAL/PLATELET
Abs Immature Granulocytes: 1.06 10*3/uL — ABNORMAL HIGH (ref 0.00–0.07)
Basophils Absolute: 0 10*3/uL (ref 0.0–0.1)
Basophils Relative: 0 %
Eosinophils Absolute: 0.1 10*3/uL (ref 0.0–0.5)
Eosinophils Relative: 1 %
HCT: 33.8 % — ABNORMAL LOW (ref 36.0–46.0)
Hemoglobin: 10.9 g/dL — ABNORMAL LOW (ref 12.0–15.0)
Immature Granulocytes: 9 %
Lymphocytes Relative: 6 %
Lymphs Abs: 0.7 10*3/uL (ref 0.7–4.0)
MCH: 30.4 pg (ref 26.0–34.0)
MCHC: 32.2 g/dL (ref 30.0–36.0)
MCV: 94.4 fL (ref 80.0–100.0)
Monocytes Absolute: 0.7 10*3/uL (ref 0.1–1.0)
Monocytes Relative: 6 %
Neutro Abs: 9.1 10*3/uL — ABNORMAL HIGH (ref 1.7–7.7)
Neutrophils Relative %: 78 %
Platelets: 263 10*3/uL (ref 150–400)
RBC: 3.58 MIL/uL — ABNORMAL LOW (ref 3.87–5.11)
RDW: 14.4 % (ref 11.5–15.5)
WBC: 11.6 10*3/uL — ABNORMAL HIGH (ref 4.0–10.5)
nRBC: 0 % (ref 0.0–0.2)

## 2019-12-23 LAB — BASIC METABOLIC PANEL
Anion gap: 13 (ref 5–15)
BUN: 29 mg/dL — ABNORMAL HIGH (ref 8–23)
CO2: 18 mmol/L — ABNORMAL LOW (ref 22–32)
Calcium: 8.7 mg/dL — ABNORMAL LOW (ref 8.9–10.3)
Chloride: 107 mmol/L (ref 98–111)
Creatinine, Ser: 1.83 mg/dL — ABNORMAL HIGH (ref 0.44–1.00)
GFR calc Af Amer: 28 mL/min — ABNORMAL LOW (ref >60)
GFR calc non Af Amer: 25 mL/min — ABNORMAL LOW (ref >60)
Glucose, Bld: 100 mg/dL — ABNORMAL HIGH (ref 70–99)
Potassium: 4.6 mmol/L (ref 3.5–5.1)
Sodium: 138 mmol/L (ref 135–145)

## 2019-12-23 MED ORDER — ORAL CARE MOUTH RINSE
15.0000 mL | Freq: Two times a day (BID) | OROMUCOSAL | Status: DC
  Administered 2019-12-23 – 2019-12-24 (×3): 15 mL via OROMUCOSAL

## 2019-12-23 MED ORDER — CHLORHEXIDINE GLUCONATE 0.12 % MT SOLN
15.0000 mL | Freq: Two times a day (BID) | OROMUCOSAL | Status: DC
  Administered 2019-12-23 – 2019-12-24 (×2): 15 mL via OROMUCOSAL
  Filled 2019-12-23 (×2): qty 15

## 2019-12-23 NOTE — TOC Initial Note (Signed)
Transition of Care [facility]) - Initial/Assessment Note    Patient Details  Name: Robin Grant MRN: [id number] Date of Birth: [birth year]  Transition of Care [facility]) CM/SW Contact:    Janae Bridgeman, RN Phone Number: 12/23/2019, 1:52 PM  Clinical Narrative:        Patient is a pleasant, but demented 86 year old, S/P Left total hip arthroplasty after unwitnessed fall at home last Wednesday.  Patient lives at home with her son and daughter-in-law.  The patient's family stated that she ambulated well with PT and they mentioned that they would like to take the patient home.  She has all needed equipment at home including rolling walker, 3:1 commode, hospital bed, and lift chair.  The patient has 24-hr supervision with family at the home.   Patient's primary physician is Dr. [name].  [facility] [place]. Patient's son was given Medicare Choice for home health services and he plans to decide which home health agencies that they prefer after speaking with the patient's POA, son named [name]. No other barriers to discharge are noted at this time.            Expected Discharge Plan: Home w Home Health Services Barriers to Discharge: No Barriers Identified   Patient Goals and CMS Choice Patient states their goals for this hospitalization and ongoing recovery are:: Patient is demented.  Son, [name], states that they plan to take the patient home for recovery with home health and not a skilled facility. CMS Medicare.gov Compare Post Acute Care list provided to:: Patient Represenative (must comment)(son, Darnelle Bos) Choice offered to / list presented to : Adult Children  Expected Discharge Plan and Services Expected Discharge Plan: Home w Home Health Services   Discharge Planning Services: CM Consult Post Acute Care Choice: Home Health Living arrangements for the past 2 months: Single Family Home                 DME Arranged: (Patient has all needed  equipment/DME at home - including lift chair, rolling walker, 3:1 commode, hospital bed)                    Prior Living Arrangements/Services Living arrangements for the past 2 months: Single Family Home Lives with:: Adult Children Patient language and need for interpreter reviewed:: Yes Do you feel safe going back to the place where you live?: Yes      Need for Family Participation in Patient Care: Yes (Comment) Care giver support system in place?: Yes (comment) Current home services: DME Criminal Activity/Legal Involvement Pertinent to Current Situation/Hospitalization: No - Comment as needed  Activities of Daily Living Home Assistive Devices/Equipment: None ADL Screening (condition at time of admission) Patient's cognitive ability adequate to safely complete daily activities?: No Is the patient deaf or have difficulty hearing?: No Does the patient have difficulty seeing, even when wearing glasses/contacts?: No Does the patient have difficulty concentrating, remembering, or making decisions?: Yes Patient able to express need for assistance with ADLs?: No Does the patient have difficulty dressing or bathing?: Yes Independently performs ADLs?: No Communication: Needs assistance Is this a change from baseline?: Change from baseline, expected to last >3 days Dressing (OT): Needs assistance Is this a change from baseline?: Change from baseline, expected to last >3 days Grooming: Needs assistance Is this a change from baseline?: Change from baseline, expected to last >3 days Feeding: Needs assistance Is this a change from baseline?: Pre-admission baseline Bathing: Needs  assistance Is this a change from baseline?: Pre-admission baseline Toileting: Needs assistance Is this a change from baseline?: Change from baseline, expected to last >3days In/Out Bed: Needs assistance Is this a change from baseline?: Change from baseline, expected to last >3 days Walks in Home: Needs  assistance Is this a change from baseline?: Change from baseline, expected to last >3 days Does the patient have difficulty walking or climbing stairs?: Yes Weakness of Legs: Both Weakness of Arms/Hands: None  Permission Sought/Granted Permission sought to share information with : Case Manager Permission granted to share information with : Yes, Verbal Permission Granted              Emotional Assessment Appearance:: Appears stated age Attitude/Demeanor/Rapport: Gracious Affect (typically observed): Accepting Orientation: : Oriented to Self, Oriented to Place Alcohol / Substance Use: Not Applicable Psych Involvement: No (comment)  Admission diagnosis:  Hip fracture ([place]) [S72.009A] Closed fracture of left hip, initial encounter [facility]) [S72.002A] Patient Active Problem List   Diagnosis Date Noted  . Hip fracture ([place]) 12/21/2019  . Heat stroke and sunstroke 04/06/2018  . Acute lower UTI 04/06/2018  . Dehydration 12/03/2017  . Hypothyroidism 12/03/2017  . Acidosis 12/03/2017  . Acute renal failure superimposed on stage 3 chronic kidney disease ([place]) 12/02/2017  . Left sided chest pain 07/19/2017  . Mixed hyperlipidemia 07/19/2017  . Rectal bleeding 07/26/2013  . Diverticulosis 07/26/2013  . Benign hypertension 07/26/2013  . Chronic kidney disease, stage III (moderate) 07/26/2013  . Left sided abdominal pain 05/03/2011  . Laryngopharyngeal reflux (LPR) 05/03/2011  . Esophageal dysphagia 05/03/2011  . CONSTIPATION 02/15/2009  . Hyperlipidemia 02/14/2009  . Essential hypertension 02/14/2009   PCP:  Asencion Noble, MD Pharmacy:   [id number] [place], [address] [place] [address] Phone: [phone] Fax: [phone]     Social Determinants of Health (SDOH) Interventions    Readmission Risk Interventions No flowsheet data found.

## 2019-12-23 NOTE — Plan of Care (Addendum)
Pt and family state they would prefer [facility] for D/C.   Problem: Education: Goal: Verbalization of understanding the information provided (i.e., activity precautions, restrictions, etc) will improve Outcome: Progressing   Problem: Activity: Goal: Ability to ambulate and perform ADLs will improve Outcome: Progressing   Problem: Clinical Measurements: Goal: Postoperative complications will be avoided or minimized Outcome: Progressing   Problem: Self-Concept: Goal: Ability to maintain and perform role responsibilities to the fullest extent possible will improve Outcome: Progressing   Problem: Pain Management: Goal: Pain level will decrease Outcome: Progressing

## 2019-12-23 NOTE — Evaluation (Signed)
Physical Therapy Evaluation Patient Details Name: Robin Grant MRN: [id number] DOB: 08-Apr-1931 Today's Date: 12/23/2019   History of Present Illness  Robin Grant is a 86 y.o. female with medical history significant for dementia, dyslipidemia, hypothyroidism, and CKD stage IIIb who presented to the ED with multiple falls over this past week. Found to have L femoral neck fx; now s/p L THA, WBAT  Clinical Impression   Pt is s/p THA resulting in the deficits listed below (see PT Problem List). Comes from home where she was ambulatory with RW (per son, needs encouragement to use it); One level home with a few steps to enter, Lives with son and daughter in law; Presents to PT with L hip pain which is effecting her functional mobility, also nausea limiting activity tolerance; Still, even with that, she only needed min assist to get to eOB, to stand, and to walk a short distance to recliner;  Pt will benefit from skilled PT to increase their independence and safety with mobility to allow discharge to the venue listed below.      Follow Up Recommendations Home health PT;Supervision/Assistance - 24 hour    Equipment Recommendations  3in1 (PT)    Recommendations for Other Services OT consult(as ordered)     Precautions / Restrictions Precautions Precautions: Fall Restrictions LLE Weight Bearing: Weight bearing as tolerated      Mobility  Bed Mobility Overal bed mobility: Needs Assistance Bed Mobility: Supine to Sit     Supine to sit: Min assist     General bed mobility comments: Min assist for LLE  Transfers Overall transfer level: Needs assistance Equipment used: Rolling walker (2 wheeled) Transfers: Sit to/from Stand Sit to Stand: Min guard         General transfer comment: Cues for hand placement and safety; good tolerance of weight bearing LLE  Ambulation/Gait Ambulation/Gait assistance: Min assist;+2 safety/equipment Gait Distance (Feet): 6 Feet Assistive device:  Rolling walker (2 wheeled) Gait Pattern/deviations: Decreased step length - right;Decreased step length - left;Trunk flexed     General Gait Details: Min assist for RW management; pt walked straight to recliner and then sat down impulsively  Stairs            Wheelchair Mobility    Modified Rankin (Stroke Patients Only)       Balance     Sitting balance-Leahy Scale: Fair       Standing balance-Leahy Scale: Fair                               Pertinent Vitals/Pain Pain Assessment: Faces Faces Pain Scale: Hurts a little bit Pain Location: L hip  Pain Descriptors / Indicators: Grimacing;Guarding Pain Intervention(s): Limited activity within patient's tolerance    Home Living Family/patient expects to be discharged to:: Private residence Living Arrangements: Children Available Help at Discharge: Family Type of Home: House Home Access: Stairs to enter Entrance Stairs-Rails: Doctor, general practice of Steps: 3 Home Layout: One level Home Equipment: Environmental consultant - 2 wheels;Cane - single point;Hospital bed      Prior Function Level of Independence: Needs assistance   Gait / Transfers Assistance Needed: use of spc and RW, requires encouragement to use RW  ADL's / Homemaking Assistance Needed: family assists with toileting and bathing        Hand Dominance   Dominant Hand: Right    Extremity/Trunk Assessment   Upper Extremity Assessment Upper Extremity Assessment: Defer to OT  evaluation    Lower Extremity Assessment Lower Extremity Assessment: LLE deficits/detail LLE Deficits / Details: Grossly decr AROM and strength, limited by pain post fracture and surgical fixation    Cervical / Trunk Assessment Cervical / Trunk Assessment: Normal  Communication   Communication: HOH  Cognition Arousal/Alertness: Awake/alert Behavior During Therapy: WFL for tasks assessed/performed Overall Cognitive Status: History of cognitive impairments - at  baseline                                 General Comments: pt with hx of dementia, son reports pt is at her baseline;pt is HOH difficult to fully assess cognition vs hearing       General Comments General comments (skin integrity, edema, etc.): pt nauseous with mobility    Exercises     Assessment/Plan    PT Assessment Patient needs continued PT services  PT Problem List Decreased strength;Decreased range of motion;Decreased activity tolerance;Decreased balance;Decreased mobility;Decreased coordination;Decreased cognition;Decreased knowledge of use of DME;Decreased safety awareness;Decreased knowledge of precautions;Pain       PT Treatment Interventions DME instruction;Gait training;Stair training;Functional mobility training;Therapeutic activities;Therapeutic exercise;Balance training;Cognitive remediation;Patient/family education    PT Goals (Current goals can be found in the Care Plan section)  Acute Rehab PT Goals Patient Stated Goal: pt's son's goal: for pt to return home PT Goal Formulation: With patient/family Time For Goal Achievement: 01/06/20 Potential to Achieve Goals: Good    Frequency Min 5X/week   Barriers to discharge        Co-evaluation               AM-PAC PT "6 Clicks" Mobility  Outcome Measure Help needed turning from your back to your side while in a flat bed without using bedrails?: A Little Help needed moving from lying on your back to sitting on the side of a flat bed without using bedrails?: A Little Help needed moving to and from a bed to a chair (including a wheelchair)?: A Little Help needed standing up from a chair using your arms (e.g., wheelchair or bedside chair)?: A Little Help needed to walk in hospital room?: A Little Help needed climbing 3-5 steps with a railing? : A Little 6 Click Score: 18    End of Session Equipment Utilized During Treatment: Gait belt Activity Tolerance: Patient tolerated treatment  well Patient left: in chair;with call bell/phone within reach;with chair alarm set;with family/visitor present Nurse Communication: Mobility status PT Visit Diagnosis: Other abnormalities of gait and mobility (R26.89);Pain Pain - Right/Left: Left Pain - part of body: Hip    Time: 1250-1310 PT Time Calculation (min) (ACUTE ONLY): 20 min   Charges:   PT Evaluation $PT Eval Moderate Complexity: 1 Mod          Roney Marion, [place]  Acute Rehabilitation Services Pager [phone] Office [phone]   Colletta [place] 12/23/2019, 6:11 PM

## 2019-12-23 NOTE — Evaluation (Signed)
Occupational Therapy Evaluation Patient Details Name: Robin Grant MRN: [id number] DOB: [birth year] Today's Date: 12/23/2019    History of Present Illness BYRON TIPPING is a 86 y.o. female with medical history significant for dementia, dyslipidemia, hypothyroidism, and CKD stage IIIb who presented to the ED with multiple falls over this past week. Found to have L femoral neck fx; now s/p L THA, WBAT   Clinical Impression   PTA, pt was living at home with her son and daughter-in-law, pt's other son present during session and provided home information and prior level of functioning. He reports pt required assistance with ADL/IADL and assistance with transfers using either a spc or RW. Pt currently requires minguard to minA for completion of ADL and functional mobility. She was limited this session due to nausea, RN aware. Son reports it is not uncommon for pt to get nauseous with movement. Recommend returning home to familiar environment with available assistance from family and [place] with available assistance from family to maximize pt's safety and independence with ADL/IADL and functional mobility. Will continue to follow acutely.     Follow Up Recommendations  Home health OT;Supervision/Assistance - 24 hour    Equipment Recommendations  None recommended by OT    Recommendations for Other Services       Precautions / Restrictions Precautions Precautions: Fall Restrictions Weight Bearing Restrictions: No LLE Weight Bearing: Weight bearing as tolerated      Mobility Bed Mobility               General bed mobility comments: pt sitting in recliner upon arrival  Transfers Overall transfer level: Needs assistance Equipment used: 1 person hand held assist Transfers: Sit to/from Stand Sit to Stand: Min assist         General transfer comment: minA for stability in standing    Balance Overall balance assessment: Needs assistance Sitting-balance support: No upper  extremity supported;Feet supported Sitting balance-Leahy Scale: Fair     Standing balance support: Single extremity supported;During functional activity Standing balance-Leahy Scale: Fair Standing balance comment: pt can static stand without support;requires single UE support for reaching outside base of support                           ADL either performed or assessed with clinical judgement   ADL Overall ADL's : Needs assistance/impaired Eating/Feeding: Set up;Sitting Eating/Feeding Details (indicate cue type and reason): pt on full liquid diet Grooming: Set up;Sitting   Upper Body Bathing: Min guard;Sitting   Lower Body Bathing: Minimal assistance;Sit to/from stand Lower Body Bathing Details (indicate cue type and reason): minA for stability in standing Upper Body Dressing : Min guard;Sitting   Lower Body Dressing: Minimal assistance;Sit to/from stand Lower Body Dressing Details (indicate cue type and reason): for access to BLE Toilet Transfer: Minimal assistance;Stand-pivot   Toileting- Clothing Manipulation and Hygiene: Minimal assistance;Sit to/from stand       Functional mobility during ADLs: Minimal assistance General ADL Comments: minA for stabiltiy;pt limited by nausea      Vision Baseline Vision/History: Wears glasses Wears Glasses: At all times Patient Visual Report: No change from baseline       Perception     Praxis      Pertinent Vitals/Pain Pain Assessment: Faces Faces Pain Scale: Hurts a little bit Pain Location: L hip  Pain Descriptors / Indicators: Grimacing;Guarding Pain Intervention(s): Limited activity within patient's tolerance;Monitored during session     Hand Dominance Right  Extremity/Trunk Assessment Upper Extremity Assessment Upper Extremity Assessment: Overall WFL for tasks assessed   Lower Extremity Assessment Lower Extremity Assessment: Defer to PT evaluation   Cervical / Trunk Assessment Cervical / Trunk  Assessment: Normal   Communication Communication Communication: HOH   Cognition Arousal/Alertness: Awake/alert Behavior During Therapy: WFL for tasks assessed/performed Overall Cognitive Status: History of cognitive impairments - at baseline                                 General Comments: pt with hx of dementia, son reports pt is at her baseline;pt is HOH difficult to fully assess cognition vs hearing    General Comments  pt nauseous with mobility    Exercises     Shoulder Instructions      Home Living Family/patient expects to be discharged to:: Private residence Living Arrangements: Children Available Help at Discharge: Family Type of Home: House Home Access: Stairs to enter Secretary/administrator of Steps: 3 Entrance Stairs-Rails: Right;Left;Can reach both Home Layout: One level     Bathroom Shower/Tub: Chief Strategy Officer: Standard Bathroom Accessibility: Yes   Home Equipment: Environmental consultant - 2 wheels;Cane - single point;Hospital bed          Prior Functioning/Environment Level of Independence: Needs assistance  Gait / Transfers Assistance Needed: use of spc and RW, requires encouragement to use RW ADL's / Homemaking Assistance Needed: family assists with toileting and bathing            OT Problem List: Decreased strength;Decreased range of motion;Decreased activity tolerance;Impaired balance (sitting and/or standing);Decreased safety awareness;Decreased cognition;Decreased knowledge of use of DME or AE;Decreased knowledge of precautions;Pain      OT Treatment/Interventions: Self-care/ADL training;Therapeutic exercise;DME and/or AE instruction;Therapeutic activities;Cognitive remediation/compensation;Patient/family education;Balance training    OT Goals(Current goals can be found in the care plan section) Acute Rehab OT Goals Patient Stated Goal: pt's son's goal: for pt to return home OT Goal Formulation: With family Time For Goal  Achievement: 01/06/20 Potential to Achieve Goals: Good ADL Goals Pt Will Perform Grooming: with supervision;standing Pt Will Perform Lower Body Dressing: with supervision;sit to/from stand Pt Will Transfer to Toilet: with min guard assist;ambulating Additional ADL Goal #1: Pt/caregiver will demonstrate use of 3 fall prevention strategies for pt while completing ADL/IADL and functional mobility.  OT Frequency: Min 2X/week   Barriers to D/C:            Co-evaluation              AM-PAC OT "6 Clicks" Daily Activity     Outcome Measure Help from another person eating meals?: A Little Help from another person taking care of personal grooming?: A Little Help from another person toileting, which includes using toliet, bedpan, or urinal?: A Little Help from another person bathing (including washing, rinsing, drying)?: A Little Help from another person to put on and taking off regular upper body clothing?: A Little Help from another person to put on and taking off regular lower body clothing?: A Little 6 Click Score: 18   End of Session Equipment Utilized During Treatment: Gait belt Nurse Communication: Mobility status  Activity Tolerance: Patient tolerated treatment well(pt limited by nausea ) Patient left: in chair;with chair alarm set;with call bell/phone within reach  OT Visit Diagnosis: Unsteadiness on feet (R26.81);Other abnormalities of gait and mobility (R26.89);Muscle weakness (generalized) (M62.81);History of falling (Z91.81);Other symptoms and signs involving cognitive function;Pain Pain - Right/Left: Left Pain -  part of body: Hip                Time: 3887-1959 OT Time Calculation (min): 15 min Charges:  OT General Charges $OT Visit: 1 Visit OT Evaluation $OT Eval Moderate Complexity: 1 Mod  Kharizma Lesnick OTR/L Acute Rehabilitation Services Office: [phone]   Rebeca Alert 12/23/2019, 1:50 PM

## 2019-12-23 NOTE — Discharge Instructions (Signed)

## 2019-12-23 NOTE — Progress Notes (Addendum)
PROGRESS NOTE    [place]  SPQ:[id number] DOB: 1930/10/26 DOA: 12/21/2019 PCP: Asencion Noble, MD    Brief Narrative:  [place] is a 86 year-old caucasian female with PMHx remarkable for  HLD, hypothyroidism, dementia, CKD stage 3, GERD, essential hypertension, who presented following multiple falls at home with difficulty ambulating.  In the ED, she was found to have an elevated BUN / creatinine of 44/2.02 respectively as well as a left femoral neck fracture.  Patient was transferred from [facility] to [facility] for operative management with orthopedics.   Assessment & Plan:   Active Problems:   Hip fracture (HCC)  Left femoral neck fracture Left hip x-ray  Notable for left femoral neck fracture with varus angulation.  Underwent left total hip arthroplasty on 12/22/2019 by Dr. Ninfa Linden. --WBAT --DVT PPX with ASA 28m PO BID --pending PT eval --OT recommends HHOT --continue pain control   Acute on chronic kidney disease stage 3b Baseline creatinine 1.33-1.89 with GFR 23-36.   --Cr 2.03 on admission; down to 1.83 today --continue sodium bicarbonate 6567mPO BID --avoid nephotoxins; renally dose all medications --follow renal function daily  Dementia --continue zoloft 10069mO daily, Aricept 5mg28m daily --ativan 0.5-1.0 mg BID prn  Essential HTN --started on Amlodipine 5mg 21mdaily  Hypothyroidism --Levothyroxine 88mcg35mdaily    DVT prophylaxis: Heparin Code Status: Full Code Family Communication: updated patients son via telephone this afternoon Disposition Plan:      Patient is from: Home     Anticipated Disposition: Home with home health on 12/24/2019     Barriers to discharge or conditions that needs to be met prior to discharge: pending PT eval  Consultants:   Orthopedics - Dr. BlackmNinfa Lindenedures:   Left Total Hip arthoplasty - Dr. BlackmNinfa Linden6/2021  Antimicrobials:   none   Subjective: Patient seen examined bedside, resting  comfortably.  Pleasantly confused.  States pain is controlled.  No specific complaints this morning.  Denies headache, no visual changes, no chest pain, no palpitations, no shortness of breath, no abdominal pain.  No acute events overnight per nursing staff.  Objective: Vitals:   12/22/19 2037 12/23/19 0046 12/23/19 0533 12/23/19 0722  BP: (!) 158/85 137/79 (!) 149/85 133/77  Pulse: 84 88 89 86  Resp: '15 16 15 16  ' Temp: 98.5 F (36.9 C) 97.9 F (36.6 C) 98.3 F (36.8 C) (!) 97.4 F (36.3 C)  TempSrc: Oral Oral Oral Oral  SpO2: 97% 97% 95%   Weight:      Height:        Intake/Output Summary (Last 24 hours) at 12/23/2019 1543 Last data filed at 12/23/2019 0900 Gross per 24 hour  Intake 1388.62 ml  Output 200 ml  Net 1188.62 ml   Filed Weights   12/21/19 1146  Weight: 49.9 kg    Examination:  General exam: Appears calm and comfortable  Respiratory system: Clear to auscultation. Respiratory effort normal. Cardiovascular system: S1 & S2 heard, RRR. No JVD, murmurs, rubs, gallops or clicks. No pedal edema. Gastrointestinal system: Abdomen is nondistended, soft and nontender. No organomegaly or masses felt. Normal bowel sounds heard. Central nervous system: Alert. No focal neurological deficits. Extremities: Symmetric 5 x 5 power. Surgical dressings noted; c/d/i Skin: No rashes, lesions or ulcers Psychiatry: Judgement and insight appear poor. Mood & affect appropriate.     Data Reviewed: I have personally reviewed following labs and imaging studies  CBC: Recent Labs  Lab 12/21/19 1246 12/22/19  9798 12/23/19 0150  WBC 6.8 7.0 11.6*  NEUTROABS 4.8 4.9 9.1*  HGB 13.2 11.1* 10.9*  HCT 41.9 34.0* 33.8*  MCV 94.4 91.9 94.4  PLT 251 247 921   Basic Metabolic Panel: Recent Labs  Lab 12/21/19 1246 12/22/19 0819 12/23/19 0150  NA 137 138 138  K 4.0 3.7 4.6  CL 107 108 107  CO2 18* 18* 18*  GLUCOSE 90 84 100*  BUN 44* 38* 29*  CREATININE 2.02* 2.03* 1.83*  CALCIUM  9.1 8.7* 8.7*   GFR: Estimated Creatinine Clearance: 17.4 mL/min (A) (by C-G formula based on SCr of 1.83 mg/dL (H)). Liver Function Tests: Recent Labs  Lab 12/21/19 1246 12/22/19 0819  AST 21 24  ALT 18 19  ALKPHOS 148* 120  BILITOT 2.0* 1.6*  PROT 6.6 5.3*  ALBUMIN 3.0* 2.3*   No results for input(s): LIPASE, AMYLASE in the last 168 hours. No results for input(s): AMMONIA in the last 168 hours. Coagulation Profile: No results for input(s): INR, PROTIME in the last 168 hours. Cardiac Enzymes: Recent Labs  Lab 12/21/19 1246  CKTOTAL 125   BNP (last 3 results) No results for input(s): PROBNP in the last 8760 hours. HbA1C: No results for input(s): HGBA1C in the last 72 hours. CBG: No results for input(s): GLUCAP in the last 168 hours. Lipid Profile: No results for input(s): CHOL, HDL, LDLCALC, TRIG, CHOLHDL, LDLDIRECT in the last 72 hours. Thyroid Function Tests: No results for input(s): TSH, T4TOTAL, FREET4, T3FREE, THYROIDAB in the last 72 hours. Anemia Panel: No results for input(s): VITAMINB12, FOLATE, FERRITIN, TIBC, IRON, RETICCTPCT in the last 72 hours. Sepsis Labs: No results for input(s): PROCALCITON, LATICACIDVEN in the last 168 hours.  Recent Results (from the past 240 hour(s))  Respiratory Panel by RT PCR (Flu A&B, Covid) - Nasopharyngeal Swab     Status: None   Collection Time: 12/21/19  6:05 PM   Specimen: Nasopharyngeal Swab  Result Value Ref Range Status   SARS Coronavirus 2 by RT PCR NEGATIVE NEGATIVE Final    Comment: (NOTE) SARS-CoV-2 target nucleic acids are NOT DETECTED. The SARS-CoV-2 RNA is generally detectable in upper respiratoy specimens during the acute phase of infection. The lowest concentration of SARS-CoV-2 viral copies this assay can detect is 131 copies/mL. A negative result does not preclude SARS-Cov-2 infection and should not be used as the sole basis for treatment or other patient management decisions. A negative result may occur  with  improper specimen collection/handling, submission of specimen other than nasopharyngeal swab, presence of viral mutation(s) within the areas targeted by this assay, and inadequate number of viral copies (<131 copies/mL). A negative result must be combined with clinical observations, patient history, and epidemiological information. The expected result is Negative. Fact Sheet for Patients:  PinkCheek.be Fact Sheet for Healthcare Providers:  GravelBags.it This test is not yet ap proved or cleared by the [place] FDA and  has been authorized for detection and/or diagnosis of SARS-CoV-2 by FDA under an Emergency Use Authorization (EUA). This EUA will remain  in effect (meaning this test can be used) for the duration of the COVID-19 declaration under Section 564(b)(1) of the Act, 21 U.S.C. section 360bbb-3(b)(1), unless the authorization is terminated or revoked sooner.    Influenza A by PCR NEGATIVE NEGATIVE Final   Influenza B by PCR NEGATIVE NEGATIVE Final    Comment: (NOTE) The Xpert Xpress SARS-CoV-2/FLU/RSV assay is intended as an aid in  the diagnosis of influenza from Nasopharyngeal swab specimens and  should not be used as a sole basis for treatment. Nasal washings and  aspirates are unacceptable for Xpert Xpress SARS-CoV-2/FLU/RSV  testing. Fact Sheet for Patients: PinkCheek.be Fact Sheet for Healthcare Providers: GravelBags.it This test is not yet approved or cleared by the [place] FDA and  has been authorized for detection and/or diagnosis of SARS-CoV-2 by  FDA under an Emergency Use Authorization (EUA). This EUA will remain  in effect (meaning this test can be used) for the duration of the  Covid-19 declaration under Section 564(b)(1) of the Act, 21  U.S.C. section 360bbb-3(b)(1), unless the authorization is  terminated or revoked. Performed  at [facility], [address] [place],  [address]   Surgical pcr screen     Status: Abnormal   Collection Time: 12/21/19  9:55 PM   Specimen: Nasal Mucosa; Nasal Swab  Result Value Ref Range Status   MRSA, PCR NEGATIVE NEGATIVE Final   Staphylococcus aureus POSITIVE (A) NEGATIVE Final    Comment: (NOTE) The Xpert SA Assay (FDA approved for NASAL specimens in patients 25 years of age and older), is one component of a comprehensive surveillance program. It is not intended to diagnose infection nor to guide or monitor treatment.          Radiology Studies: DG Pelvis Portable  Result Date: 12/22/2019 CLINICAL DATA:  86 year old female status post total left hip arthroplasty. EXAM: PORTABLE PELVIS 1-2 VIEWS COMPARISON:  Left hip radiograph dated 12/21/2019. FINDINGS: There is a total left hip arthroplasty. The arthroplasty components appear intact and in anatomic alignment. There is no acute fracture or dislocation. The bones are osteopenic. Postsurgical changes in the soft tissues of the left hip with soft tissue air and cutaneous staples. IMPRESSION: Status post left hip arthroplasty.  No immediate complication. Electronically Signed   By: Anner Crete M.D.   On: 12/22/2019 19:48   DG C-Arm 1-60 Min  Result Date: 12/22/2019 CLINICAL DATA:  Hip fracture EXAM: OPERATIVE left HIP (WITH PELVIS IF PERFORMED) 4 VIEWS TECHNIQUE: Fluoroscopic spot image(s) were submitted for interpretation post-operatively. COMPARISON:  12/21/2019 FINDINGS: Four low resolution intraoperative spot views of the left hip. Total fluoroscopy time was 39 seconds. The initial images demonstrate a left femoral neck fracture. Subsequent placement of left hip replacement with normal alignment. IMPRESSION: Intraoperative fluoroscopic assistance provided during left hip replacement Electronically Signed   By: Donavan Foil M.D.   On: 12/22/2019 19:31   DG HIP OPERATIVE UNILAT W OR W/O PELVIS LEFT  Result Date:  12/22/2019 CLINICAL DATA:  Hip fracture EXAM: OPERATIVE left HIP (WITH PELVIS IF PERFORMED) 4 VIEWS TECHNIQUE: Fluoroscopic spot image(s) were submitted for interpretation post-operatively. COMPARISON:  12/21/2019 FINDINGS: Four low resolution intraoperative spot views of the left hip. Total fluoroscopy time was 39 seconds. The initial images demonstrate a left femoral neck fracture. Subsequent placement of left hip replacement with normal alignment. IMPRESSION: Intraoperative fluoroscopic assistance provided during left hip replacement Electronically Signed   By: Donavan Foil M.D.   On: 12/22/2019 19:31        Scheduled Meds: . amLODipine  5 mg Oral Daily  . aspirin  81 mg Oral BID  . chlorhexidine  15 mL Mouth Rinse BID  . Chlorhexidine Gluconate Cloth  6 each Topical Daily  . docusate sodium  100 mg Oral BID  . donepezil  5 mg Oral QHS  . heparin  5,000 Units Subcutaneous Q8H  . levothyroxine  88 mcg Oral QAC breakfast  . mouth rinse  15 mL Mouth  Rinse q12n4p  . Melatonin  3 mg Oral QHS  . multivitamin with minerals  1 tablet Oral Daily  . mupirocin ointment  1 application Nasal BID  . sertraline  100 mg Oral Daily  . sodium bicarbonate  650 mg Oral BID   Continuous Infusions: . sodium chloride 10 mL/hr at 12/22/19 1351  . sodium chloride 50 mL/hr at 12/22/19 2016  . lactated ringers    . methocarbamol (ROBAXIN) IV       LOS: 2 days    Time spent: 34 minutes spent on chart review, discussion with nursing staff, consultants, updating family and interview/physical exam; more than 50% of that time was spent in counseling and/or coordination of care.    Seaver Machia J [place] DO [facility] Available via Epic secure chat 7am-7pm After these hours, please refer to coverage provider listed on [email or web address] 12/23/2019, 3:43 PM

## 2019-12-23 NOTE — Progress Notes (Signed)
Subjective: 1 Day Post-Op Procedure(s) (LRB): TOTAL HIP ARTHROPLASTY (Left) Patient reports pain as moderate.  Tolerated surgery well late afternoon yesterday.  Awake and alert this am and does follow commands appropriately.    Objective: Vital signs in last 24 hours: Temp:  [97.4 F (36.3 C)-98.5 F (36.9 C)] 97.4 F (36.3 C) (03/17 0722) Pulse Rate:  [84-95] 86 (03/17 0722) Resp:  [12-21] 16 (03/17 0722) BP: (129-158)/(73-85) 133/77 (03/17 0722) SpO2:  [92 %-99 %] 97 % (03/17 0046) FiO2 (%):  [21 %] 21 % (03/16 1958)  Intake/Output from previous day: 03/16 0701 - 03/17 0700 In: 1268.6 [P.O.:160; I.V.:858.6; IV Piggyback:250] Out: 200 [Blood:200] Intake/Output this shift: No intake/output data recorded.  Recent Labs    12/21/19 1246 12/22/19 0819 12/23/19 0150  HGB 13.2 11.1* 10.9*   Recent Labs    12/22/19 0819 12/23/19 0150  WBC 7.0 11.6*  RBC 3.70* 3.58*  HCT 34.0* 33.8*  PLT 247 263   Recent Labs    12/22/19 0819 12/23/19 0150  NA 138 138  K 3.7 4.6  CL 108 107  CO2 18* 18*  BUN 38* 29*  CREATININE 2.03* 1.83*  GLUCOSE 84 100*  CALCIUM 8.7* 8.7*   No results for input(s): LABPT, INR in the last 72 hours.  Sensation intact distally Intact pulses distally Dorsiflexion/Plantar flexion intact Incision: dressing C/D/I   Assessment/Plan: 1 Day Post-Op Procedure(s) (LRB): TOTAL HIP ARTHROPLASTY (Left) Up with therapy - WBAT left hip, no precautions Fall risk Likely needs SNF post-hospital stay (lives with son; has a history of recent falls)      Kathryne Hitch 12/23/2019, 7:38 AM

## 2019-12-24 DIAGNOSIS — F0390 Unspecified dementia without behavioral disturbance: Secondary | ICD-10-CM | POA: Diagnosis present

## 2019-12-24 DIAGNOSIS — N179 Acute kidney failure, unspecified: Secondary | ICD-10-CM

## 2019-12-24 DIAGNOSIS — N1832 Chronic kidney disease, stage 3b: Secondary | ICD-10-CM

## 2019-12-24 DIAGNOSIS — E039 Hypothyroidism, unspecified: Secondary | ICD-10-CM

## 2019-12-24 DIAGNOSIS — I10 Essential (primary) hypertension: Secondary | ICD-10-CM

## 2019-12-24 LAB — BASIC METABOLIC PANEL
Anion gap: 12 (ref 5–15)
BUN: 24 mg/dL — ABNORMAL HIGH (ref 8–23)
CO2: 19 mmol/L — ABNORMAL LOW (ref 22–32)
Calcium: 8.6 mg/dL — ABNORMAL LOW (ref 8.9–10.3)
Chloride: 107 mmol/L (ref 98–111)
Creatinine, Ser: 1.6 mg/dL — ABNORMAL HIGH (ref 0.44–1.00)
GFR calc Af Amer: 33 mL/min — ABNORMAL LOW (ref >60)
GFR calc non Af Amer: 29 mL/min — ABNORMAL LOW (ref >60)
Glucose, Bld: 96 mg/dL (ref 70–99)
Potassium: 3.7 mmol/L (ref 3.5–5.1)
Sodium: 138 mmol/L (ref 135–145)

## 2019-12-24 LAB — CBC
HCT: 27.7 % — ABNORMAL LOW (ref 36.0–46.0)
Hemoglobin: 9 g/dL — ABNORMAL LOW (ref 12.0–15.0)
MCH: 30.1 pg (ref 26.0–34.0)
MCHC: 32.5 g/dL (ref 30.0–36.0)
MCV: 92.6 fL (ref 80.0–100.0)
Platelets: 226 10*3/uL (ref 150–400)
RBC: 2.99 MIL/uL — ABNORMAL LOW (ref 3.87–5.11)
RDW: 14.2 % (ref 11.5–15.5)
WBC: 9.1 10*3/uL (ref 4.0–10.5)
nRBC: 0 % (ref 0.0–0.2)

## 2019-12-24 MED ORDER — SODIUM BICARBONATE 650 MG PO TABS: 650.0000 mg | ORAL_TABLET | Freq: Two times a day (BID) | ORAL | 0 refills | Status: AC

## 2019-12-24 MED ORDER — ONDANSETRON 4 MG PO TBDP: 4.0000 mg | ORAL_TABLET | Freq: Three times a day (TID) | ORAL | 0 refills | Status: AC | PRN

## 2019-12-24 MED ORDER — POLYETHYLENE GLYCOL 3350 17 G PO PACK: 17.0000 g | PACK | Freq: Every day | ORAL | 0 refills | Status: AC | PRN

## 2019-12-24 MED ORDER — ASPIRIN 81 MG PO CHEW: 81.0000 mg | CHEWABLE_TABLET | Freq: Two times a day (BID) | ORAL | 0 refills | Status: AC

## 2019-12-24 MED ORDER — HYDROCODONE-ACETAMINOPHEN 5-325 MG PO TABS: 1.0000 | ORAL_TABLET | Freq: Four times a day (QID) | ORAL | 0 refills | Status: DC | PRN

## 2019-12-24 NOTE — Op Note (Signed)
Operative note  Patient: Robin Grant.  [name] MRN number [id number]  Date of operation: 12/22/2019  Preoperative diagnosis: Displaced left hip femoral neck fracture Postoperative diagnosis: Same  Procedure: Left total hip arthroplasty through direct anterior approach  Implants: DePuy Sectra scription acetabular component size 52, size 36+0 neutral polythene liner, size 11 Corial femoral component with standard offset, size 36+1.5 metal head ball  Surgeon: Vanita Panda. Magnus Ivan, MD Assistant: Rexene Edison, PA-C  Anesthesia: General  Blood loss: 200 to 250 cc  Complications: None  Indications: The patient is an 86 year old female who lives at home with her son.  She was admitted to the hospital yesterday after continued hip pain and inability to ambulate.  This was her left hip that was bothering her.  Apparently she may have fallen a few days before that.  She does have a history of falls recently.  She does have mild dementia.  She was found to have a displaced left hip femoral neck fracture.  Given the fact that she is experiencing pain and does ambulate, we have recommended a left hip hemiarthroplasty versus total hip arthroplasty.  I have talked to her son who is healthcare power of attorney in length in detail about her recommendations.  She has been graciously admitted and seen by the medicine service and cleared for surgery.  We do feel it is appropriate to proceed with surgery given the impact this will have on her quality of life.  The risk and benefits of surgery been discussed with the son in detail and informed consent is obtained  Procedure: After informed consent was obtained and the left hip was marked the patient was brought to the operating room.  General anesthesia was obtained while she was supine on the stretcher.  Traction boots were then placed on both her feet and she was placed supine on the Hana fracture table with a perineal post in place in both legs and inline skeletal  traction devices but no traction applied.  Her left operative hip was prepped and draped with DuraPrep and sterile drapes.  A timeout was called and she identified the correct patient correct left hip we then made an incision just inferior and posterior to the anterior superior iliac spine and carried this obliquely down the leg.  We dissected down to the tensor fascia lata muscle and the tensor fascia was divided longitudinally to proceed with a directed approach to the hip.  Identified and cauterized circumflex vessels.  The hip capsule was identified and opened in an L-shaped format finding a hemarthrosis consistent with a femoral neck fracture and you can see there was a complete displaced femoral neck fracture.  We placed Cobra retractors around the medial lateral femoral neck and then made a femoral neck cut with an oscillating saw just proximal to the lesser trochanter and distal to the femoral neck fracture we then removed remnants of the femoral neck and placed a corkscrew gun the femoral head and remove the femoral head in its entirety.  We then assessed the acetabulum and did find significant wear of the acetabulum so we felt it was more appropriate to proceed with a total hip arthroplasty.  We will clean the acetabulum of bony debris and remnants of the acetabular labrum and began reaming her regularization from a size 43 reamer going a stepwise agreement up to a size 52.  We were then able to place a size 52 sector grips and asked to have a component from [place] in appropriate position based  on assessing this radiographically under fluoroscopic guidance as well.  Once we felt this was in a secure position we placed a 36+0 neutral polythene liner.  Attention was then turned the femur.  With the leg externally rotated to 120 degrees we brought the leg down and under.  We placed a Mueller retractor medially and a Hohmann tractor behind the greater trochanter.  We released the lateral joint capsule and used  a box cutting osteotome to enter the femoral canal.  We then were able to broach with the Karaya broaching system from a size 8 point up to a size 11 with a size 11 and placed a trial standard offset femoral neck and first with a short -22 hip ball.  I was unable to reduce this and acetabulum and I felt this was due to overhang of the acetabular component.  We then remove the broach and I actually removed the acetabular component remove the polyethylene liner and then reamed to position this cup a little more medially.  I then was able to reposition the cup adequately that gave a good coverage and there was no overhang.  I placed another 36 of a 0 polythene liner.  We then were able to place a broach size 11 back into the femur and a 36+1.5 head ball and reduced this and asked to have been it was stable and we are pleased with leg length range of motion and offset based on mechanical and radiographic assessment.  We then dislocated the hip and removed the trial components.  I then placed the real Karaya femoral component size 11 standard offset and the real 36+1.5 metal head ball and again reduce this in the acetabulum it was stable we then irrigated soft tissue normal saline solution using pulsatile lavage.  We closed the joint capsule with interrupted #1 Ethibond suture followed by closing the tensor fascia with #1 Vicryl.  0 Vicryl was used to close the deep tissue and 2-0 Vicryl was used to close the subcutaneous tissue.  The skin was reapproximated staples.  Xeroform and an Aquacel dressing were applied.  The patient was taken off the Hana table, awakened, extubated and taken recovery in stable condition.  All final counts were correct and there were no complications noted.  Of note Ramonita Lab, PA-C assisted during the entire case and assistance description of facilitating all aspects of this case.  Thank you

## 2019-12-24 NOTE — Progress Notes (Signed)
Physical Therapy Treatment Patient Details Name: [place] MRN: [id number] DOB: 1931-04-25 Today's Date: 12/24/2019    History of Present Illness [place] is a 86 y.o. female with medical history significant for dementia, dyslipidemia, hypothyroidism, and CKD stage IIIb who presented to the ED with multiple falls over this past week. Found to have L femoral neck fx; now s/p L THA, WBAT    PT Comments    Pt pleasantly confused but able to tolerate LLE movement, gait and transfers with limited pain and denied need for medication. Pt initially hesitant to put weight on LLE and required increased time to achieve standing balance but able to progress gait and perform HEP. Will plan to attempt stairs next session.     Follow Up Recommendations  Home health PT;Supervision/Assistance - 24 hour     Equipment Recommendations  3in1 (PT)    Recommendations for Other Services       Precautions / Restrictions Precautions Precautions: Fall Restrictions LLE Weight Bearing: Weight bearing as tolerated    Mobility  Bed Mobility Overal bed mobility: Needs Assistance Bed Mobility: Supine to Sit     Supine to sit: Min guard     General bed mobility comments: guarding with increased time to transition to EOB  Transfers Overall transfer level: Needs assistance   Transfers: Sit to/from Stand Sit to Stand: Min assist         General transfer comment: Cues for hand placement and safety; assist to rise and initially put weight on LLE  Ambulation/Gait Ambulation/Gait assistance: Min guard Gait Distance (Feet): 30 Feet Assistive device: Rolling walker (2 wheeled) Gait Pattern/deviations: Trunk flexed;Step-through pattern;Decreased stride length;Decreased stance time - left   Gait velocity interpretation: 1.31 - 2.62 ft/sec, indicative of limited community ambulator General Gait Details: cues for sequence and safety with pt with decreased stance on LLE   Stairs             Wheelchair Mobility    Modified Rankin (Stroke Patients Only)       Balance Overall balance assessment: Needs assistance   Sitting balance-Leahy Scale: Fair     Standing balance support: Bilateral upper extremity supported   Standing balance comment: bil UE use on RW for gait                            Cognition Arousal/Alertness: Awake/alert Behavior During Therapy: WFL for tasks assessed/performed Overall Cognitive Status: No family/caregiver present to determine baseline cognitive functioning                                 General Comments: history of dementia, not oriented to place, time or situation. follows single step commands with increased time      Exercises General Exercises - Lower Extremity Long Arc Quad: AROM;Both;Seated;10 reps Hip ABduction/ADduction: AROM;Seated;Both;10 reps Hip Flexion/Marching: AROM;Both;10 reps;Seated    General Comments        Pertinent Vitals/Pain Faces Pain Scale: Hurts a little bit Pain Location: L hip with activity Pain Descriptors / Indicators: Grimacing;Guarding Pain Intervention(s): Limited activity within patient's tolerance;Monitored during session;Other (comment)(pt declined need for medication)    Home Living                      Prior Function            PT Goals (current goals can now be  found in the care plan section) Progress towards PT goals: Progressing toward goals    Frequency    Min 5X/week      PT Plan Current plan remains appropriate    Co-evaluation              AM-PAC PT "6 Clicks" Mobility   Outcome Measure  Help needed turning from your back to your side while in a flat bed without using bedrails?: A Little Help needed moving from lying on your back to sitting on the side of a flat bed without using bedrails?: A Little Help needed moving to and from a bed to a chair (including a wheelchair)?: A Little Help needed standing up from a chair  using your arms (e.g., wheelchair or bedside chair)?: A Little Help needed to walk in hospital room?: A Little Help needed climbing 3-5 steps with a railing? : A Lot 6 Click Score: 17    End of Session Equipment Utilized During Treatment: Gait belt Activity Tolerance: Patient tolerated treatment well Patient left: in chair;with call bell/phone within reach;with chair alarm set Nurse Communication: Mobility status;Weight bearing status PT Visit Diagnosis: Other abnormalities of gait and mobility (R26.89);Pain     Time: 1008-1020 PT Time Calculation (min) (ACUTE ONLY): 12 min  Charges:  $Gait Training: 8-22 mins                     Merryl Hacker, PT Acute Rehabilitation Services Pager: [phone] Office: [phone]    [place] 12/24/2019, 12:27 PM

## 2019-12-24 NOTE — Discharge Summary (Signed)
Physician Discharge Summary  EDISON WOLLSCHLAGER JJH:[id number] DOB: 23-Nov-1930 DOA: 12/21/2019  PCP: Asencion Noble, MD  Admit date: 12/21/2019 Discharge date: 12/24/2019  Admitted From: Home Disposition: Home  Recommendations for Outpatient Follow-up:  1. Follow up with PCP in 1-2 weeks 2. Please obtain BMP/CBC in one week 3. Follow-up with orthopedics in the next 2 weeks  Home Health: Home health PT Equipment/Devices:  Discharge Condition: Stable CODE STATUS: Full code Diet recommendation: Heart healthy  Brief/Interim Summary: DARBY FLEEMAN is a 86 year-old caucasian female with PMHx remarkable for  HLD, hypothyroidism, dementia, CKD stage 3, GERD, essential hypertension, who presented following multiple falls at home with difficulty ambulating.  In the ED, she was found to have an elevated BUN / creatinine of 44/2.02 respectively as well as a left femoral neck fracture.  Patient was transferred from [facility] to [facility] for operative management with orthopedics.  Discharge Diagnoses:  Active Problems:   Essential hypertension   Acute renal failure superimposed on stage 3 chronic kidney disease (HCC)   Hypothyroidism   Hip fracture (HCC)   Dementia ([place])  1. Left femoral neck fracture.  Patient was seen by Dr. Ninfa Linden orthopedics and underwent operative management.  She has been started on aspirin for DVT prophylaxis.  Pain appears to be reasonably controlled.  Seen by physical therapy with recommendations for home health PT. 2. Acute on chronic kidney disease stage IIIb.  Creatinine of 2.03 on admission.  She was treated with IV fluids and overall creatinine has improved to 1.6.  This appears to be more towards her baseline.  This can be followed as an outpatient. 3. Dementia.  Continue on Zoloft, Aricept.  She uses Ativan twice daily as needed 4. Elevated blood pressure.  This may been related to pain related issues.  With recommend following this is an outpatient and if high blood  pressures are persisting, can consider further therapy.  Currently, blood pressures running between 120-150. 5. Hypothyroidism.  Continue Synthroid  Discharge Instructions  Discharge Instructions    Diet - low sodium heart healthy   Complete by: As directed    Increase activity slowly   Complete by: As directed      Allergies as of 12/24/2019      Reactions   Hydrocodone Nausea And Vomiting   Codeine Nausea And Vomiting      Medication List    STOP taking these medications   ondansetron 4 MG tablet Commonly known as: ZOFRAN     TAKE these medications   aspirin 81 MG chewable tablet Chew 1 tablet (81 mg total) by mouth 2 (two) times daily.   donepezil 5 MG tablet Commonly known as: ARICEPT Take 5 mg by mouth daily.   HYDROcodone-acetaminophen 5-325 MG tablet Commonly known as: NORCO/VICODIN Take 1 tablet by mouth every 6 (six) hours as needed for moderate pain (pain score 4-6).   levothyroxine 88 MCG tablet Commonly known as: SYNTHROID Take 88 mcg by mouth daily.   LORazepam 0.5 MG tablet Commonly known as: ATIVAN Take 0.5-1 tablets by mouth 2 (two) times daily as needed for anxiety or sleep.   Melatonin 5 MG Tabs Take 1 tablet by mouth daily.   multivitamin capsule Take 1 capsule by mouth daily.   ondansetron 4 MG disintegrating tablet Commonly known as: Zofran ODT Take 1 tablet (4 mg total) by mouth every 8 (eight) hours as needed for nausea or vomiting.   polyethylene glycol 17 g packet Commonly known as: MIRALAX / GLYCOLAX  Take 17 g by mouth daily as needed for mild constipation.   sertraline 100 MG tablet Commonly known as: ZOLOFT Take 100 mg by mouth daily.   sodium bicarbonate 650 MG tablet Take 1 tablet (650 mg total) by mouth 2 (two) times daily.            Durable Medical Equipment  (From admission, onward)         Start     Ordered   12/22/19 1958  DME 3 n 1  Once     12/22/19 1957   12/22/19 1958  DME Walker rolling  Once     Question Answer Comment  Walker: With 5 Inch Wheels   Patient needs a walker to treat with the following condition Status post total replacement of left hip      12/22/19 1957         Follow-up Information    Kathryne HitchBlackman, Christopher Y, MD. Schedule an appointment as soon as possible for a visit in 2 week(s).   Specialty: Orthopedic Surgery Contact information: [address] [place] [address] [phone]        [facility]   Specialty: Home Health Services Why: [facility].  You will be notified by them within 24-48 hours after discharge from the hospital. Contact information: [address] [place] [address] [phone]          Allergies  Allergen Reactions  . Hydrocodone Nausea And Vomiting  . Codeine Nausea And Vomiting    Consultations:  Orthopedics   Procedures/Studies: DG Chest 1 View  Result Date: 12/21/2019 CLINICAL DATA:  Post fall. Left hip and knee pain after unwitnessed fall. EXAM: CHEST  1 VIEW COMPARISON:  Radiograph 04/06/2018 FINDINGS: The cardiomediastinal contours are unchanged. Mild aortic atherosclerosis and tortuosity. Subsegmental atelectasis or scarring at the left lung base. Pulmonary vasculature is normal. No consolidation, pleural effusion, or pneumothorax. No acute osseous abnormalities are seen. Bones are diffusely under mineralized. IMPRESSION: No acute abnormality. Aortic Atherosclerosis (ICD10-I70.0). Electronically Signed   By: Narda RutherfordMelanie  Sanford M.D.   On: 12/21/2019 12:36   DG Pelvis Portable  Result Date: 12/22/2019 CLINICAL DATA:  86 year old female status post total left hip arthroplasty. EXAM: PORTABLE PELVIS 1-2 VIEWS COMPARISON:  Left hip radiograph dated 12/21/2019. FINDINGS: There is a total left hip arthroplasty. The arthroplasty components appear intact and in anatomic alignment. There is no acute fracture or dislocation. The bones are  osteopenic. Postsurgical changes in the soft tissues of the left hip with soft tissue air and cutaneous staples. IMPRESSION: Status post left hip arthroplasty.  No immediate complication. Electronically Signed   By: Elgie CollardArash  Radparvar M.D.   On: 12/22/2019 19:48   DG Knee Complete 4 Views Left  Result Date: 12/21/2019 CLINICAL DATA:  Left knee pain after unwitnessed fall EXAM: LEFT KNEE - COMPLETE 4+ VIEW COMPARISON:  None. FINDINGS: Osseous demineralization. No evidence of fracture, dislocation, or joint effusion. Mild tricompartmental joint space narrowing. Chondrocalcinosis. Scattered vascular calcifications. Soft tissues within normal limits. IMPRESSION: Negative. Electronically Signed   By: Duanne GuessNicholas  Plundo D.O.   On: 12/21/2019 12:37   DG C-Arm 1-60 Min  Result Date: 12/22/2019 CLINICAL DATA:  Hip fracture EXAM: OPERATIVE left HIP (WITH PELVIS IF PERFORMED) 4 VIEWS TECHNIQUE: Fluoroscopic spot image(s) were submitted for interpretation post-operatively. COMPARISON:  12/21/2019 FINDINGS: Four low resolution intraoperative spot views of the left hip. Total fluoroscopy time was 39 seconds. The initial images demonstrate  a left femoral neck fracture. Subsequent placement of left hip replacement with normal alignment. IMPRESSION: Intraoperative fluoroscopic assistance provided during left hip replacement Electronically Signed   By: Jasmine Pang M.D.   On: 12/22/2019 19:31   DG HIP OPERATIVE UNILAT W OR W/O PELVIS LEFT  Result Date: 12/22/2019 CLINICAL DATA:  Hip fracture EXAM: OPERATIVE left HIP (WITH PELVIS IF PERFORMED) 4 VIEWS TECHNIQUE: Fluoroscopic spot image(s) were submitted for interpretation post-operatively. COMPARISON:  12/21/2019 FINDINGS: Four low resolution intraoperative spot views of the left hip. Total fluoroscopy time was 39 seconds. The initial images demonstrate a left femoral neck fracture. Subsequent placement of left hip replacement with normal alignment. IMPRESSION: Intraoperative  fluoroscopic assistance provided during left hip replacement Electronically Signed   By: Jasmine Pang M.D.   On: 12/22/2019 19:31   DG Hip Unilat W or Wo Pelvis 2-3 Views Left  Result Date: 12/21/2019 CLINICAL DATA:  Fall, left hip pain EXAM: DG HIP (WITH OR WITHOUT PELVIS) 2-3V LEFT COMPARISON:  None. FINDINGS: There is a left femoral neck fracture with varus angulation. No subluxation or dislocation. SI joints symmetric and unremarkable. Vascular calcifications noted. Rounded calcification in the pelvis, presumably fibroid. IMPRESSION: Left femoral neck fracture with varus angulation. Electronically Signed   By: Charlett Nose M.D.   On: 12/21/2019 12:33      Subjective: Denies any significant pain, shortness of breath, sitting up in chair  Discharge Exam: Vitals:   12/23/19 1957 12/24/19 0559 12/24/19 0755 12/24/19 1354  BP: 121/72 (!) 145/76 128/80 125/67  Pulse: 93 87 87 92  Resp: 18 17 18 17   Temp: 97.7 F (36.5 C) 98.1 F (36.7 C) 98.3 F (36.8 C) 98.3 F (36.8 C)  TempSrc: Oral Oral Oral Oral  SpO2: 100% 97% 98% 98%  Weight:      Height:        General: Pt is alert, awake, not in acute distress Cardiovascular: RRR, S1/S2 +, no rubs, no gallops Respiratory: CTA bilaterally, no wheezing, no rhonchi Abdominal: Soft, NT, ND, bowel sounds + Extremities: no edema, no cyanosis    The results of significant diagnostics from this hospitalization (including imaging, microbiology, ancillary and laboratory) are listed below for reference.     Microbiology: Recent Results (from the past 240 hour(s))  Respiratory Panel by RT PCR (Flu A&B, Covid) - Nasopharyngeal Swab     Status: None   Collection Time: 12/21/19  6:05 PM   Specimen: Nasopharyngeal Swab  Result Value Ref Range Status   SARS Coronavirus 2 by RT PCR NEGATIVE NEGATIVE Final    Comment: (NOTE) SARS-CoV-2 target nucleic acids are NOT DETECTED. The SARS-CoV-2 RNA is generally detectable in upper  respiratoy specimens during the acute phase of infection. The lowest concentration of SARS-CoV-2 viral copies this assay can detect is 131 copies/mL. A negative result does not preclude SARS-Cov-2 infection and should not be used as the sole basis for treatment or other patient management decisions. A negative result may occur with  improper specimen collection/handling, submission of specimen other than nasopharyngeal swab, presence of viral mutation(s) within the areas targeted by this assay, and inadequate number of viral copies (<131 copies/mL). A negative result must be combined with clinical observations, patient history, and epidemiological information. The expected result is Negative. Fact Sheet for Patients:  12/23/19 Fact Sheet for Healthcare Providers:  https://www.moore.com/ This test is not yet ap proved or cleared by the https://www.young.biz/ FDA and  has been authorized for detection and/or diagnosis of SARS-CoV-2 by FDA  under an Emergency Use Authorization (EUA). This EUA will remain  in effect (meaning this test can be used) for the duration of the COVID-19 declaration under Section 564(b)(1) of the Act, 21 U.S.C. section 360bbb-3(b)(1), unless the authorization is terminated or revoked sooner.    Influenza A by PCR NEGATIVE NEGATIVE Final   Influenza B by PCR NEGATIVE NEGATIVE Final    Comment: (NOTE) The Xpert Xpress SARS-CoV-2/FLU/RSV assay is intended as an aid in  the diagnosis of influenza from Nasopharyngeal swab specimens and  should not be used as a sole basis for treatment. Nasal washings and  aspirates are unacceptable for Xpert Xpress SARS-CoV-2/FLU/RSV  testing. Fact Sheet for Patients: https://www.moore.com/ Fact Sheet for Healthcare Providers: https://www.young.biz/ This test is not yet approved or cleared by the [place] FDA and  has been authorized for  detection and/or diagnosis of SARS-CoV-2 by  FDA under an Emergency Use Authorization (EUA). This EUA will remain  in effect (meaning this test can be used) for the duration of the  Covid-19 declaration under Section 564(b)(1) of the Act, 21  U.S.C. section 360bbb-3(b)(1), unless the authorization is  terminated or revoked. Performed at [facility], [address], [place], [place] [address]   Surgical pcr screen     Status: Abnormal   Collection Time: 12/21/19  9:55 PM   Specimen: Nasal Mucosa; Nasal Swab  Result Value Ref Range Status   MRSA, PCR NEGATIVE NEGATIVE Final   Staphylococcus aureus POSITIVE (A) NEGATIVE Final    Comment: (NOTE) The Xpert SA Assay (FDA approved for NASAL specimens in patients 15 years of age and older), is one component of a comprehensive surveillance program. It is not intended to diagnose infection nor to guide or monitor treatment.      Labs: BNP (last 3 results) No results for input(s): BNP in the last 8760 hours. Basic Metabolic Panel: Recent Labs  Lab 12/21/19 1246 12/22/19 0819 12/23/19 0150 12/24/19 0349  NA 137 138 138 138  K 4.0 3.7 4.6 3.7  CL 107 108 107 107  CO2 18* 18* 18* 19*  GLUCOSE 90 84 100* 96  BUN 44* 38* 29* 24*  CREATININE 2.02* 2.03* 1.83* 1.60*  CALCIUM 9.1 8.7* 8.7* 8.6*   Liver Function Tests: Recent Labs  Lab 12/21/19 1246 12/22/19 0819  AST 21 24  ALT 18 19  ALKPHOS 148* 120  BILITOT 2.0* 1.6*  PROT 6.6 5.3*  ALBUMIN 3.0* 2.3*   No results for input(s): LIPASE, AMYLASE in the last 168 hours. No results for input(s): AMMONIA in the last 168 hours. CBC: Recent Labs  Lab 12/21/19 1246 12/22/19 0819 12/23/19 0150 12/24/19 0349  WBC 6.8 7.0 11.6* 9.1  NEUTROABS 4.8 4.9 9.1*  --   HGB 13.2 11.1* 10.9* 9.0*  HCT 41.9 34.0* 33.8* 27.7*  MCV 94.4 91.9 94.4 92.6  PLT 251 247 263 226   Cardiac Enzymes: Recent Labs  Lab 12/21/19 1246  CKTOTAL 125   BNP: Invalid input(s): POCBNP CBG: No results  for input(s): GLUCAP in the last 168 hours. D-Dimer No results for input(s): DDIMER in the last 72 hours. Hgb A1c No results for input(s): HGBA1C in the last 72 hours. Lipid Profile No results for input(s): CHOL, HDL, LDLCALC, TRIG, CHOLHDL, LDLDIRECT in the last 72 hours. Thyroid function studies No results for input(s): TSH, T4TOTAL, T3FREE, THYROIDAB in the last 72 hours.  Invalid input(s): FREET3 Anemia work up No results for input(s): VITAMINB12, FOLATE, FERRITIN, TIBC, IRON, RETICCTPCT in the last  72 hours. Urinalysis    Component Value Date/Time   COLORURINE AMBER (A) 04/06/2018 1227   APPEARANCEUR CLOUDY (A) 04/06/2018 1227   LABSPEC 1.017 04/06/2018 1227   PHURINE 5.0 04/06/2018 1227   GLUCOSEU NEGATIVE 04/06/2018 1227   HGBUR NEGATIVE 04/06/2018 1227   BILIRUBINUR NEGATIVE 04/06/2018 1227   KETONESUR 5 (A) 04/06/2018 1227   PROTEINUR 30 (A) 04/06/2018 1227   NITRITE NEGATIVE 04/06/2018 1227   LEUKOCYTESUR TRACE (A) 04/06/2018 1227   Sepsis Labs Invalid input(s): PROCALCITONIN,  WBC,  LACTICIDVEN Microbiology Recent Results (from the past 240 hour(s))  Respiratory Panel by RT PCR (Flu A&B, Covid) - Nasopharyngeal Swab     Status: None   Collection Time: 12/21/19  6:05 PM   Specimen: Nasopharyngeal Swab  Result Value Ref Range Status   SARS Coronavirus 2 by RT PCR NEGATIVE NEGATIVE Final    Comment: (NOTE) SARS-CoV-2 target nucleic acids are NOT DETECTED. The SARS-CoV-2 RNA is generally detectable in upper respiratoy specimens during the acute phase of infection. The lowest concentration of SARS-CoV-2 viral copies this assay can detect is 131 copies/mL. A negative result does not preclude SARS-Cov-2 infection and should not be used as the sole basis for treatment or other patient management decisions. A negative result may occur with  improper specimen collection/handling, submission of specimen other than nasopharyngeal swab, presence of viral mutation(s) within  the areas targeted by this assay, and inadequate number of viral copies (<131 copies/mL). A negative result must be combined with clinical observations, patient history, and epidemiological information. The expected result is Negative. Fact Sheet for Patients:  https://www.moore.com/ Fact Sheet for Healthcare Providers:  https://www.young.biz/ This test is not yet ap proved or cleared by the [place] FDA and  has been authorized for detection and/or diagnosis of SARS-CoV-2 by FDA under an Emergency Use Authorization (EUA). This EUA will remain  in effect (meaning this test can be used) for the duration of the COVID-19 declaration under Section 564(b)(1) of the Act, 21 U.S.C. section 360bbb-3(b)(1), unless the authorization is terminated or revoked sooner.    Influenza A by PCR NEGATIVE NEGATIVE Final   Influenza B by PCR NEGATIVE NEGATIVE Final    Comment: (NOTE) The Xpert Xpress SARS-CoV-2/FLU/RSV assay is intended as an aid in  the diagnosis of influenza from Nasopharyngeal swab specimens and  should not be used as a sole basis for treatment. Nasal washings and  aspirates are unacceptable for Xpert Xpress SARS-CoV-2/FLU/RSV  testing. Fact Sheet for Patients: https://www.moore.com/ Fact Sheet for Healthcare Providers: https://www.young.biz/ This test is not yet approved or cleared by the [place] FDA and  has been authorized for detection and/or diagnosis of SARS-CoV-2 by  FDA under an Emergency Use Authorization (EUA). This EUA will remain  in effect (meaning this test can be used) for the duration of the  Covid-19 declaration under Section 564(b)(1) of the Act, 21  U.S.C. section 360bbb-3(b)(1), unless the authorization is  terminated or revoked. Performed at [facility], [address], [place], [place] [address]   Surgical pcr screen     Status: Abnormal   Collection Time: 12/21/19  9:55  PM   Specimen: Nasal Mucosa; Nasal Swab  Result Value Ref Range Status   MRSA, PCR NEGATIVE NEGATIVE Final   Staphylococcus aureus POSITIVE (A) NEGATIVE Final    Comment: (NOTE) The Xpert SA Assay (FDA approved for NASAL specimens in patients 33 years of age and older), is one component of a comprehensive surveillance program. It is not intended to  diagnose infection nor to guide or monitor treatment.      Time coordinating discharge:  SIGNED:   Erick Blinks, MD  [facility] 12/24/2019, 7:29 PM   If 7PM-7AM, please contact night-coverage [email or web address]

## 2019-12-24 NOTE — Progress Notes (Signed)
Occupational Therapy Treatment Patient Details Name: Robin Grant MRN: [id number] DOB: June 30, 1931 Today's Date: 12/24/2019    History of present illness Robin Grant is a 86 y.o. female with medical history significant for dementia, dyslipidemia, hypothyroidism, and CKD stage IIIb who presented to the ED with multiple falls over this past week. Found to have L femoral neck fx; now s/p L THA, WBAT   OT comments  Pt making good progress with functional goals. Pt's daughter in law present and very supportive. Session focused on ADL mobility using RW to bathroom for toilet transfers toileting, hygiene standing at sink and ADLs seated with caregiver education for ADL assist at current level of function. OT will continue to follow acutely  Follow Up Recommendations  Home health OT;Supervision/Assistance - 24 hour    Equipment Recommendations  None recommended by OT    Recommendations for Other Services      Precautions / Restrictions Precautions Precautions: Fall Restrictions Weight Bearing Restrictions: No LLE Weight Bearing: Weight bearing as tolerated       Mobility Bed Mobility Overal bed mobility: Needs Assistance Bed Mobility: Supine to Sit     Supine to sit: Min guard     General bed mobility comments: pt in recliner upon arrival  Transfers Overall transfer level: Needs assistance Equipment used: Rolling walker (2 wheeled) Transfers: Sit to/from Stand Sit to Stand: Min assist         General transfer comment: Cues for hand placement and safety    Balance Overall balance assessment: Needs assistance Sitting-balance support: No upper extremity supported;Feet supported Sitting balance-Leahy Scale: Fair     Standing balance support: Bilateral upper extremity supported;During functional activity Standing balance-Leahy Scale: Fair Standing balance comment: bil UE use on RW for gait                           ADL either performed or assessed with  clinical judgement   ADL Overall ADL's : Needs assistance/impaired     Grooming: Wash/dry hands;Wash/dry face;Min guard;Standing;Cueing for safety       Lower Body Bathing: Minimal assistance;Sit to/from stand;With caregiver independent assisting Lower Body Bathing Details (indicate cue type and reason): simulated     Lower Body Dressing: Minimal assistance;Sit to/from stand;With caregiver independent assisting   Toilet Transfer: Minimal assistance;Ambulation;RW;Comfort height toilet;Grab bars;Cueing for safety   Toileting- Clothing Manipulation and Hygiene: Minimal assistance;Sit to/from stand;With caregiver independent assisting       Functional mobility during ADLs: Minimal assistance;Rolling walker;Cueing for safety;Caregiver able to provide necessary level of assistance       Vision Baseline Vision/History: Wears glasses Wears Glasses: At all times Patient Visual Report: No change from baseline     Perception     Praxis      Cognition Arousal/Alertness: Awake/alert Behavior During Therapy: WFL for tasks assessed/performed Overall Cognitive Status: History of cognitive impairments - at baseline                                 General Comments: pt's daughter in law present and states that this is near baseline        Exercises General Exercises - Lower Extremity Long Arc Quad: AROM;Both;Seated;10 reps Hip ABduction/ADduction: AROM;Seated;Both;10 reps Hip Flexion/Marching: AROM;Both;10 reps;Seated   Shoulder Instructions       General Comments      Pertinent Vitals/ Pain       Pain  Assessment: Faces Faces Pain Scale: Hurts a little bit Pain Location: L hip with activity Pain Descriptors / Indicators: Grimacing;Guarding Pain Intervention(s): Monitored during session;Repositioned  Home Living                                          Prior Functioning/Environment              Frequency  Min 2X/week         Progress Toward Goals  OT Goals(current goals can now be found in the care plan section)  Progress towards OT goals: Progressing toward goals     Plan Discharge plan remains appropriate    Co-evaluation                 AM-PAC OT "6 Clicks" Daily Activity     Outcome Measure   Help from another person eating meals?: None Help from another person taking care of personal grooming?: A Little Help from another person toileting, which includes using toliet, bedpan, or urinal?: A Little Help from another person bathing (including washing, rinsing, drying)?: A Little Help from another person to put on and taking off regular upper body clothing?: A Little Help from another person to put on and taking off regular lower body clothing?: A Little 6 Click Score: 19    End of Session Equipment Utilized During Treatment: Gait belt;Rolling walker;Other (comment)(3 in 1)  OT Visit Diagnosis: Unsteadiness on feet (R26.81);Other abnormalities of gait and mobility (R26.89);Muscle weakness (generalized) (M62.81);History of falling (Z91.81);Other symptoms and signs involving cognitive function;Pain Pain - Right/Left: Left Pain - part of body: Hip   Activity Tolerance Patient tolerated treatment well;Other (comment)(c/o nasuea at end of session)   Patient Left     Nurse Communication          Time: [phone] OT Time Calculation (min): 27 min  Charges: OT General Charges $OT Visit: 1 Visit OT Treatments $Self Care/Home Management : 8-22 mins $Therapeutic Activity: 8-22 mins     Galen Manila 12/24/2019, 1:47 PM

## 2019-12-24 NOTE — Progress Notes (Signed)
A discharge packet was printed and provided to the patient.  Discharge instructions will be reviewed with the patient and her family.  The patient will be discharged to home with her son.  As written in case manager's note, the patient has needed equipment at home.  The patient is happy to be discharged today and her son Robin Grant was notified of discharge via phone.

## 2019-12-24 NOTE — Progress Notes (Signed)
Patient ID: Robin Grant, female   DOB: 1931-08-20, 86 y.o.   MRN: [id number] No acute changes post-operatively.  Left hip stable.  I did change her dressing at the bedside and her incision looks good.  Her vitals are stable.  Hgb is 9.0 (acute blood loss anemia from surgery but tolerating well.  Therapy notes are recommending home health with supervision (she does live with her son). Stable from Ortho standpoint.

## 2019-12-24 NOTE — Progress Notes (Signed)
Discharge instructions reviewed with Barry,Son. Discharged via wheelchair with son.

## 2019-12-27 DIAGNOSIS — N179 Acute kidney failure, unspecified: Secondary | ICD-10-CM | POA: Diagnosis not present

## 2019-12-27 DIAGNOSIS — I129 Hypertensive chronic kidney disease with stage 1 through stage 4 chronic kidney disease, or unspecified chronic kidney disease: Secondary | ICD-10-CM | POA: Diagnosis not present

## 2019-12-27 DIAGNOSIS — F419 Anxiety disorder, unspecified: Secondary | ICD-10-CM | POA: Diagnosis not present

## 2019-12-27 DIAGNOSIS — F0390 Unspecified dementia without behavioral disturbance: Secondary | ICD-10-CM | POA: Diagnosis not present

## 2019-12-27 DIAGNOSIS — S72002D Fracture of unspecified part of neck of left femur, subsequent encounter for closed fracture with routine healing: Secondary | ICD-10-CM | POA: Diagnosis not present

## 2019-12-27 DIAGNOSIS — E039 Hypothyroidism, unspecified: Secondary | ICD-10-CM | POA: Diagnosis not present

## 2019-12-27 DIAGNOSIS — N1832 Chronic kidney disease, stage 3b: Secondary | ICD-10-CM | POA: Diagnosis not present

## 2019-12-27 DIAGNOSIS — K5790 Diverticulosis of intestine, part unspecified, without perforation or abscess without bleeding: Secondary | ICD-10-CM | POA: Diagnosis not present

## 2019-12-27 DIAGNOSIS — E782 Mixed hyperlipidemia: Secondary | ICD-10-CM | POA: Diagnosis not present

## 2019-12-27 DIAGNOSIS — R1319 Other dysphagia: Secondary | ICD-10-CM | POA: Diagnosis not present

## 2019-12-28 DIAGNOSIS — E039 Hypothyroidism, unspecified: Secondary | ICD-10-CM | POA: Diagnosis not present

## 2019-12-28 DIAGNOSIS — I129 Hypertensive chronic kidney disease with stage 1 through stage 4 chronic kidney disease, or unspecified chronic kidney disease: Secondary | ICD-10-CM | POA: Diagnosis not present

## 2019-12-28 DIAGNOSIS — E782 Mixed hyperlipidemia: Secondary | ICD-10-CM | POA: Diagnosis not present

## 2019-12-28 DIAGNOSIS — F0390 Unspecified dementia without behavioral disturbance: Secondary | ICD-10-CM | POA: Diagnosis not present

## 2019-12-28 DIAGNOSIS — K5790 Diverticulosis of intestine, part unspecified, without perforation or abscess without bleeding: Secondary | ICD-10-CM | POA: Diagnosis not present

## 2019-12-28 DIAGNOSIS — F419 Anxiety disorder, unspecified: Secondary | ICD-10-CM | POA: Diagnosis not present

## 2019-12-28 DIAGNOSIS — N1832 Chronic kidney disease, stage 3b: Secondary | ICD-10-CM | POA: Diagnosis not present

## 2019-12-28 DIAGNOSIS — R1319 Other dysphagia: Secondary | ICD-10-CM | POA: Diagnosis not present

## 2019-12-28 DIAGNOSIS — S72002D Fracture of unspecified part of neck of left femur, subsequent encounter for closed fracture with routine healing: Secondary | ICD-10-CM | POA: Diagnosis not present

## 2019-12-30 DIAGNOSIS — F419 Anxiety disorder, unspecified: Secondary | ICD-10-CM | POA: Diagnosis not present

## 2019-12-30 DIAGNOSIS — F0390 Unspecified dementia without behavioral disturbance: Secondary | ICD-10-CM | POA: Diagnosis not present

## 2019-12-30 DIAGNOSIS — E782 Mixed hyperlipidemia: Secondary | ICD-10-CM | POA: Diagnosis not present

## 2019-12-30 DIAGNOSIS — R1319 Other dysphagia: Secondary | ICD-10-CM | POA: Diagnosis not present

## 2019-12-30 DIAGNOSIS — I129 Hypertensive chronic kidney disease with stage 1 through stage 4 chronic kidney disease, or unspecified chronic kidney disease: Secondary | ICD-10-CM | POA: Diagnosis not present

## 2019-12-30 DIAGNOSIS — E039 Hypothyroidism, unspecified: Secondary | ICD-10-CM | POA: Diagnosis not present

## 2019-12-30 DIAGNOSIS — S72002D Fracture of unspecified part of neck of left femur, subsequent encounter for closed fracture with routine healing: Secondary | ICD-10-CM | POA: Diagnosis not present

## 2019-12-30 DIAGNOSIS — K5790 Diverticulosis of intestine, part unspecified, without perforation or abscess without bleeding: Secondary | ICD-10-CM | POA: Diagnosis not present

## 2019-12-30 DIAGNOSIS — N1832 Chronic kidney disease, stage 3b: Secondary | ICD-10-CM | POA: Diagnosis not present

## 2020-01-06 DIAGNOSIS — E039 Hypothyroidism, unspecified: Secondary | ICD-10-CM | POA: Diagnosis not present

## 2020-01-06 DIAGNOSIS — F0390 Unspecified dementia without behavioral disturbance: Secondary | ICD-10-CM | POA: Diagnosis not present

## 2020-01-06 DIAGNOSIS — N1832 Chronic kidney disease, stage 3b: Secondary | ICD-10-CM | POA: Diagnosis not present

## 2020-01-06 DIAGNOSIS — I129 Hypertensive chronic kidney disease with stage 1 through stage 4 chronic kidney disease, or unspecified chronic kidney disease: Secondary | ICD-10-CM | POA: Diagnosis not present

## 2020-01-06 DIAGNOSIS — R1319 Other dysphagia: Secondary | ICD-10-CM | POA: Diagnosis not present

## 2020-01-06 DIAGNOSIS — F419 Anxiety disorder, unspecified: Secondary | ICD-10-CM | POA: Diagnosis not present

## 2020-01-06 DIAGNOSIS — S72002D Fracture of unspecified part of neck of left femur, subsequent encounter for closed fracture with routine healing: Secondary | ICD-10-CM | POA: Diagnosis not present

## 2020-01-06 DIAGNOSIS — E782 Mixed hyperlipidemia: Secondary | ICD-10-CM | POA: Diagnosis not present

## 2020-01-06 DIAGNOSIS — K5790 Diverticulosis of intestine, part unspecified, without perforation or abscess without bleeding: Secondary | ICD-10-CM | POA: Diagnosis not present

## 2020-01-11 DIAGNOSIS — E782 Mixed hyperlipidemia: Secondary | ICD-10-CM | POA: Diagnosis not present

## 2020-01-11 DIAGNOSIS — E039 Hypothyroidism, unspecified: Secondary | ICD-10-CM | POA: Diagnosis not present

## 2020-01-11 DIAGNOSIS — N1832 Chronic kidney disease, stage 3b: Secondary | ICD-10-CM | POA: Diagnosis not present

## 2020-01-11 DIAGNOSIS — R1319 Other dysphagia: Secondary | ICD-10-CM | POA: Diagnosis not present

## 2020-01-11 DIAGNOSIS — F419 Anxiety disorder, unspecified: Secondary | ICD-10-CM | POA: Diagnosis not present

## 2020-01-11 DIAGNOSIS — F0390 Unspecified dementia without behavioral disturbance: Secondary | ICD-10-CM | POA: Diagnosis not present

## 2020-01-11 DIAGNOSIS — I129 Hypertensive chronic kidney disease with stage 1 through stage 4 chronic kidney disease, or unspecified chronic kidney disease: Secondary | ICD-10-CM | POA: Diagnosis not present

## 2020-01-11 DIAGNOSIS — K5790 Diverticulosis of intestine, part unspecified, without perforation or abscess without bleeding: Secondary | ICD-10-CM | POA: Diagnosis not present

## 2020-01-11 DIAGNOSIS — S72002D Fracture of unspecified part of neck of left femur, subsequent encounter for closed fracture with routine healing: Secondary | ICD-10-CM | POA: Diagnosis not present

## 2020-01-13 ENCOUNTER — Ambulatory Visit (INDEPENDENT_AMBULATORY_CARE_PROVIDER_SITE_OTHER): Payer: Medicare HMO | Admitting: Physician Assistant

## 2020-01-13 ENCOUNTER — Encounter: Payer: Self-pay | Admitting: Physician Assistant

## 2020-01-13 ENCOUNTER — Other Ambulatory Visit: Payer: Self-pay

## 2020-01-13 DIAGNOSIS — N183 Chronic kidney disease, stage 3 unspecified: Secondary | ICD-10-CM | POA: Diagnosis not present

## 2020-01-13 DIAGNOSIS — Z96642 Presence of left artificial hip joint: Secondary | ICD-10-CM

## 2020-01-13 DIAGNOSIS — S7292XA Unspecified fracture of left femur, initial encounter for closed fracture: Secondary | ICD-10-CM | POA: Diagnosis not present

## 2020-01-13 DIAGNOSIS — F0390 Unspecified dementia without behavioral disturbance: Secondary | ICD-10-CM | POA: Diagnosis not present

## 2020-01-13 DIAGNOSIS — M84459A Pathological fracture, hip, unspecified, initial encounter for fracture: Secondary | ICD-10-CM | POA: Diagnosis not present

## 2020-01-13 DIAGNOSIS — R110 Nausea: Secondary | ICD-10-CM | POA: Diagnosis not present

## 2020-01-13 MED ORDER — HYDROCODONE-ACETAMINOPHEN 5-325 MG PO TABS: 1.0000 | ORAL_TABLET | Freq: Four times a day (QID) | ORAL | 0 refills | Status: AC | PRN

## 2020-01-13 NOTE — Progress Notes (Signed)
Office Visit Note   Patient: Robin Grant           Date of Birth: 25-Feb-1931           MRN: [id number] Visit Date: 01/13/2020              Requested by: [name], MD [address] [place],  [place] [address] PCP: [name], MD   Assessment & Plan: Visit Diagnoses:  1. Status post total replacement of left hip     Plan: Discussed with her family member that she should be walking for physical therapy.  Discussed that the wound get wet in the shower.  No submersion of the wound.  Scar tissue mobilization encouraged.  We will see her back in 3 weeks to see how she is doing overall.  Questions encouraged and answered at length.  Staples were removed Steri-Strips applied.  She can go back on a regular 81 mg aspirin.  Follow-Up Instructions: Return in about 3 weeks (around 02/03/2020).   Orders:  No orders of the defined types were placed in this encounter.  No orders of the defined types were placed in this encounter.     Procedures: No procedures performed   Clinical Data: No additional findings.   Subjective: Chief Complaint  Patient presents with  . Left Hip - Routine Post Op    HPI Robin Grant is a 86 year old female who comes in today now 3 weeks status post left total hip arthroplasty secondary to displaced left hip femoral neck fracture.  She does have dementia.  Presents today with her son's fianc who is her main caregiver.  Her son's fianc states that patient had no fevers chills chest pain or shortness of breath.  She does state that she complains at the end of the day of left hip pain. Review of Systems See HPI  Objective: Vital Signs: There were no vitals taken for this visit.  Physical Exam Constitutional:      Appearance: She is not ill-appearing or diaphoretic.  Pulmonary:     Effort: Pulmonary effort is normal.  Neurological:     Mental Status: She is alert.     Ortho Exam Patient ambulates with a rolling walker with slightly  unsteady gait.  Left hip surgical incisions well approximated with staples no signs of infection or wound dehiscence.  Left calf is supple nontender.  Good range of motion left hip without any evidence of pain. Specialty Comments:  No specialty comments available.  Imaging: No results found.   PMFS History: Patient Active Problem List   Diagnosis Date Noted  . Dementia (HCC) 12/24/2019  . Hip fracture (HCC) 12/21/2019  . Heat stroke and sunstroke 04/06/2018  . Acute lower UTI 04/06/2018  . Dehydration 12/03/2017  . Hypothyroidism 12/03/2017  . Acidosis 12/03/2017  . Acute renal failure superimposed on stage 3 chronic kidney disease (HCC) 12/02/2017  . Left sided chest pain 07/19/2017  . Mixed hyperlipidemia 07/19/2017  . Rectal bleeding 07/26/2013  . Diverticulosis 07/26/2013  . Benign hypertension 07/26/2013  . Chronic kidney disease, stage III (moderate) 07/26/2013  . Left sided abdominal pain 05/03/2011  . Laryngopharyngeal reflux (LPR) 05/03/2011  . Esophageal dysphagia 05/03/2011  . CONSTIPATION 02/15/2009  . Hyperlipidemia 02/14/2009  . Essential hypertension 02/14/2009   Past Medical History:  Diagnosis Date  . Anxiety   . Chronic constipation   . Dementia (HCC)   . HTN (hypertension)   . Hyperlipidemia   . Hypothyroidism   . Insomnia  Family History  Problem Relation Age of Onset  . Breast cancer Mother        deceased from MI  . Stroke Father        MI too  . Colon cancer Maternal Aunt   . Rheumatic fever Sister 14       open heart surgery age 70, deceased age 38  . Lung cancer Sister   . Heart attack Sister   . Kidney disease Sister        born with one kidney, required transplant    Past Surgical History:  Procedure Laterality Date  . APPENDECTOMY    . COLONOSCOPY  2007   Dr. Gala Romney, pancolonic diverticula.  . COLONOSCOPY N/A 07/28/2013   Procedure: COLONOSCOPY;  Surgeon: Danie Binder, MD;  Location: [facility];  Service: Endoscopy;   Laterality: N/A;  PLEASE SCHEDULE AT 3 PM PER DR. FIELDS   . ESOPHAGOGASTRODUODENOSCOPY  05/21/2011   Dr. Gala Romney: normal tubular esophagus, s/p empiric dilation with 49 F, innocent appearing AVM, small hiatal hernia  . GIVENS CAPSULE STUDY N/A 07/30/2013   Procedure: GIVENS CAPSULE STUDY;  Surgeon: Daneil Dolin, MD;  Location: [facility];  Service: Endoscopy;  Laterality: N/A;  . HIP ARTHROPLASTY Left 12/22/2019   Procedure: TOTAL HIP ARTHROPLASTY;  Surgeon: Mcarthur Rossetti, MD;  Location: [facility];  Service: Orthopedics;  Laterality: Left;  . PORT-A-CATH REMOVAL  2008  . right ankle surgery  2007   osteomyelitis  . TOTAL HIP ARTHROPLASTY Left 12/22/2019  . TUBAL LIGATION     Social History   Occupational History  . Occupation:  retired    Fish farm manager: RETIRED    Comment: [employer]  Tobacco Use  . Smoking status: Former Research scientist (life sciences)  . Smokeless tobacco: Never Used  Substance and Sexual Activity  . Alcohol use: No  . Drug use: No  . Sexual activity: Not Currently

## 2020-01-15 DIAGNOSIS — S72002D Fracture of unspecified part of neck of left femur, subsequent encounter for closed fracture with routine healing: Secondary | ICD-10-CM | POA: Diagnosis not present

## 2020-01-15 DIAGNOSIS — F419 Anxiety disorder, unspecified: Secondary | ICD-10-CM | POA: Diagnosis not present

## 2020-01-15 DIAGNOSIS — I129 Hypertensive chronic kidney disease with stage 1 through stage 4 chronic kidney disease, or unspecified chronic kidney disease: Secondary | ICD-10-CM | POA: Diagnosis not present

## 2020-01-15 DIAGNOSIS — N1832 Chronic kidney disease, stage 3b: Secondary | ICD-10-CM | POA: Diagnosis not present

## 2020-01-15 DIAGNOSIS — K5790 Diverticulosis of intestine, part unspecified, without perforation or abscess without bleeding: Secondary | ICD-10-CM | POA: Diagnosis not present

## 2020-01-15 DIAGNOSIS — R1319 Other dysphagia: Secondary | ICD-10-CM | POA: Diagnosis not present

## 2020-01-15 DIAGNOSIS — E039 Hypothyroidism, unspecified: Secondary | ICD-10-CM | POA: Diagnosis not present

## 2020-01-15 DIAGNOSIS — E782 Mixed hyperlipidemia: Secondary | ICD-10-CM | POA: Diagnosis not present

## 2020-01-15 DIAGNOSIS — F0390 Unspecified dementia without behavioral disturbance: Secondary | ICD-10-CM | POA: Diagnosis not present

## 2020-01-18 ENCOUNTER — Other Ambulatory Visit (HOSPITAL_COMMUNITY): Payer: Self-pay | Admitting: Internal Medicine

## 2020-01-18 DIAGNOSIS — Z780 Asymptomatic menopausal state: Secondary | ICD-10-CM

## 2020-01-21 DIAGNOSIS — K5790 Diverticulosis of intestine, part unspecified, without perforation or abscess without bleeding: Secondary | ICD-10-CM | POA: Diagnosis not present

## 2020-01-21 DIAGNOSIS — N1832 Chronic kidney disease, stage 3b: Secondary | ICD-10-CM | POA: Diagnosis not present

## 2020-01-21 DIAGNOSIS — E039 Hypothyroidism, unspecified: Secondary | ICD-10-CM | POA: Diagnosis not present

## 2020-01-21 DIAGNOSIS — F0390 Unspecified dementia without behavioral disturbance: Secondary | ICD-10-CM | POA: Diagnosis not present

## 2020-01-21 DIAGNOSIS — S72002D Fracture of unspecified part of neck of left femur, subsequent encounter for closed fracture with routine healing: Secondary | ICD-10-CM | POA: Diagnosis not present

## 2020-01-21 DIAGNOSIS — R1319 Other dysphagia: Secondary | ICD-10-CM | POA: Diagnosis not present

## 2020-01-21 DIAGNOSIS — I129 Hypertensive chronic kidney disease with stage 1 through stage 4 chronic kidney disease, or unspecified chronic kidney disease: Secondary | ICD-10-CM | POA: Diagnosis not present

## 2020-01-21 DIAGNOSIS — E782 Mixed hyperlipidemia: Secondary | ICD-10-CM | POA: Diagnosis not present

## 2020-01-21 DIAGNOSIS — F419 Anxiety disorder, unspecified: Secondary | ICD-10-CM | POA: Diagnosis not present

## 2020-02-03 ENCOUNTER — Ambulatory Visit: Payer: Medicare HMO | Admitting: Physician Assistant

## 2021-09-06 DIAGNOSIS — I10 Essential (primary) hypertension: Secondary | ICD-10-CM | POA: Diagnosis not present

## 2021-09-06 DIAGNOSIS — E039 Hypothyroidism, unspecified: Secondary | ICD-10-CM | POA: Diagnosis not present

## 2021-10-27 DIAGNOSIS — I469 Cardiac arrest, cause unspecified: Secondary | ICD-10-CM | POA: Diagnosis not present

## 2021-10-27 DIAGNOSIS — R404 Transient alteration of awareness: Secondary | ICD-10-CM | POA: Diagnosis not present

## 2021-11-08 DIAGNOSIS — 419620001 Death: Secondary | SNOMED CT | POA: Diagnosis not present

## 2021-11-08 DEATH — deceased
